# Patient Record
Sex: Female | Born: 1955 | Race: Black or African American | Hispanic: No | Marital: Married | Smoking: Never smoker
Health system: Southern US, Community
[De-identification: ages and names within clinical notes are randomized; demographics above are authoritative.]

## PROBLEM LIST (undated history)

## (undated) DIAGNOSIS — E785 Hyperlipidemia, unspecified: Secondary | ICD-10-CM

## (undated) DIAGNOSIS — I10 Essential (primary) hypertension: Secondary | ICD-10-CM

## (undated) DIAGNOSIS — M1990 Unspecified osteoarthritis, unspecified site: Secondary | ICD-10-CM

## (undated) HISTORY — PX: OTHER SURGICAL HISTORY: SHX169

## (undated) HISTORY — PX: CYST EXCISION: SHX5701

## (undated) HISTORY — PX: REPLACEMENT TOTAL HIP W/  RESURFACING IMPLANTS: SUR1222

---

## 2010-07-06 ENCOUNTER — Ambulatory Visit: Payer: Self-pay | Admitting: Diagnostic Radiology

## 2010-07-06 ENCOUNTER — Emergency Department (HOSPITAL_BASED_OUTPATIENT_CLINIC_OR_DEPARTMENT_OTHER): Admission: EM | Admit: 2010-07-06 | Discharge: 2010-07-06 | Payer: Self-pay | Admitting: Emergency Medicine

## 2010-07-06 IMAGING — CT CT HEAD W/O CM
1 series · 16 of 30 positions shown, 20 images · non-contrast
Comparison: None.

CLINICAL DATA: Dizziness.  Myalgias.

CT HEAD WITHOUT CONTRAST
TECHNIQUE: Contiguous axial images were obtained from the base of
the skull through the vertex without contrast.

[Series 2: head 4.8 h37s · axial · 0.45mm/px · z∈[-113,+27]mm · 16 of 32 slices shown, 20 images]
[im 2/32  brain]
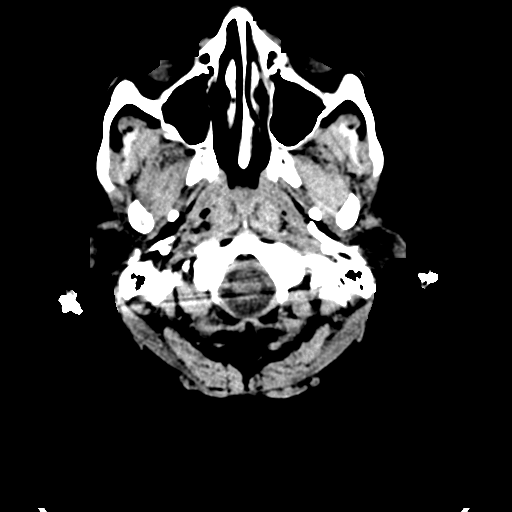
[im 2/32  bone]
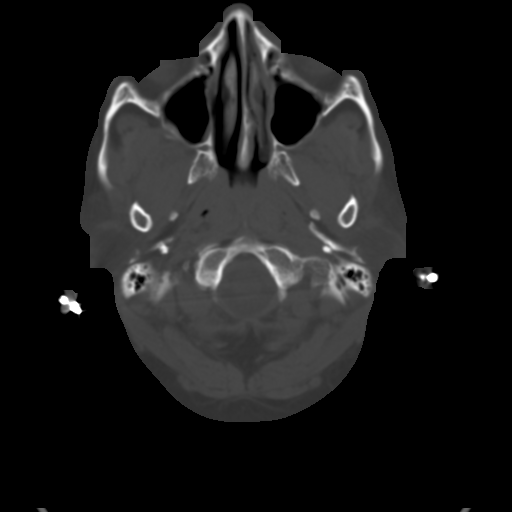
[im 4/32  brain]
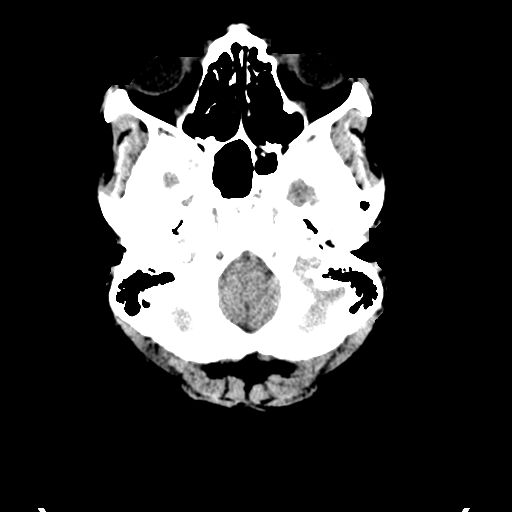
[im 6/32  brain]
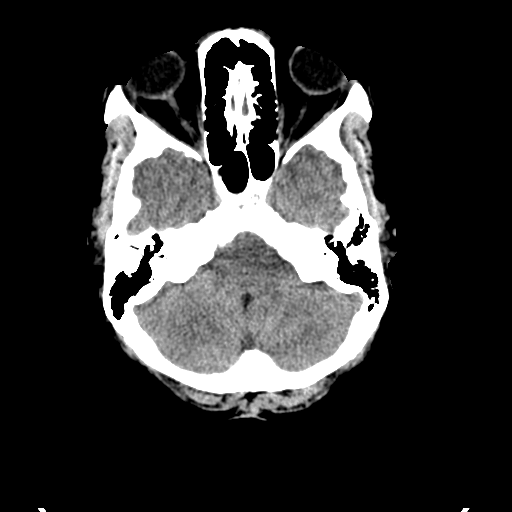
[im 8/32  brain]
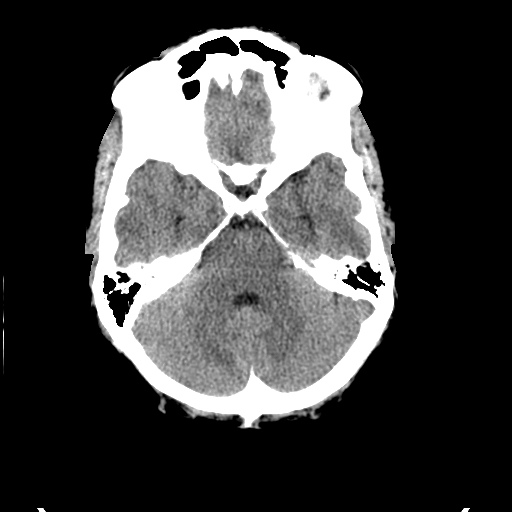
[im 9/32  brain]
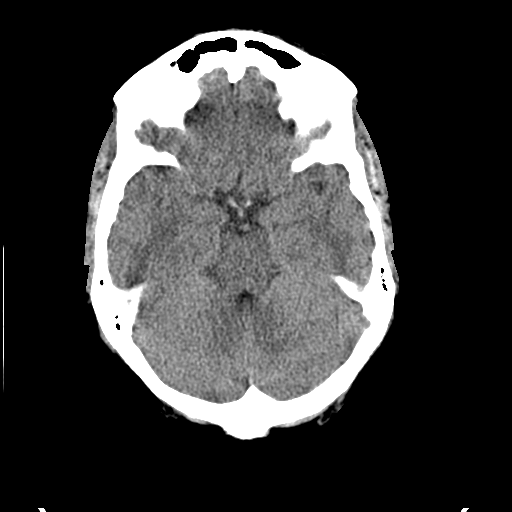
[im 9/32  bone]
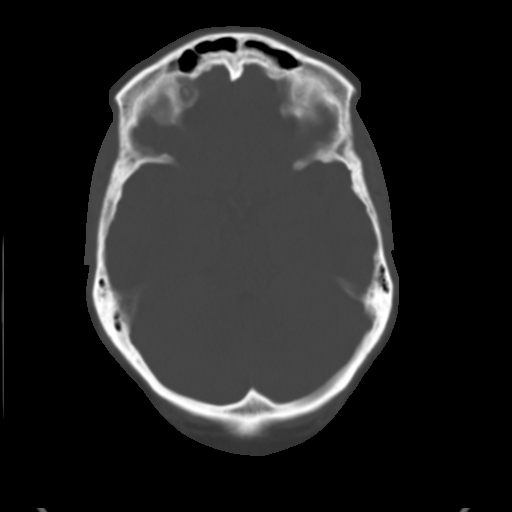
[im 11/32  brain]
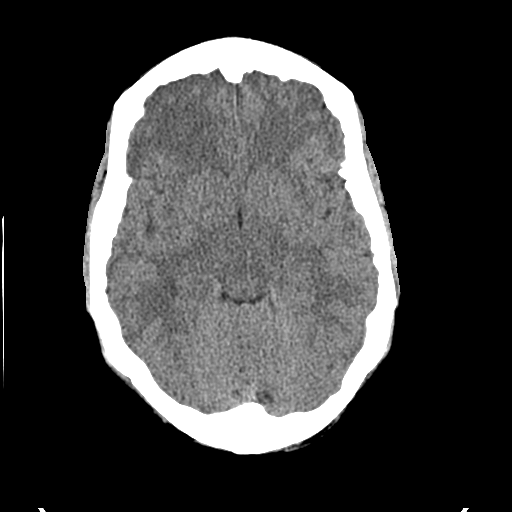
[im 13/32  brain]
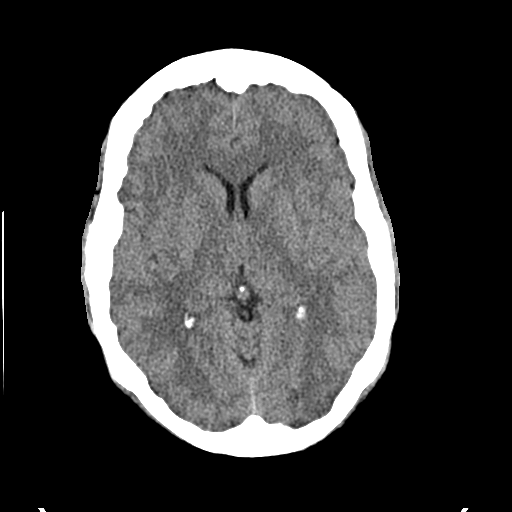
[im 15/32  brain]
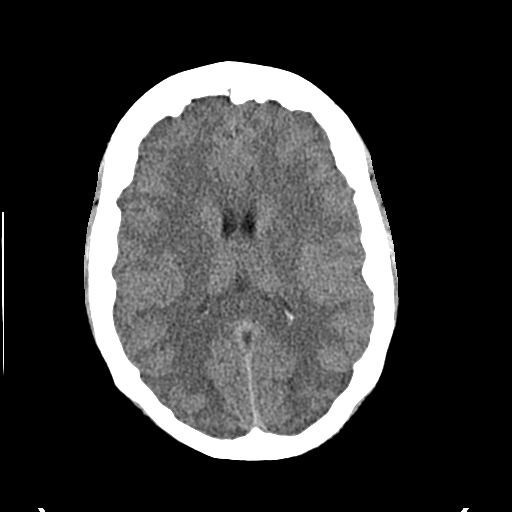
[im 17/32  brain]
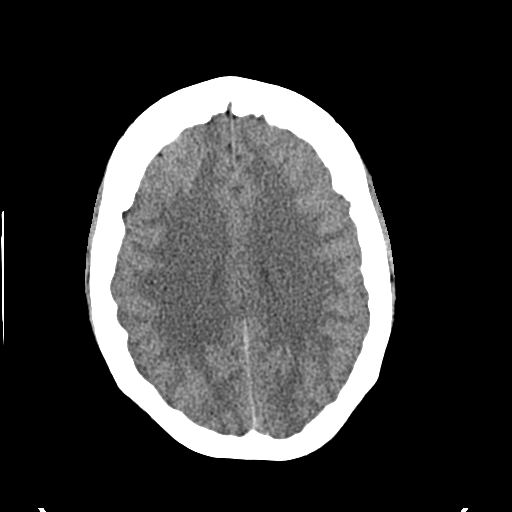
[im 17/32  bone]
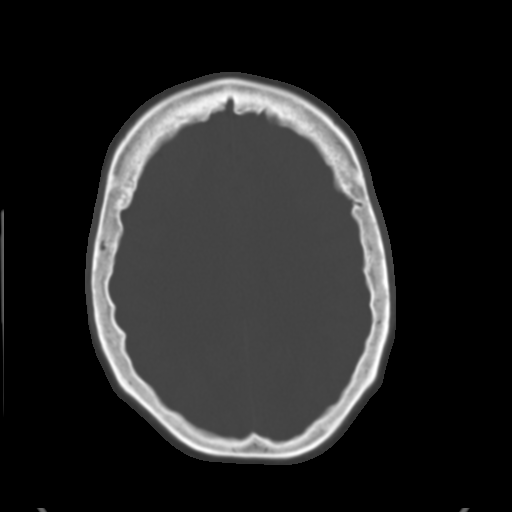
[im 19/32  brain]
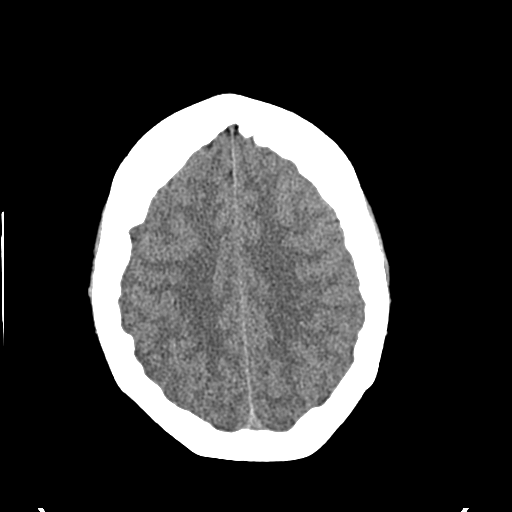
[im 21/32  brain]
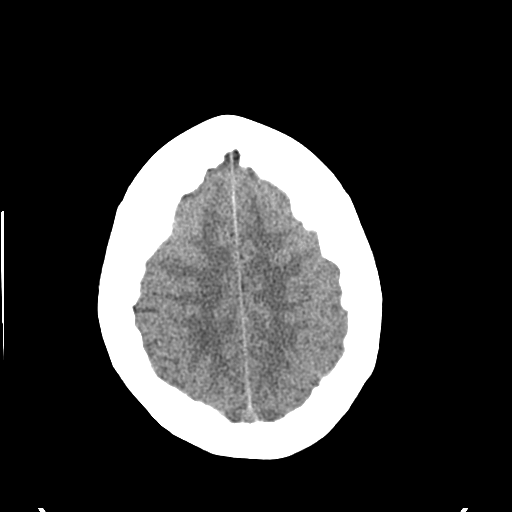
[im 23/32  brain]
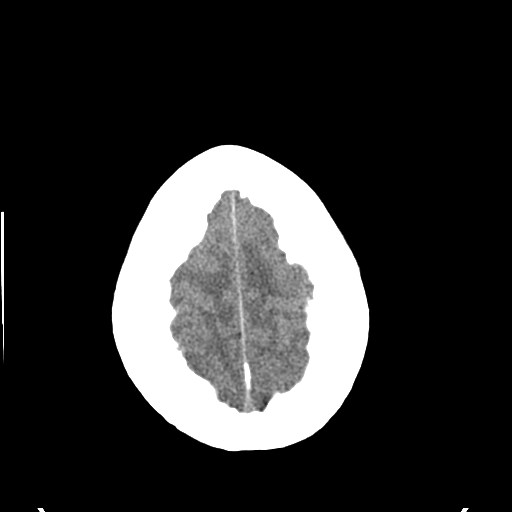
[im 24/32  brain]
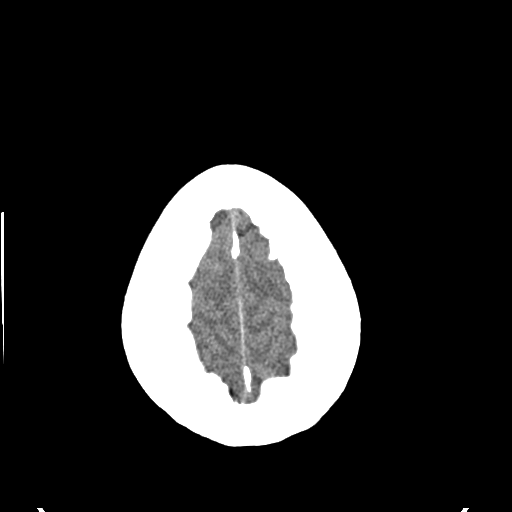
[im 24/32  bone]
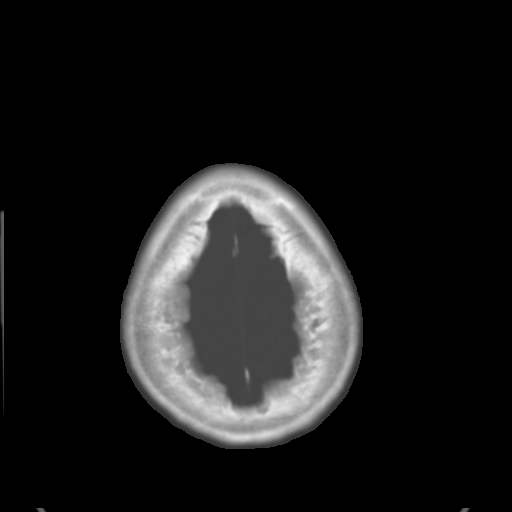
[im 26/32  brain]
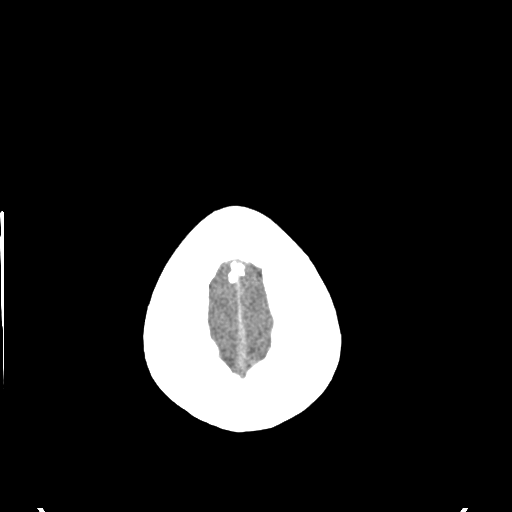
[im 28/32  brain]
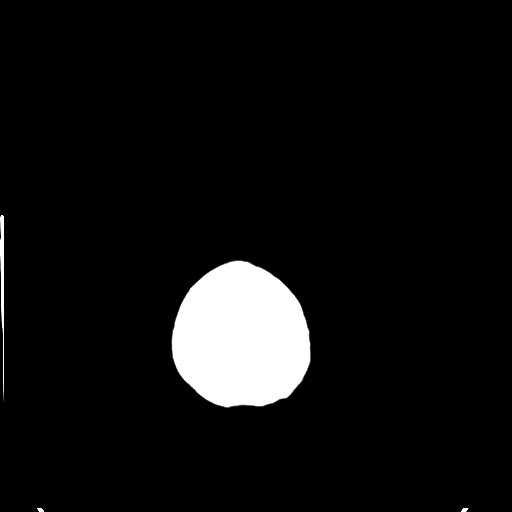
[im 30/32  brain]
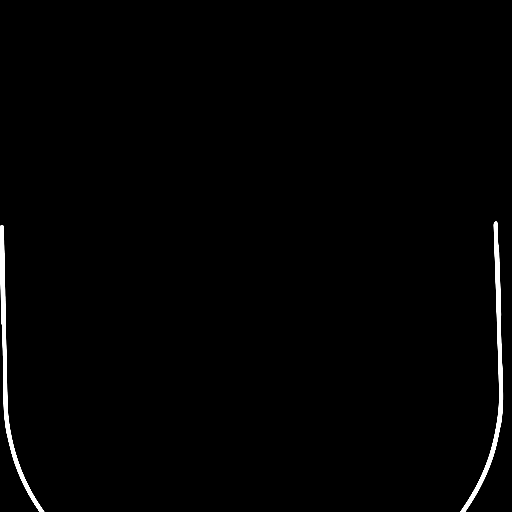

[16 of 30 positions shown; findings below may reference images not displayed]

FINDINGS: Normal appearing cerebral hemispheres and posterior fossa
structures.  Normal size and position of the ventricles.  No
intracranial hemorrhage, mass lesion or CT evidence of acute
infarction.  Mild right maxillary sinus mucosal thickening with a
maximum thickness of 3.0 mm.
IMPRESSION: Mild chronic right maxillary sinusitis.  Otherwise, normal
examination.

## 2010-11-05 LAB — DIFFERENTIAL
Eosinophils Relative: 2 % (ref 0–5)
Lymphocytes Relative: 47 % — ABNORMAL HIGH (ref 12–46)
Lymphs Abs: 4 10*3/uL (ref 0.7–4.0)
Monocytes Absolute: 0.9 10*3/uL (ref 0.1–1.0)
Monocytes Relative: 11 % (ref 3–12)
Neutro Abs: 3.3 10*3/uL (ref 1.7–7.7)

## 2010-11-05 LAB — BASIC METABOLIC PANEL
Chloride: 109 mEq/L (ref 96–112)
GFR calc non Af Amer: >60 mL/min (ref >60)
Potassium: 4.4 mEq/L (ref 3.5–5.1)
Sodium: 145 mEq/L (ref 135–145)

## 2010-11-05 LAB — URINALYSIS, ROUTINE W REFLEX MICROSCOPIC
Bilirubin Urine: NEGATIVE
Leukocytes, UA: NEGATIVE
Nitrite: NEGATIVE
Specific Gravity, Urine: 1.017 (ref 1.005–1.030)
Urobilinogen, UA: 0.2 mg/dL (ref 0.0–1.0)
pH: 6 (ref 5.0–8.0)

## 2010-11-05 LAB — CBC
HCT: 38.1 % (ref 36.0–46.0)
Hemoglobin: 12.8 g/dL (ref 12.0–15.0)
MCV: 90.2 fL (ref 78.0–100.0)
WBC: 8.4 10*3/uL (ref 4.0–10.5)

## 2010-11-05 LAB — POCT CARDIAC MARKERS
CKMB, poc: 1.4 ng/mL (ref 1.0–8.0)
Troponin i, poc: <0.05 ng/mL (ref 0.00–0.09)

## 2010-11-05 LAB — URINE MICROSCOPIC-ADD ON

## 2014-07-13 ENCOUNTER — Non-Acute Institutional Stay (SKILLED_NURSING_FACILITY): Payer: Medicare HMO | Admitting: Internal Medicine

## 2014-07-13 DIAGNOSIS — K5900 Constipation, unspecified: Secondary | ICD-10-CM

## 2014-07-13 DIAGNOSIS — I10 Essential (primary) hypertension: Secondary | ICD-10-CM

## 2014-07-13 DIAGNOSIS — M009 Pyogenic arthritis, unspecified: Secondary | ICD-10-CM

## 2014-07-13 DIAGNOSIS — M67331 Transient synovitis, right wrist: Secondary | ICD-10-CM

## 2014-07-13 DIAGNOSIS — M67339 Transient synovitis, unspecified wrist: Secondary | ICD-10-CM | POA: Insufficient documentation

## 2014-07-13 DIAGNOSIS — M1611 Unilateral primary osteoarthritis, right hip: Secondary | ICD-10-CM

## 2014-07-13 NOTE — Progress Notes (Signed)
Patient ID: Olivia Barnes, female   DOB: 02/08/1956, 58 y.o.   MRN: [id number]    [facility]  Chief Complaint  Patient presents with  . New Admit To SNF   No Known Allergies  HPI 58 y/o female pt is here for STR after hospital admission from 07/05/14-07/12/14 with right hip OA. She underwent right hip replacement on 07/05/14. It got infected and she had removal of infected hip hardware with placement of antibiotic pacer on 07/08/14. She also had synovitis of extensor tendons of right wrist and underwent tenosynovectomy of the fourth dorsal compartment on 07/08/14.  ID was consulted for septic arthritis and she had picc line placed. Her wrist culture grew staph aureus sensitive to cefazolin and she will need it for 6 weeks. She is seen in her room. She is in no distress and denies any complaints. She has PMH of HTN and obesity  Review of Systems  Constitutional: Negative for fever, chills, diaphoresis.  HENT: Negative for congestion.   Eyes: Negative for blurred vision, double vision and discharge.  Respiratory: Negative for cough, sputum production, shortness of breath and wheezing.   Cardiovascular: Negative for chest pain, palpitations, orthopnea and leg swelling.  Gastrointestinal: Negative for heartburn, nausea, vomiting, abdominal pain, diarrhea. Has constipation. mentions miralax helped her at home Genitourinary: Negative for dysuria, urgency, frequency and flank pain.  Musculoskeletal: Negative for back pain, falls Skin: Negative for itching and rash.  Neurological: Negative for dizziness, tingling, focal weakness and headaches.  Psychiatric/Behavioral: Negative for depression and memory loss. The patient is not nervous/anxious.    PMH- HTN    Medication List       This list is accurate as of: 07/13/14  6:59 PM.  Always use your most recent med list.               ceFAZolin 1 g injection  Commonly known as:  ANCEF  2 g by Other  route every 8 (eight) hours.     hydrochlorothiazide 12.5 MG capsule  Commonly known as:  MICROZIDE  Take 12.5 mg by mouth daily.     metoprolol succinate 50 MG 24 hr tablet  Commonly known as:  TOPROL-XL  Take 50 mg by mouth daily. Take with or immediately following a meal.     PERCOCET 5-325 MG per tablet  Generic drug:  oxyCODONE-acetaminophen  Take 1-2 tablets by mouth every 4 (four) hours as needed for severe pain.     warfarin 7.5 MG tablet  Commonly known as:  COUMADIN  Take 7.5 mg by mouth daily.       Physical exam BP 118/65 mmHg  Pulse 78  Temp(Src) 98.2 F (36.8 C)  Resp 18  SpO2 98%  General- adult morbid female in no acute distress Head- atraumatic, normocephalic Eyes- PERRLA, EOMI, no pallor, no icterus, no discharge Neck- no lymphadenopathy Mouth- normal mucus membrane Cardiovascular- normal s1,s2, no murmurs, normal distal pulses Respiratory- bilateral clear to auscultation, no wheeze, no rhonchi, no crackles Abdomen- bowel sounds present, soft, non tender Musculoskeletal- able to move all 4 extremities, right hip ROM limited, right leg trace edema Neurological- no focal deficit Skin- warm and dry, 4 sutures in right wrist, dressing clean, dressing on right hip clean and dry Psychiatry- alert and oriented to person, place and time, normal mood and affect   Labs 07/12/14 hb 8.6, hct 27.2, wbc 9.5, inr 1.66  Assessment/plan  Septic arthritis Continue cefazolin for total of 6 weeks. Has f/u with  ID. Monitor cbc with diff and temp curve  Right hip OA S/p right hip arthroplasty. Will have her work with physical therapy and occupational therapy team to help with gait training and muscle strengthening exercises.fall precautions. Skin care. Encourage to be out of bed. Has f/u with orthopedics Dr Linton Rump.She is NWB to RLE. On coumadin for dvt prophylaxis. Continue percocet prn for pain.  HTN Stable, continue hctz 12.5 mg daily and toprol xl 50 mg  daily  Right wrist synovitis S/p tenosynovectomy. Has suture in place, no signs of infection, continue prn pain med and to follow with ortho  Constipation Add miralax 17 g po qhs prn   Labs- cbc, cmp, esr, crp

## 2014-08-31 ENCOUNTER — Other Ambulatory Visit: Payer: Self-pay | Admitting: *Deleted

## 2014-08-31 MED ORDER — OXYCODONE-ACETAMINOPHEN 5-325 MG PO TABS: ORAL_TABLET | ORAL | Status: DC

## 2014-08-31 NOTE — Telephone Encounter (Signed)
[facility] 

## 2014-09-01 ENCOUNTER — Non-Acute Institutional Stay (SKILLED_NURSING_FACILITY): Payer: Medicare HMO | Admitting: Adult Health

## 2014-09-01 ENCOUNTER — Encounter: Payer: Self-pay | Admitting: Adult Health

## 2014-09-01 DIAGNOSIS — I10 Essential (primary) hypertension: Secondary | ICD-10-CM

## 2014-09-01 DIAGNOSIS — M009 Pyogenic arthritis, unspecified: Secondary | ICD-10-CM

## 2014-09-01 DIAGNOSIS — M1611 Unilateral primary osteoarthritis, right hip: Secondary | ICD-10-CM

## 2014-09-01 DIAGNOSIS — Z7901 Long term (current) use of anticoagulants: Secondary | ICD-10-CM

## 2014-09-01 DIAGNOSIS — M67331 Transient synovitis, right wrist: Secondary | ICD-10-CM

## 2014-09-01 DIAGNOSIS — K5900 Constipation, unspecified: Secondary | ICD-10-CM

## 2014-09-01 NOTE — Progress Notes (Signed)
Patient ID: Olivia Barnes, female   DOB: Apr 17, 1956, 59 y.o.   MRN: [id number]   09/01/2014  Facility:  Nursing Home Location:  [place] Room Number: 1204-P LEVEL OF CARE:  SNF (31)   Chief Complaint  Patient presents with  . Discharge Note    Septic arthritis, osteoarthritis S/P right total hip replacement, transient synovitis of the right wrist, hypertension, constipation and long-term use of anticoagulant    HISTORY OF PRESENT ILLNESS:  This is a 59 year old female who is for discharge home with home health PT and nursing. She has been admitted to [facility] on 03/10/14 from [facility].  She has PMH of hypertension. She has Osteoarthritis S/P right hip replacement. It was was complicated with infection and had removal of infected hip hardware with placement of antibiotic spacer on 07/08/14. Another complication is synovitis is of the extensor tendons of the right wrist S/P extensor tenosynovectomy of the 4th dorsal compartment, right wrist and arthrotomy of the right wrist joint drainage on 07/08/14. Patient was admitted to this facility for short-term rehabilitation after the patient's recent hospitalization.  Patient has completed SNF rehabilitation and therapy has cleared the patient for discharge.  PAST MEDICAL HISTORY:   Hypertension   CURRENT MEDICATIONS: Reviewed per MAR/see medication list  No Known Allergies   REVIEW OF SYSTEMS:  GENERAL: no change in appetite, no fatigue, no weight changes, no fever, chills or weakness RESPIRATORY: no cough, SOB, DOE, wheezing, hemoptysis CARDIAC: no chest pain, or palpitations GI: no abdominal pain, diarrhea, constipation, heart burn, nausea or vomiting  PHYSICAL EXAMINATION  GENERAL: no acute distress, obese SKIN:  Right hip surgical wound is dry, healed; right wrist surgical wound is healed EYES: conjunctivae normal, sclerae normal, normal eye lids NECK: supple, trachea midline,  no neck masses, no thyroid tenderness, no thyromegaly LYMPHATICS: no LAN in the neck, no supraclavicular LAN RESPIRATORY: breathing is even & unlabored, BS CTAB CARDIAC: RRR, no murmur,no extra heart sounds, trace edema RLE GI: abdomen soft, normal BS, no masses, no tenderness, no hepatomegaly, no splenomegaly EXTREMITIES: able to move all 4 extremities; has double lumen PICC on left upper arm PSYCHIATRIC: the patient is alert & oriented to person, affect & behavior appropriate  LABS/RADIOLOGY: 08/28/14  WBC 4.9 hemoglobin 11.2 hematocrit 37.1 MCV 93.2 ESR 13 sodium 140 potassium 4.0 glucose 98 BUN 14 creatinine 0.6 calcium 9.7 total protein 7.0 albumin 3.6 globulin 3.4 total bilirubin 0.4 ALP 78 AST 50 ALT 2 CRP negative 08/22/14  WBC 5.0 hemoglobin 10.9 hematocrit 36.3 MCV 93.1 ESR 73 sodium 140 potassium 4.1 glucose 102 BUN 14 creatinine 0.6 calcium 9.5 total protein 6.9 albumin 3.4 globulin 3.5 ALP 90 AST 15 ALT 4 CRP negative 07/12/14 hb 8.6, hct 27.2, wbc 9.5, inr 1.66   ASSESSMENT/PLAN:  Septic arthritis -  continue cefazolin until today, 09/01/14, then discontinue PICC Right hip Osteoarthritis S/P right hip arthroplasty - for home health PT and nursing; continue Percocet 5/325 mg 1-2 tabs by mouth every 4 hours when necessary; follow-up. [facility] infectious disease Right wrist synovitis -  S/P tenosynovectomy; continue Percocet when necessary Hypertension - well controlled; continue Toprol-XL 50 mg by mouth daily and Microzide 12.5 mg by mouth daily Long-term use of anticoagulant - INR 2.2 - 50; continue Coumadin 7 mg by mouth daily and INR checked on 09/05/14 Constipation - continue MiraLAX when necessary   I have filled out patient's discharge paperwork and written prescriptions.  Patient will receive  home health PT and Nursing.  Total discharge time: Greater than 30 minutes  Discharge time involved coordination of the discharge process with social worker, nursing staff and  therapy department. Medical justification for home health services verified.     [facility], NP [employer] [phone]

## 2014-10-20 ENCOUNTER — Non-Acute Institutional Stay (SKILLED_NURSING_FACILITY): Payer: No Typology Code available for payment source | Admitting: Internal Medicine

## 2014-10-20 DIAGNOSIS — I10 Essential (primary) hypertension: Secondary | ICD-10-CM | POA: Diagnosis not present

## 2014-10-20 DIAGNOSIS — K5900 Constipation, unspecified: Secondary | ICD-10-CM | POA: Diagnosis not present

## 2014-10-20 DIAGNOSIS — M1990 Unspecified osteoarthritis, unspecified site: Secondary | ICD-10-CM

## 2014-10-20 DIAGNOSIS — M1611 Unilateral primary osteoarthritis, right hip: Secondary | ICD-10-CM | POA: Insufficient documentation

## 2014-10-20 DIAGNOSIS — D62 Acute posthemorrhagic anemia: Secondary | ICD-10-CM

## 2014-10-20 NOTE — Progress Notes (Addendum)
Patient ID: Olivia Barnes, female   DOB: [birth year], 59 y.o.   MRN: [id number]     [facility]   PCP: No primary care provider on file.  Code Status: full code  No Known Allergies  Chief Complaint  Patient presents with  . 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 she underwent revision of right total hip replacement. She is seen in her room today. She denies any concerns. Her current pain regimen is helping her pain. Her last bowel movement was yesterday. No new concern from staff.  Review of Systems:  Constitutional: Negative for fever, chills, malaise/fatigue and diaphoresis.  HENT: Negative for headache, congestion, nasal discharge Eyes: Negative for eye pain, blurred vision, double vision and discharge.  Respiratory: Negative for cough, shortness of breath and wheezing.   Cardiovascular: Negative for chest pain, palpitations, leg swelling.  Gastrointestinal: Negative for heartburn, nausea, vomiting, abdominal pain Genitourinary: Negative for dysuria, urgency, frequency, hematuria, incontinence, nocturia and flank pain.  Musculoskeletal: Negative for back pain, falls Skin: Negative for itching, rash.  Neurological: Negative for dizziness, tingling, focal weakness Psychiatric/Behavioral: Negative for depression  PMH: HTN  Surgical history: arm surgery  Social History: denies smoking and alcohol intake  Family history: heart disease  Medications: Patient's Medications  New Prescriptions   No medications on file  Previous Medications   HYDROCHLOROTHIAZIDE (MICROZIDE) 12.5 MG CAPSULE    Take 12.5 mg by mouth daily.   METOPROLOL SUCCINATE (TOPROL-XL) 50 MG 24 HR TABLET    Take 50 mg by mouth daily. Take with or immediately following a meal.   OXYCODONE-ACETAMINOPHEN  (PERCOCET) 5-325 MG PER TABLET    Take one tablet by mouth every 4 hours as needed for moderate pain; Take two tablets by mouth every 4 hours as needed for severe pain. Do not exceed 4gm of Tylenol in 24 hours   POLYETHYLENE GLYCOL (MIRALAX / GLYCOLAX) PACKET    Take 17 g by mouth daily.   WARFARIN (COUMADIN) 7.5 MG TABLET    Take 15 mg by mouth daily.   Modified Medications   No medications on file  Discontinued Medications   CEFAZOLIN (ANCEF) 1 G INJECTION    2 g by Other route every 8 (eight) hours.     Physical Exam: Filed Vitals:   10/20/14 0943  BP: 118/64  Pulse: 80  Temp: 99.1 F (37.3 C)  Resp: 18  Weight: 210 lb (95.255 kg)  SpO2: 98%    General- adult female, obese, in no acute distress Head- normocephalic, atraumatic Throat- moist mucus membrane Eyes- PERRLA, EOMI, no pallor, no icterus, no discharge, normal conjunctiva, normal sclera Neck- no cervical lymphadenopathy Cardiovascular- normal s1,s2, no murmurs, palpable dorsalis pedis and radial pulses, right leg edema Respiratory- bilateral clear to auscultation, no wheeze, no rhonchi, no crackles, no use of accessory muscles Abdomen- bowel sounds present, soft, non tender Musculoskeletal- able to move all 4 extremities, right hip ROM limited Neurological- no focal deficit Skin- warm and dry, right hip surgical incision healing well, staples in place and dressing clean and dry Nails- hypertrophy, ingrown Psychiatry- alert and oriented to person, place and time, normal mood and affect  Labs 10/19/14 wbc 7.2, hb 7.7, hct 24.6, plt 134, na 140, k 4.2, bun 15, cr 0.67, ca 8.2  Assessment/Plan  Right hip arthritis S/p right hip  revision arthroplasty. Currently NWB in RLE. Will have patient work with PT/OT as tolerated to regain strength and restore function.  Fall precautions are in place. Continue percocet 5-325 1 tab q4h prn pain. Continue coumadin for dvt prophylaxis, inr today 1.6, continue coumadin 15 mg daily with  recheck of inr 10/23/14  HTN bp reading stable, continue hctz 12.5 mg daily and toprol xl 50 mg daily, monitor bp  Constipation On miralax daily, add senna s 2 tab daily and prn dulcolax suppository  Anemia likely post op from blood loss anemia, monitor h&h   Goals of care: short term rehabilitation   Labs/tests ordered: cbc, cmp 10/23/14  Family/ staff Communication: reviewed care plan with patient and nursing supervisor    Oneal GroutMAHIMA Naol Ontiveros, MD  [facility] [phone] (Monday-Friday 8 am - 5 pm) [phone] (afterhours)

## 2014-10-27 ENCOUNTER — Encounter: Payer: Self-pay | Admitting: Adult Health

## 2014-10-27 ENCOUNTER — Non-Acute Institutional Stay (SKILLED_NURSING_FACILITY): Payer: No Typology Code available for payment source | Admitting: Adult Health

## 2014-10-27 DIAGNOSIS — Z7901 Long term (current) use of anticoagulants: Secondary | ICD-10-CM

## 2014-10-27 DIAGNOSIS — Z9660 Presence of unspecified orthopedic joint implant: Secondary | ICD-10-CM

## 2014-10-27 DIAGNOSIS — Z96649 Presence of unspecified artificial hip joint: Secondary | ICD-10-CM

## 2014-10-27 NOTE — Progress Notes (Signed)
Patient ID: Olivia Barnes, female   DOB: 01/30/1956, 59 y.o.   MRN: [id number] Subjective:     Indication: S/P right hip revision arthroplasty Bleeding signs/symptoms: None Thromboembolic signs/symptoms: None  Missed Coumadin doses: This week - 4 due to supratherapeutic INR Medication changes: no Dietary changes: no Bacterial/viral infection: no Other concerns: no     Review of Systems A comprehensive review of systems was negative.   Objective:    INR Today: 2.3 Current dose:   on hold X 4 days  Assessment:    Therapeutic INR for goal of 2-3   Plan:    1. New dose: Coumadin 10 mg PO Q D   2. Next INR:  10/31/14

## 2014-11-13 ENCOUNTER — Non-Acute Institutional Stay (SKILLED_NURSING_FACILITY): Payer: No Typology Code available for payment source | Admitting: Adult Health

## 2014-11-13 DIAGNOSIS — D62 Acute posthemorrhagic anemia: Secondary | ICD-10-CM | POA: Diagnosis not present

## 2014-11-13 DIAGNOSIS — K5900 Constipation, unspecified: Secondary | ICD-10-CM

## 2014-11-13 DIAGNOSIS — M009 Pyogenic arthritis, unspecified: Secondary | ICD-10-CM | POA: Diagnosis not present

## 2014-11-13 DIAGNOSIS — I10 Essential (primary) hypertension: Secondary | ICD-10-CM | POA: Diagnosis not present

## 2014-11-21 ENCOUNTER — Encounter: Payer: Self-pay | Admitting: Adult Health

## 2014-11-21 NOTE — Progress Notes (Signed)
Patient ID: Olivia Barnes, female   DOB: 07/17/1956, 59 y.o.   MRN: [id number]   11/13/14  Facility:  Nursing Home Location:  [facility] Room Number: 507-P LEVEL OF CARE:  SNF (31)   Chief Complaint  Patient presents with  . Discharge Note    Infected failed right hip replacement S/P right total hip revision arthroplasty, hypertension, anemia and constipation    HISTORY OF PRESENT ILLNESS:  This is a 59 year old female who is for discharge home and will have outpatient rehabilitation. She has PMH of hypertension. [facility]. 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 She had undergone revision of right total hip replacement.  Patient was admitted to this facility for short-term rehabilitation after the patient's recent hospitalization.  Patient has completed SNF rehabilitation and therapy has cleared the patient for discharge.   PAST MEDICAL HISTORY:  Hypertension  CURRENT MEDICATIONS: Reviewed per MAR/see medication list  No Known Allergies   REVIEW OF SYSTEMS:  GENERAL: no change in appetite, no fatigue, no weight changes, no fever, chills or weakness RESPIRATORY: no cough, SOB, DOE, wheezing, hemoptysis CARDIAC: no chest pain, edema or palpitations GI: no abdominal pain, diarrhea, constipation, heart burn, nausea or vomiting  PHYSICAL EXAMINATION  GENERAL: no acute distress, obese EYES: conjunctivae normal, sclerae normal, normal eye lids NECK: supple, trachea midline, no neck masses, no thyroid tenderness, no thyromegaly LYMPHATICS: no LAN in the neck, no supraclavicular LAN RESPIRATORY: breathing is even & unlabored, BS CTAB CARDIAC: RRR, no murmur,no extra heart sounds, no edema GI: abdomen soft, normal BS, no masses, no tenderness, no hepatomegaly, no splenomegaly EXTREMITIES:  Able to move 4 extremities; + right knee  immobilizer PSYCHIATRIC: the is alert & oriented to person, affect & behavior appropriate  LABS/RADIOLOGY: 10/23/14  WBC 5.4 hemoglobin 8.3 hematocrit 27.1 MCV 88.9 sodium 140 potassium 4.3 glucose 89 BUN 15 creatinine 0.66 alkaline phosphatase 85 SGOT 16 SGPT 9 total protein 6.3 albumin 3.3 calcium 8.8 10/19/14 wbc 7.2, hb 7.7, hct 24.6, plt 134, na 140, k 4.2, bun 15, cr 0.67, ca 8.2  ASSESSMENT/PLAN:  Infected failed right hip replacement S/P revision of right total hip replacement - for outpatient rehabilitation; continue Percocet 5/325 mg 1 tab by mouth every 4 hours when necessary for pain; and coumadin was recently discontinued Hypertension - well controlled; continue microzide 12.5 mg 1 capsule by mouth daily and Toprol-XL 50 mg by mouth daily Anemia - hemoglobin 8.3; stable Constipation - continue MiraLAX 17 g last 4-6 ounces liquid by mouth daily and senna S2 tabs by mouth daily    I have filled out patient's discharge paperwork and written prescriptions.  Patient will have outpatient rehabilitation.  Total discharge time: Less than 30 minutes  Discharge time involved coordination of the discharge process with Child psychotherapistsocial worker, nursing staff and therapy department.    [facility],Trisa Cranor, NP [facility] [phone]

## 2015-02-28 ENCOUNTER — Other Ambulatory Visit: Payer: Self-pay | Admitting: Adult Health

## 2015-03-07 ENCOUNTER — Other Ambulatory Visit: Payer: Self-pay | Admitting: Adult Health

## 2015-03-08 ENCOUNTER — Other Ambulatory Visit: Payer: Self-pay | Admitting: Adult Health

## 2016-03-19 NOTE — Progress Notes (Signed)
[name] [address] [place], [place] [address] Phone: [phone] Subjective:    I'm seeing this patient by the request  of:    CC: hip pain  EFE:[id number]  Olivia Barnes is a 60 y.o. female coming in with complaint of right-sided hip pain. 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 This is an 2016 over a year ago. Patient unfortunately continues to have right hip pain. Patient states that it seems comfortable lower back and radiate down the leg. Symptoms goes to the knee. Patient states it seems will be worse with increasing activity. Patient states he can be very painful at night and has not been sleeping well for a long time. Mild weakness.      No past medical history on file. No past surgical history on file. Social History   Social History  . Marital status: Married    Spouse name: N/A  . Number of children: N/A  . Years of education: N/A   Social History Main Topics  . Smoking status: Unknown If Ever Smoked  . Smokeless tobacco: None  . Alcohol use None  . Drug use: Unknown  . Sexual activity: Not Asked   Other Topics Concern  . None   Social History Narrative  . None   No Known Allergies No family history on file. no family hx.    Past medical history, social, surgical and family history all reviewed in electronic medical record.  No pertanent information unless stated regarding to the chief complaint.   Review of Systems: No headache, visual changes, nausea, vomiting, diarrhea, constipation, dizziness, abdominal pain, skin rash, fevers, chills, night sweats, weight loss, swollen lymph nodes, chest pain, shortness of breath, mood changes.   Objective  Blood pressure 128/82, pulse 61, height 5\' 3"  (1.6 m), weight 238 lb (108 kg), SpO2 99 %.  General: No apparent distress  alert and oriented x3 mood and affect normal, dressed appropriately.  HEENT: Pupils equal, extraocular movements intact  Respiratory: Patient's speak in full sentences and does not appear short of breath  Cardiovascular: No lower extremity edema, non tender, no erythema  Skin: Warm dry intact with no signs of infection or rash on extremities or on axial skeleton.  Abdomen: Soft nontender  Neuro: Cranial nerves II through XII are intact, neurovascularly intact in all extremities with 2+ DTRs and 2+ pulses.  Lymph: No lymphadenopathy of posterior or anterior cervical chain or axillae bilaterally.  Gait normal with good balance and coordination.  MSK:  Non tender with full range of motion and good stability and symmetric strength and tone of shoulders, elbows, wrist,  knee and ankles bilaterally.  Back Exam:  Inspection: Unremarkable  Motion: Flexion 30 deg, Extension 25 deg, Side Bending to 25 deg bilaterally,  Rotation to 25 deg bilaterally  SLR laying: positive right  XSLR laying: Negative  Palpable tenderness: Severe TTP in the paraspinal msk on right FABER: negative. Sensory change: Gross sensation intact to all lumbar and sacral dermatomes.  Reflexes: 2+ at both patellar tendons, 2+ at achilles tendons, Babinski's downgoing.  Strength at foot  Plantar-flexion: 5/5 Dorsi-flexion: 5/5 Eversion: 5/5 Inversion: 5/5  Leg strength  Quad: 5/5 Hamstring: 5/5 Hip flexor: 5/5 Hip abductors: 5/5  Gait unremarkable.  TGP:QDIYM ROM IR: 15 Deg, ER: 35 Deg, Flexion: 100 Deg, Extension: 60 Deg,  Abduction: 25 Deg, Adduction: 25 Deg Strength 4/5 in all panes Pelvic alignment unremarkable to inspection and palpation. Greater trochanter TTP moderate No tenderness over piriformis and greater trochanter. Decrease ROM  Pain over the right SI joint Contralateral hip unremarkable.   Procedure note 97110; 15 minutes spent for Therapeutic exercises as stated in above notes.  This included exercises  focusing on stretching, strengthening, with significant focus on eccentric aspects.  Low back exercises that included:  Pelvic tilt/bracing instruction to focus on control of the pelvic girdle and lower abdominal muscles  Glute strengthening exercises, focusing on proper firing of the glutes without engaging the low back muscles Proper stretching techniques for maximum relief for the hamstrings, hip flexors, low back and some rotation where tolerated  Proper technique shown and discussed handout in great detail with ATC.  All questions were discussed and answered.      Impression and Recommendations:     This case required medical decision making of moderate complexity.      Note: This dictation was prepared with Dragon dictation along with smaller phrase technology. Any transcriptional errors that result from this process are unintentional.

## 2016-03-20 ENCOUNTER — Ambulatory Visit (INDEPENDENT_AMBULATORY_CARE_PROVIDER_SITE_OTHER)
Admission: RE | Admit: 2016-03-20 | Discharge: 2016-03-20 | Disposition: A | Discharge status: Home / Self Care | Payer: Managed Care, Other (non HMO) | Source: Ambulatory Visit | Attending: Family Medicine | Admitting: Family Medicine

## 2016-03-20 ENCOUNTER — Ambulatory Visit (INDEPENDENT_AMBULATORY_CARE_PROVIDER_SITE_OTHER): Payer: Managed Care, Other (non HMO) | Admitting: Family Medicine

## 2016-03-20 ENCOUNTER — Encounter: Payer: Self-pay | Admitting: Family Medicine

## 2016-03-20 VITALS — BP 128/82 | HR 61 | Ht 63.0 in | Wt 238.0 lb

## 2016-03-20 DIAGNOSIS — M5416 Radiculopathy, lumbar region: Secondary | ICD-10-CM | POA: Diagnosis not present

## 2016-03-20 IMAGING — DX DG LUMBAR SPINE COMPLETE 4+V
5 series · 5 of 5 positions shown · non-contrast
Comparison: [DATE].

CLINICAL DATA: Low back pain. No known injury. Initial evaluation .

EXAM:
LUMBAR SPINE - COMPLETE 4+ VIEW

[l-spine ap]
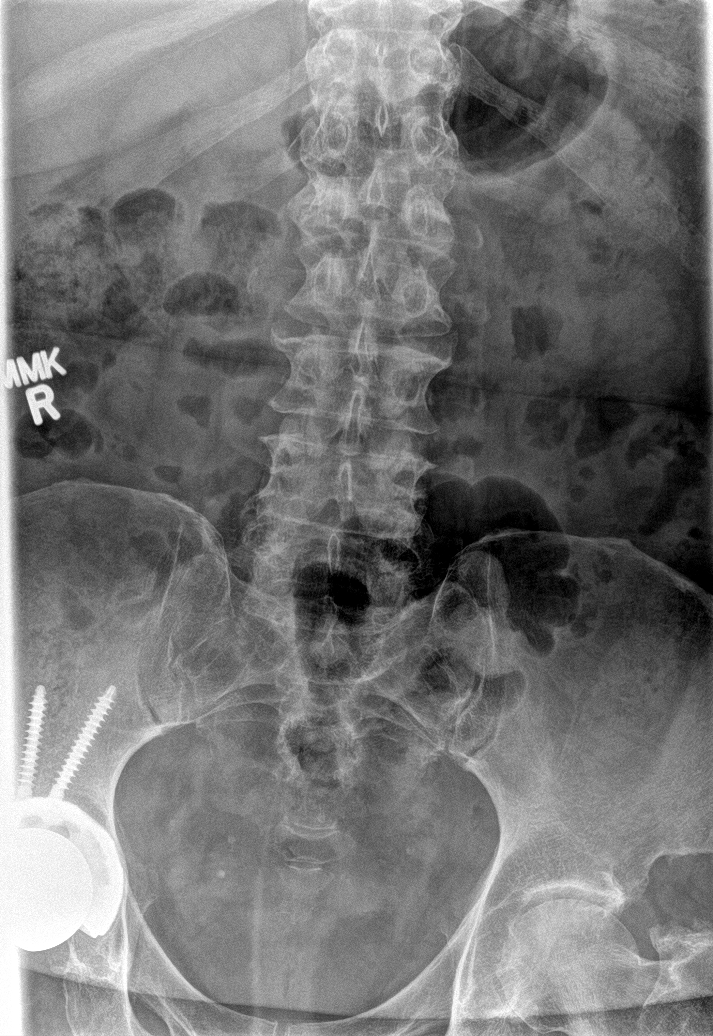

[l-spine obl (1 of 2)]
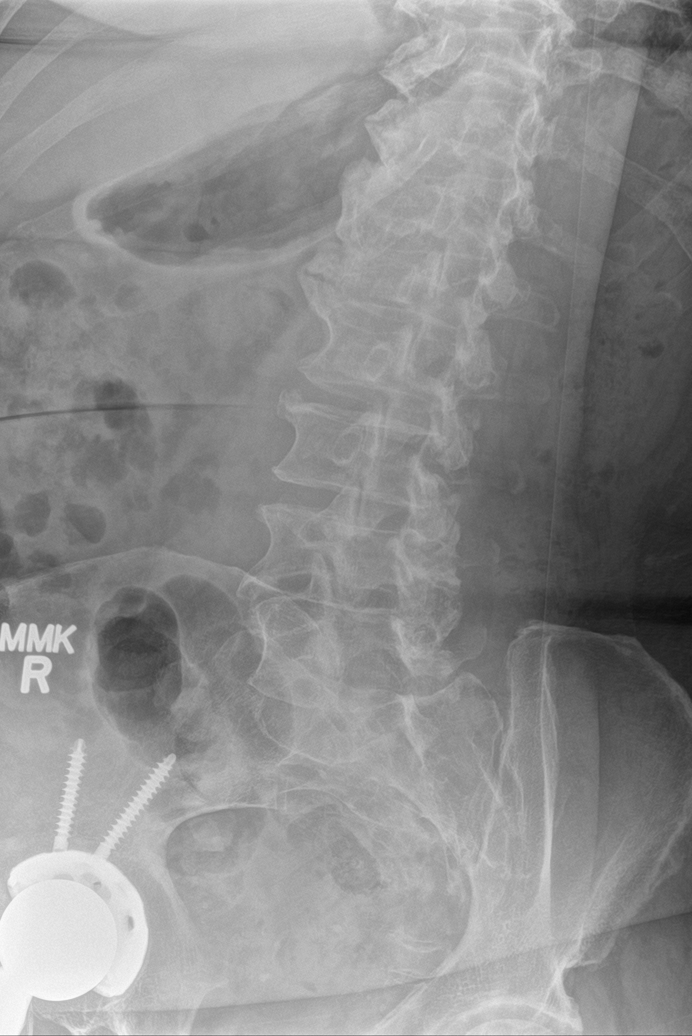

[l-spine obl (2 of 2)]
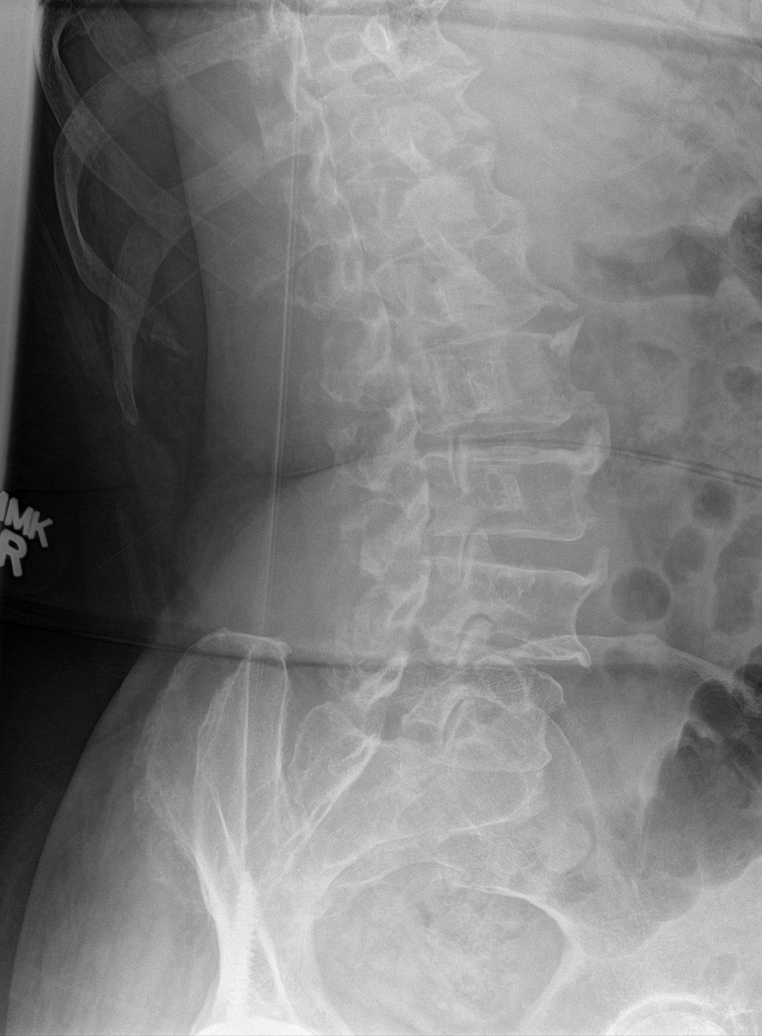

[l-spine lat]
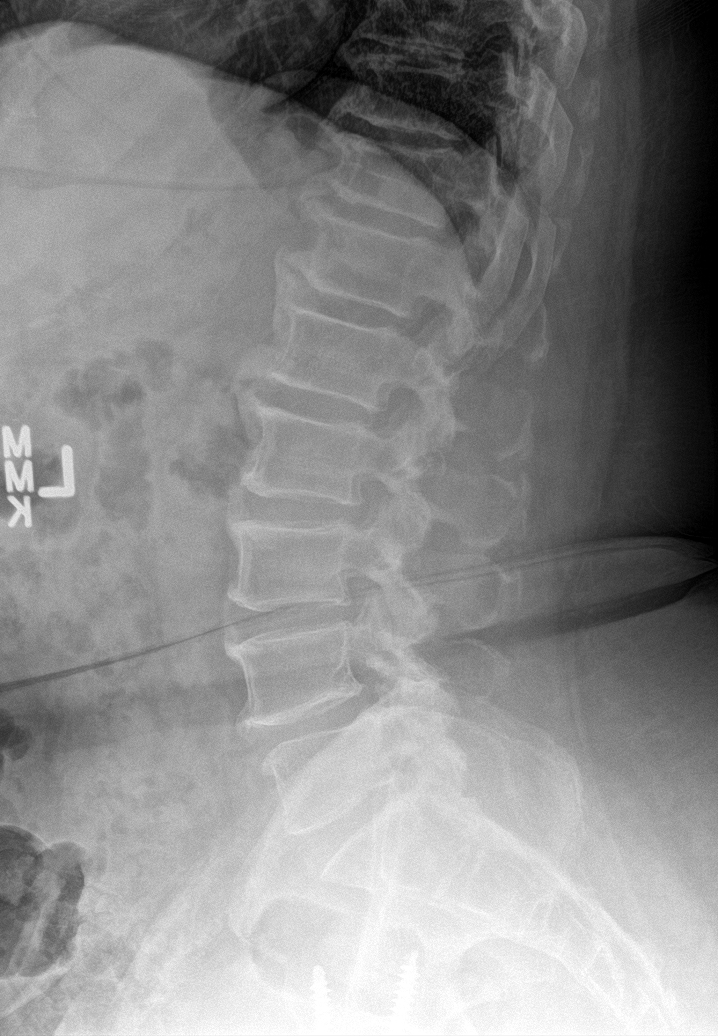

[l-spine spot]
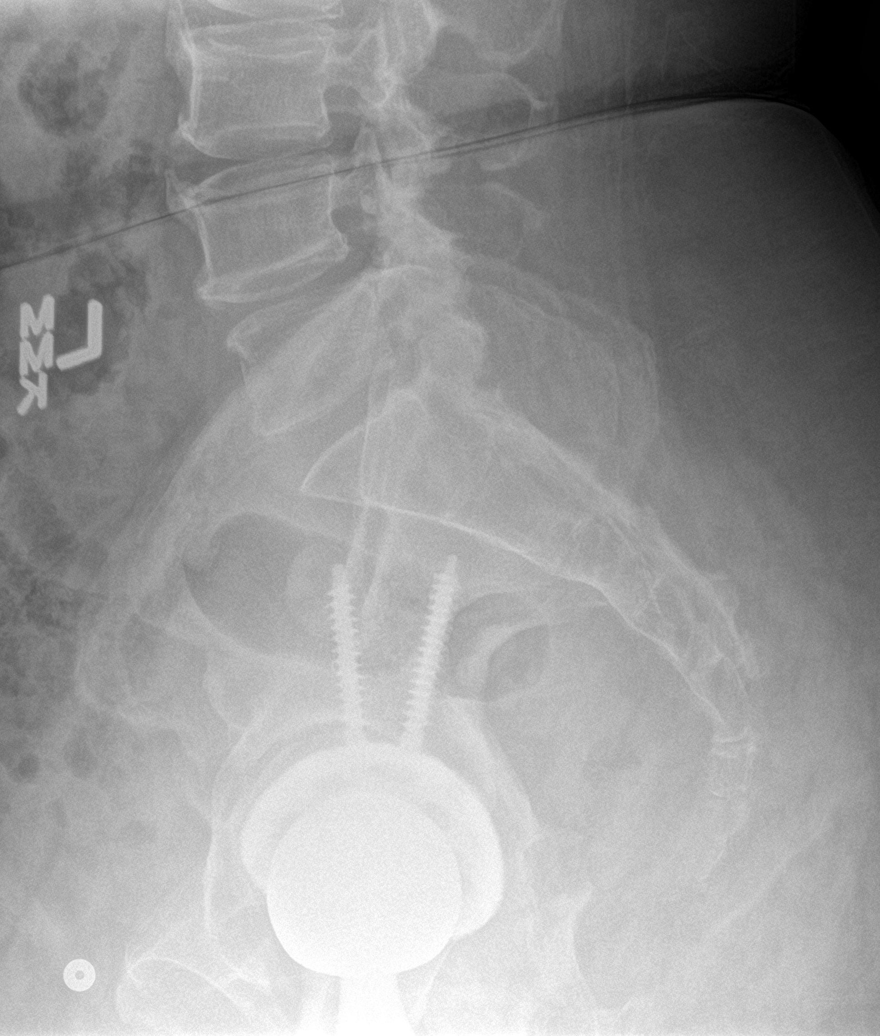

[5 of 5 positions shown; findings below may reference images not displayed]

FINDINGS: Multilevel degenerative change. No acute abnormality identified.
Normal alignment mineralization. Pelvic calcifications consistent
phleboliths. Total right hip replacement .
IMPRESSION: Multilevel diffuse degenerative change. Scoliosis concave right. No
acute abnormality.

## 2016-03-20 MED ORDER — GABAPENTIN 100 MG PO CAPS: 200.0000 mg | ORAL_CAPSULE | Freq: Every day | ORAL | 3 refills | Status: DC

## 2016-03-20 MED ORDER — DICLOFENAC SODIUM 2 % TD SOLN: TRANSDERMAL | 3 refills | Status: DC

## 2016-03-20 NOTE — Assessment & Plan Note (Signed)
Likely L3-4 on right Leg length as well.  Discussed home exercises, topical anti-inflammatories, gabapentin and we are getting x-rays. Patient will come back in 4 weeks.

## 2016-03-20 NOTE — Patient Instructions (Signed)
Good to see you  I think you have had enough surgery Ice 20 minutes 2 times daily. Usually after activity and before bed. Exercises 3 times a week.  Xray downstairs today  Gabapentin 200mg  at night pennsaid pinkie amount topically 2 times daily as needed.  Vitamin D 2000 iU daily  Turmeric 500mg  twice daily  Tart cherry extract andy dose at night Put an extra insole in your right shoe only to help wihth  The leg length See me again in 4 weeks

## 2016-04-19 NOTE — Progress Notes (Signed)
[name] [address] [place], [place] [address] Phone: [phone] Subjective:    CC: hip pain f/u   BJY:NWGNFAOZHY  Olivia Barnes is a 60 y.o. female coming in with complaint of right-sided hip pain. 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 2016 Was concern for worsening pain more lateral hip and back.   Xray taken at last exam showed DDD at multiple levels with scoliosis.   Patient was to start HEP, ice protocol, avoid certain activity.  Patient states She is approximately 75% better. Patient states that she is making some progress. Patient states that sometimes she has intermittent pain in the left groin area. Still having some mild pain on the lateral aspects of the hip. Overall though does seem like she is doing better. No side effects to the medications. Gabapentin has helped her sleep some.    No past medical history on file. No past surgical history on file. Social History   Social History  . Marital status: Married    Spouse name: N/A  . Number of children: N/A  . Years of education: N/A   Social History Main Topics  . Smoking status: Unknown If Ever Smoked  . Smokeless tobacco: None  . Alcohol use None  . Drug use: Unknown  . Sexual activity: Not Asked   Other Topics Concern  . None   Social History Narrative  . None   No Known Allergies No family history on file. no family hx.    Past medical history, social, surgical and family history all reviewed in electronic medical record.  No pertanent information unless stated regarding to the chief complaint.   Review of Systems: No headache, visual changes, nausea, vomiting, diarrhea, constipation, dizziness, abdominal pain, skin rash, fevers, chills, night sweats, weight loss, swollen lymph nodes, chest pain, shortness of  breath, mood changes.   Objective  Blood pressure 130/80, pulse 68, weight 239 lb (108.4 kg), SpO2 98 %.  General: No apparent distress alert and oriented x3 mood and affect normal, dressed appropriately.  HEENT: Pupils equal, extraocular movements intact  Respiratory: Patient's speak in full sentences and does not appear short of breath  Cardiovascular: No lower extremity edema, non tender, no erythema  Skin: Warm dry intact with no signs of infection or rash on extremities or on axial skeleton.  Abdomen: Soft nontender  Neuro: Cranial nerves II through XII are intact, neurovascularly intact in all extremities with 2+ DTRs and 2+ pulses.  Lymph: No lymphadenopathy of posterior or anterior cervical chain or axillae bilaterally.  Gait mild antalgic gait MSK:  Non tender with full range of motion and good stability and symmetric strength and tone of shoulders, elbows, wrist,  knee and ankles bilaterally.  Back Exam:  Inspection: Unremarkable  Motion: Flexion 30 deg, Extension 25 deg, Side Bending to 25 deg bilaterally,  Rotation to 25 deg bilaterally  SLR laying: Significant tightness of the hamstrings bilaterally with worsening pain on the right side XSLR laying: Negative  Palpable tenderness: Moderate tenderness of the per spinal musculature on the right side of the lumbar spine. FABER: negative. Sensory change: Gross sensation intact to all lumbar and sacral dermatomes.  Reflexes: 2+ at both patellar tendons, 2+ at achilles tendons, Babinski's downgoing.  Strength at foot  Plantar-flexion: 5/5 Dorsi-flexion: 5/5 Eversion: 5/5 Inversion: 5/5  Leg  strength  Quad: 5/5 Hamstring: 5/5 Hip flexor: 5/5 Hip abductors: 5/5  Gait unremarkable.  NWG:NFAOZHip:right ROM IR: 15 Deg, ER: 35 Deg, Flexion: 100 Deg, Extension: 60 Deg, Abduction: 25 Deg, Adduction: 25 Deg Strength 4/5 in all panes Pelvic alignment unremarkable to inspection and palpation. Greater trochanter still minorly tender to  palpation Mild discomfort over the right piriformis Decrease ROM  Pain over the right SI joint Contralateral hip shows decreased range of motion with internal rotation of 15..        Impression and Recommendations:     This case required medical decision making of moderate complexity.      Note: This dictation was prepared with Dragon dictation along with smaller phrase technology. Any transcriptional errors that result from this process are unintentional.

## 2016-04-21 ENCOUNTER — Ambulatory Visit (INDEPENDENT_AMBULATORY_CARE_PROVIDER_SITE_OTHER): Payer: Managed Care, Other (non HMO) | Admitting: Family Medicine

## 2016-04-21 ENCOUNTER — Encounter: Payer: Self-pay | Admitting: Family Medicine

## 2016-04-21 DIAGNOSIS — M1611 Unilateral primary osteoarthritis, right hip: Secondary | ICD-10-CM | POA: Diagnosis not present

## 2016-04-21 DIAGNOSIS — M5416 Radiculopathy, lumbar region: Secondary | ICD-10-CM | POA: Diagnosis not present

## 2016-04-21 MED ORDER — GABAPENTIN 300 MG PO CAPS: 300.0000 mg | ORAL_CAPSULE | Freq: Every day | ORAL | 1 refills | Status: DC

## 2016-04-21 NOTE — Assessment & Plan Note (Signed)
Status post replacement.

## 2016-04-21 NOTE — Patient Instructions (Signed)
You are doing great  Increase gabapentin to 300mg  at night, new prescription sent in.  Ice when you need it.  Tylenol 325 mg 3 times a day schedule can keep the pain away and less severe.  Pennsaid when you need it.  Try to lift overhand and with thumbs up or tape the wrist and see if that helps See me again in 6-8 weeks and if not better we will consider injection of the hip or MRI of you back.

## 2016-04-21 NOTE — Assessment & Plan Note (Signed)
Patient discontinued have some mild radicular symptoms. Patient will increase her gabapentin. Non-slowing her down and no significant weakness. Patient knows if any weakness or numbness occurs or any bowel or bladder incontinence to seek medical attention immediately. We discussed continuing the same regimen at this time. Has topical anti-inflammatories as needed. We discussed icing regimen. Patient and will come back and see me again in 6-8 weeks for further evaluation and treatment. If worsening symptoms she could be a candidate for an MRI.  Spent  25 minutes with patient face-to-face and had greater than 50% of counseling including as described above in assessment and plan.

## 2016-10-23 NOTE — Progress Notes (Deleted)
[name] [address] [place], [place] [address] Phone: [phone] Subjective:    CC: hip pain f/u   BJY:NWGNFAOZHY  Olivia Barnes is a 61 y.o. female coming in with complaint of right-sided hip pain. 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 2016 Was concern for worsening pain more lateral hip and back.   Xray taken at last exam showed DDD at multiple levels with scoliosis.   Patient was to start HEP, ice protocol, avoid certain activity.  Patient states She is approximately 75% better. Patient states that she is making some progress. Patient states that sometimes she has intermittent pain in the left groin area. Still having some mild pain on the lateral aspects of the hip. Overall though does seem like she is doing better. No side effects to the medications. Gabapentin has helped her sleep some.    No past medical history on file. No past surgical history on file. Social History   Social History  . Marital status: Married    Spouse name: N/A  . Number of children: N/A  . Years of education: N/A   Social History Main Topics  . Smoking status: Unknown If Ever Smoked  . Smokeless tobacco: Not on file  . Alcohol use Not on file  . Drug use: Unknown  . Sexual activity: Not on file   Other Topics Concern  . Not on file   Social History Narrative  . No narrative on file   No Known Allergies No family history on file. no family hx.    Past medical history, social, surgical and family history all reviewed in electronic medical record.  No pertanent information unless stated regarding to the chief complaint.   Review of Systems: No headache, visual changes, nausea, vomiting, diarrhea, constipation, dizziness, abdominal pain, skin rash, fevers, chills, night sweats, weight loss, swollen  lymph nodes, chest pain, shortness of breath, mood changes.   Objective  There were no vitals taken for this visit.  General: No apparent distress alert and oriented x3 mood and affect normal, dressed appropriately.  HEENT: Pupils equal, extraocular movements intact  Respiratory: Patient's speak in full sentences and does not appear short of breath  Cardiovascular: No lower extremity edema, non tender, no erythema  Skin: Warm dry intact with no signs of infection or rash on extremities or on axial skeleton.  Abdomen: Soft nontender  Neuro: Cranial nerves II through XII are intact, neurovascularly intact in all extremities with 2+ DTRs and 2+ pulses.  Lymph: No lymphadenopathy of posterior or anterior cervical chain or axillae bilaterally.  Gait mild antalgic gait MSK:  Non tender with full range of motion and good stability and symmetric strength and tone of shoulders, elbows, wrist,  knee and ankles bilaterally.  Back Exam:  Inspection: Unremarkable  Motion: Flexion 30 deg, Extension 25 deg, Side Bending to 25 deg bilaterally,  Rotation to 25 deg bilaterally  SLR laying: Significant tightness of the hamstrings bilaterally with worsening pain on the right side XSLR laying: Negative  Palpable tenderness: Moderate tenderness of the per spinal musculature on the right side of the lumbar spine. FABER: negative. Sensory change: Gross sensation intact to all lumbar and sacral dermatomes.  Reflexes: 2+ at both patellar tendons, 2+ at achilles tendons, Babinski's downgoing.  Strength at foot  Plantar-flexion: 5/5 Dorsi-flexion: 5/5 Eversion:  5/5 Inversion: 5/5  Leg strength  Quad: 5/5 Hamstring: 5/5 Hip flexor: 5/5 Hip abductors: 5/5  Gait unremarkable.  FAO:ZHYQMHip:right ROM IR: 15 Deg, ER: 35 Deg, Flexion: 100 Deg, Extension: 60 Deg, Abduction: 25 Deg, Adduction: 25 Deg Strength 4/5 in all panes Pelvic alignment unremarkable to inspection and palpation. Greater trochanter still minorly tender  to palpation Mild discomfort over the right piriformis Decrease ROM  Pain over the right SI joint Contralateral hip shows decreased range of motion with internal rotation of 15..        Impression and Recommendations:     This case required medical decision making of moderate complexity.      Note: This dictation was prepared with Dragon dictation along with smaller phrase technology. Any transcriptional errors that result from this process are unintentional.

## 2016-10-24 ENCOUNTER — Ambulatory Visit: Payer: Managed Care, Other (non HMO) | Admitting: Family Medicine

## 2017-02-26 ENCOUNTER — Other Ambulatory Visit: Payer: Self-pay | Admitting: Family Medicine

## 2017-02-27 NOTE — Telephone Encounter (Signed)
Refill done.  

## 2018-06-22 ENCOUNTER — Ambulatory Visit (HOSPITAL_COMMUNITY)
Admission: EM | Admit: 2018-06-22 | Discharge: 2018-06-22 | Disposition: A | Discharge status: Home / Self Care | Payer: Worker's Compensation | Attending: Family Medicine | Admitting: Family Medicine

## 2018-06-22 ENCOUNTER — Ambulatory Visit (INDEPENDENT_AMBULATORY_CARE_PROVIDER_SITE_OTHER): Payer: Worker's Compensation

## 2018-06-22 ENCOUNTER — Encounter (HOSPITAL_COMMUNITY): Payer: Self-pay | Admitting: Emergency Medicine

## 2018-06-22 DIAGNOSIS — S7002XA Contusion of left hip, initial encounter: Secondary | ICD-10-CM | POA: Diagnosis not present

## 2018-06-22 DIAGNOSIS — M25552 Pain in left hip: Secondary | ICD-10-CM | POA: Diagnosis not present

## 2018-06-22 DIAGNOSIS — W19XXXA Unspecified fall, initial encounter: Secondary | ICD-10-CM | POA: Diagnosis not present

## 2018-06-22 HISTORY — DX: Essential (primary) hypertension: I10

## 2018-06-22 HISTORY — DX: Hyperlipidemia, unspecified: E78.5

## 2018-06-22 IMAGING — DX DG HIP (WITH OR WITHOUT PELVIS) 2-3V*L*
3 series · 3 of 3 positions shown · non-contrast
Comparison: None.

CLINICAL DATA: Status post fall today landing on left hip with left
hip pain.

EXAM:
DG HIP (WITH OR WITHOUT PELVIS) 2-3V LEFT

[hip ap]
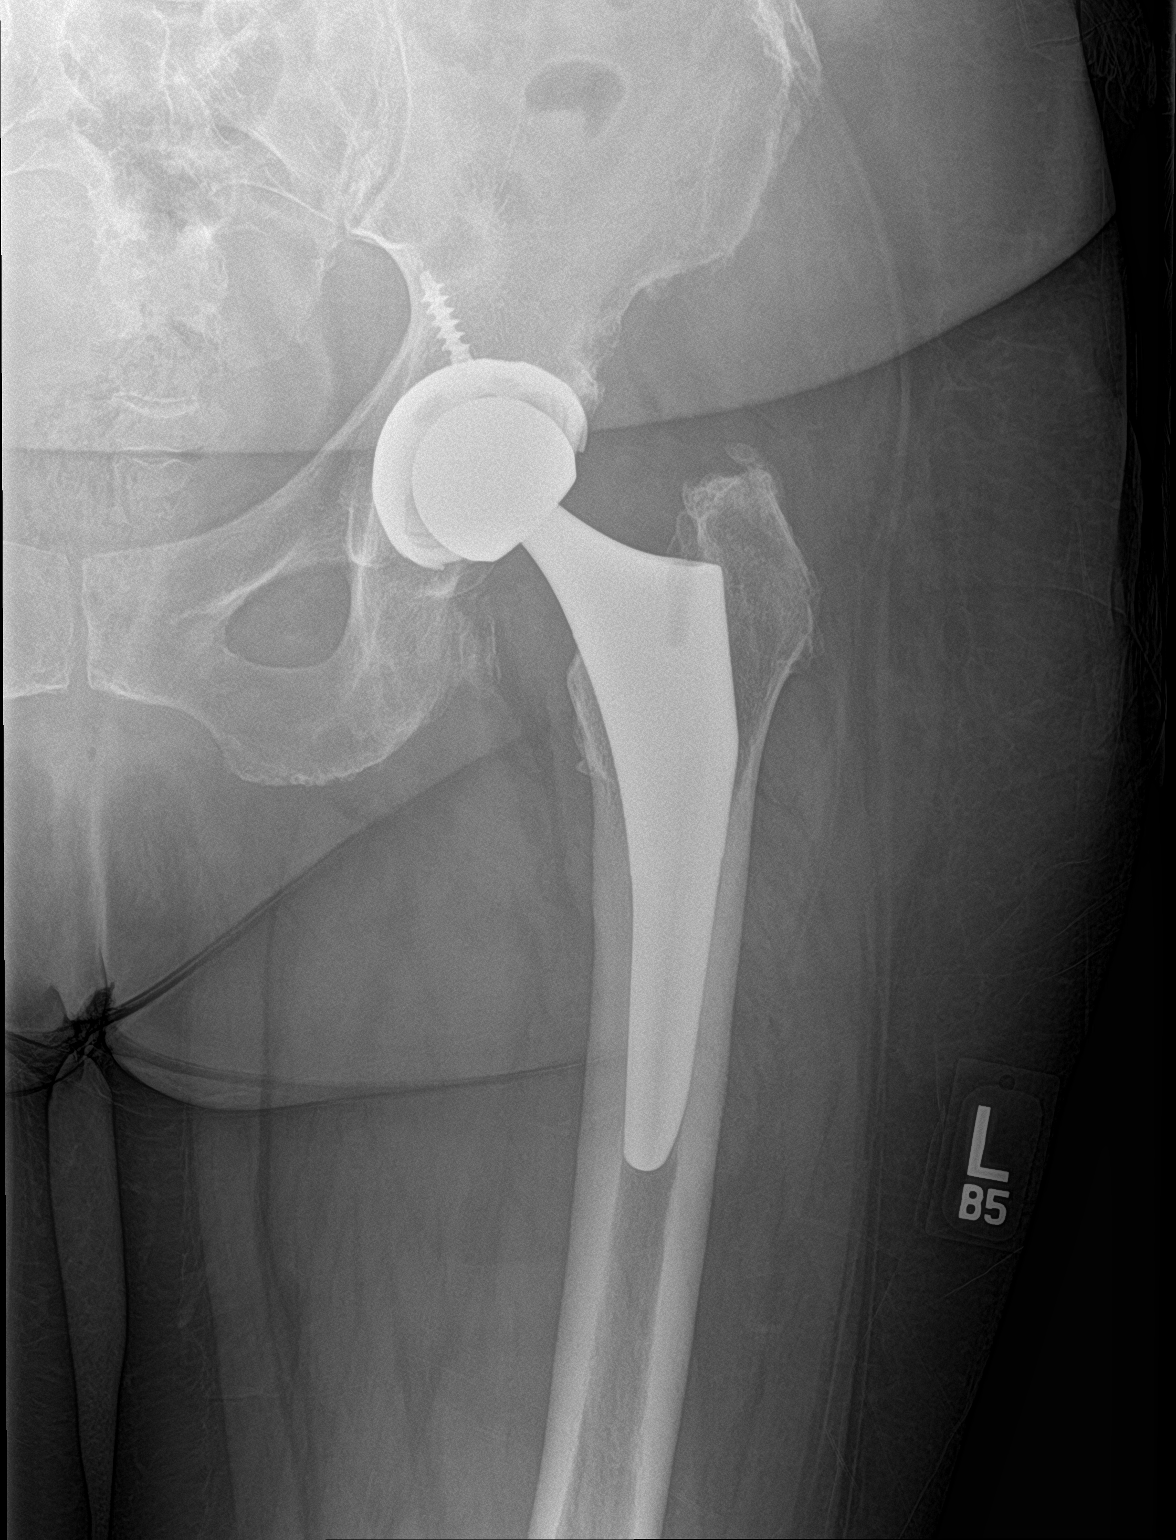

[hip lat]
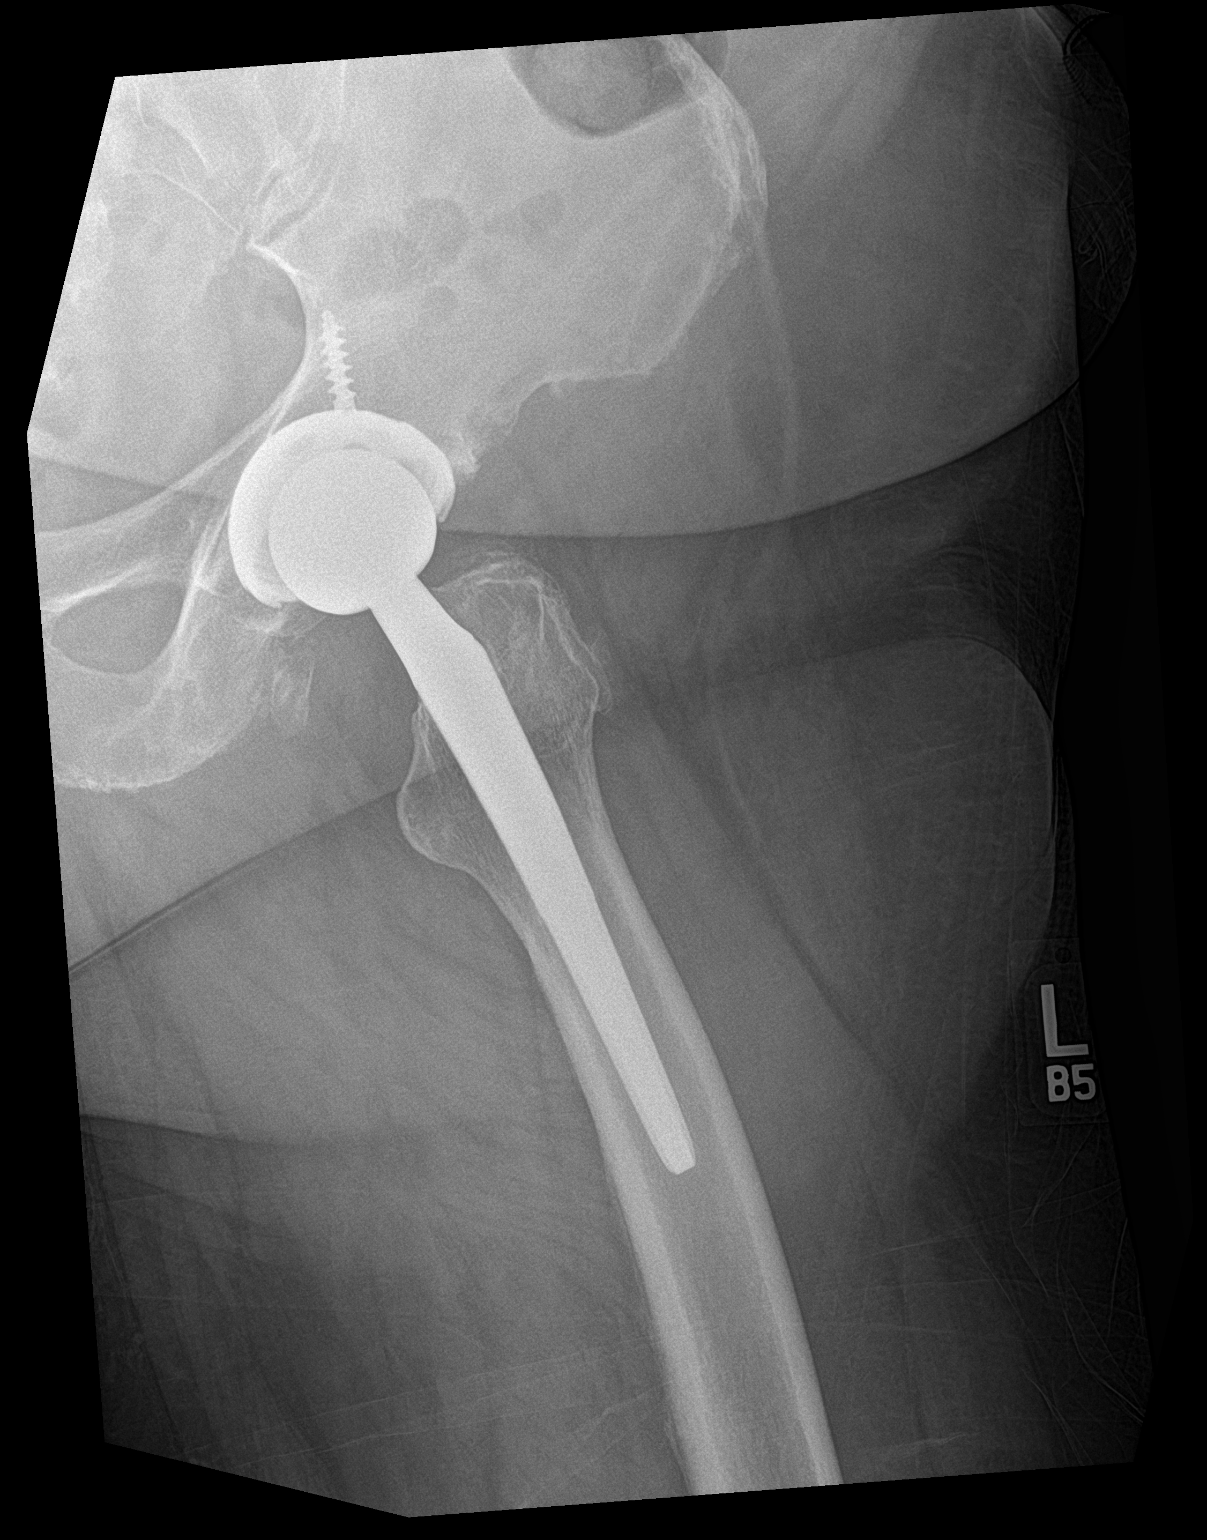

[pelvis ap]
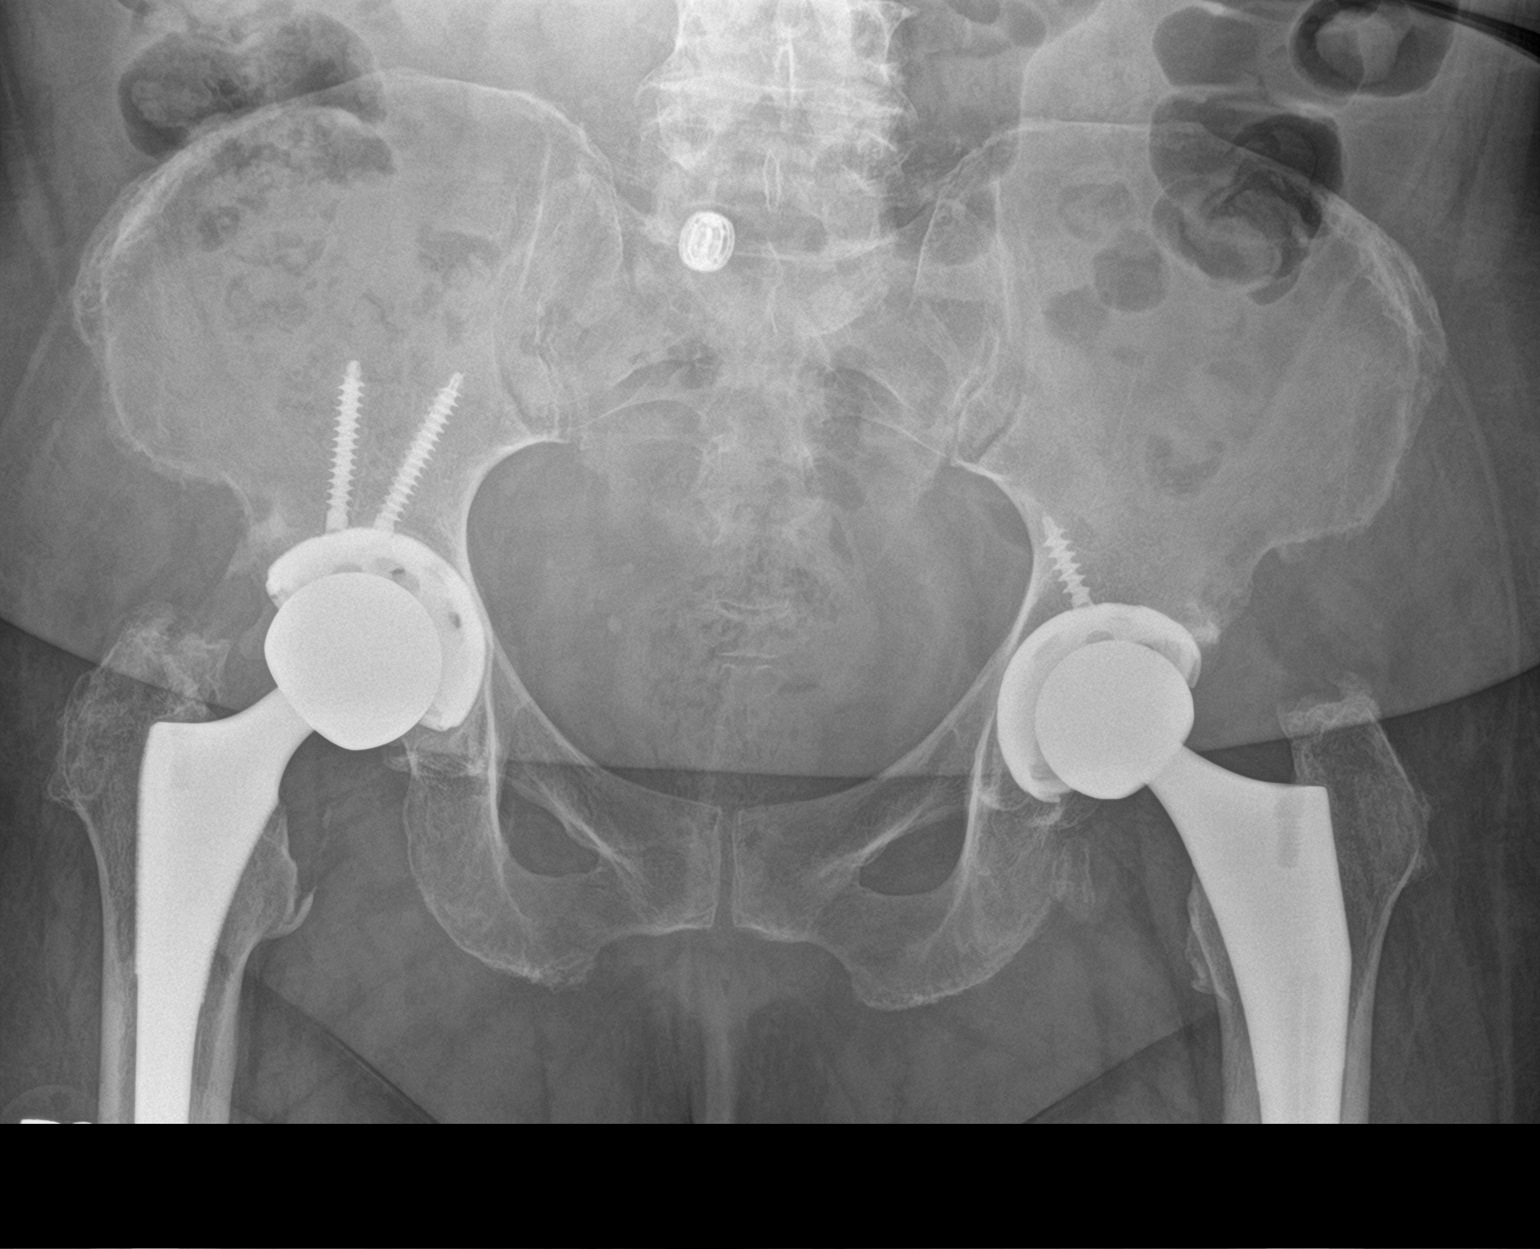

[3 of 3 positions shown; findings below may reference images not displayed]

FINDINGS: There is no evidence of hip fracture or dislocation. Bilateral hip
replacement are noted without malalignment.
IMPRESSION: No acute fracture or dislocation.

## 2018-06-22 MED ORDER — TRAMADOL HCL 50 MG PO TABS: 50.0000 mg | ORAL_TABLET | Freq: Four times a day (QID) | ORAL | 0 refills | Status: DC | PRN

## 2018-06-22 NOTE — ED Triage Notes (Signed)
PT tripped and fell backwards at work today. PT reports left hip pain and back pain. PT is ambulatory.

## 2018-06-22 NOTE — Discharge Instructions (Signed)
May take Advil or Aleve for moderate pain Take tramadol as needed for severe pain Limit walking.  Stay off your feet for a couple days I will give you a note for work until Monday, after this you will need to see your company's workers comp doctor

## 2018-06-22 NOTE — ED Provider Notes (Signed)
[facility]    CSN: [id number] Arrival date & time: 06/22/18  1631     History   Chief Complaint Chief Complaint  Patient presents with  . Hip Pain  . Fall    HPI Olivia Barnes is a 62 y.o. female.   HPI  Patient is here for hip pain.  She has had both of her hips replaced.  Left hip was just replaced in the spring of this year February or March.  She did well during her recovery.  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  She fell over onto her left side landing on her left hip.  It was immediately painful.  She has pain with weightbearing.  Also some pain in her low back.  She tried to finish her shift on modified activity and then came here after work.  She has reported to work that she was injured on the job.  She would like to have her hip checked for possible injury/fracture.  No numbness or weakness in the leg.  She has not noticed any bruising or swelling.   Past Medical History:  Diagnosis Date  . Hyperlipemia   . Hypertension     Patient Active Problem List   Diagnosis Date Noted  . Lumbar radiculopathy 03/20/2016  . Arthritis of right hip 10/20/2014  . Septic arthritis (HCC) 07/13/2014  . Transient synovitis of wrist 07/13/2014  . CN (constipation) 07/13/2014  . Essential hypertension 07/13/2014  . Primary osteoarthritis of right hip 07/13/2014    Past Surgical History:  Procedure Laterality Date  . REPLACEMENT TOTAL HIP W/  RESURFACING IMPLANTS Bilateral     OB History   None      Home Medications    Prior to Admission medications   Medication Sig Start Date End Date Taking? Authorizing Provider  UNKNOWN TO PATIENT    Yes [provider]  hydrochlorothiazide (MICROZIDE) 12.5 MG capsule Take 12.5 mg by mouth daily.    [provider]  metoprolol succinate (TOPROL-XL) 50 MG 24 hr tablet Take 50 mg by mouth  daily. Take with or immediately following a meal.    [provider]  PENNSAID 2 % SOLN Apply 1 pump twice daily. 02/27/17   Judi Saa, DO  polyethylene glycol (MIRALAX / GLYCOLAX) packet Take 17 g by mouth daily.    [provider]  traMADol (ULTRAM) 50 MG tablet Take 1 tablet (50 mg total) by mouth every 6 (six) hours as needed. 06/22/18   Eustace Moore, MD    Family History No family history on file. There is family history of hypertension, heart disease, diabetes.  No cancers. Social History Social History   Tobacco Use  . Smoking status: Unknown If Ever Smoked  Substance Use Topics  . Alcohol use: Not on file  . Drug use: Not on file     Allergies   Gabapentin and Penicillins   Review of Systems Review of Systems  Constitutional: Negative for chills and fever.  HENT: Negative for ear pain and sore throat.   Eyes: Negative for pain and visual disturbance.  Respiratory: Negative for cough and shortness of breath.   Cardiovascular: Negative for chest pain and palpitations.  Gastrointestinal: Negative for abdominal pain and vomiting.  Genitourinary: Negative for dysuria and hematuria.  Musculoskeletal: Positive for arthralgias, back pain and gait problem.  Skin:  Negative for color change and rash.  Neurological: Negative for seizures and syncope.  All other systems reviewed and are negative.    Physical Exam Triage Vital Signs ED Triage Vitals  Enc Vitals Group     BP 06/22/18 1724 (!) 126/52     Pulse Rate 06/22/18 1724 69     Resp 06/22/18 1724 16     Temp 06/22/18 1724 97.7 F (36.5 C)     Temp Source 06/22/18 1724 Oral     SpO2 06/22/18 1724 100 %     Weight --      Height --      Head Circumference --      Peak Flow --      Pain Score 06/22/18 1723 5     Pain Loc --      Pain Edu? --      Excl. in GC? --    No data found.  Updated Vital Signs BP (!) 126/52   Pulse 69   Temp 97.7 F (36.5 C) (Oral)   Resp 16   SpO2  100%       Physical Exam  Constitutional: She appears well-developed and well-nourished. She appears distressed.  Moderately uncomfortable  HENT:  Head: Normocephalic and atraumatic.  Mouth/Throat: Oropharynx is clear and moist.  Eyes: Pupils are equal, round, and reactive to light. Conjunctivae are normal.  Neck: Normal range of motion.  Cardiovascular: Normal rate.  Pulmonary/Chest: Effort normal. No respiratory distress.  Abdominal: Soft. She exhibits no distension.  Musculoskeletal: Normal range of motion. She exhibits no edema.  Lumbar spine is straight and nontender.  No spasm or tenderness of the lumbar muscles.  Slow but full lumbar motion.  Left hip is tenderness over the lateral greater trochanter.  Mild tenderness over the left SI joint.  Hip has limited range of motion in all directions secondary to pain.  No bruising is seen.  Knee and ankle exam is normal bilaterally.  Right hip is a well-healed scar, full expected motion  Neurological: She is alert.  Skin: Skin is warm and dry.  Psychiatric: She has a normal mood and affect. Her behavior is normal.     UC Treatments / Results  Labs (all labs ordered are listed, but only abnormal results are displayed) Labs Reviewed - No data to display  EKG None  Radiology Dg Hip Unilat W Or Wo Pelvis 2-3 Views Left  Result Date: 06/22/2018 CLINICAL DATA:  Status post fall today landing on left hip with left hip pain. EXAM: DG HIP (WITH OR WITHOUT PELVIS) 2-3V LEFT COMPARISON:  None. FINDINGS: There is no evidence of hip fracture or dislocation. Bilateral hip replacement are noted without malalignment. IMPRESSION: No acute fracture or dislocation. Electronically Signed   By: Sherian Rein M.D.   On: 06/22/2018 18:40    Procedures Procedures (including critical care time)  Medications Ordered in UC Medications - No data to display  Initial Impression / Assessment and Plan / UC Course  I have reviewed the triage vital signs  and the nursing notes.  Pertinent labs & imaging results that were available during my care of the patient were reviewed by me and considered in my medical decision making (see chart for details).    Do not see any fracture, dislocation, damage to the replaced hip.  This is reassuring.  I did let her know that she has bruising and strain to the area that will cause her pain for at least a few days.  I recommended that she have light duty work for the rest of the week.  She tells me that there is no light duty work available.  I did put on her medical note for work that she could do light duty if it is available.  Activity as tolerated.  Pain medicine as needed.  She was given tramadol for pain. Final Clinical Impressions(s) / UC Diagnoses   Final diagnoses:  Pain of left hip joint  Fall, initial encounter  Contusion of left hip, initial encounter     Discharge Instructions     May take Advil or Aleve for moderate pain Take tramadol as needed for severe pain Limit walking.  Stay off your feet for a couple days I will give you a note for work until Monday, after this you will need to see your company's workers comp doctor   ED Prescriptions    Medication Sig Dispense Auth. Provider   traMADol (ULTRAM) 50 MG tablet Take 1 tablet (50 mg total) by mouth every 6 (six) hours as needed. 15 tablet Eustace Moore, MD     Controlled Substance Prescriptions [place] Controlled Substance Registry consulted? Not Applicable   Eustace Moore, MD 06/22/18 2136

## 2019-01-23 NOTE — Progress Notes (Signed)
Tawana ScaleZach Zander Ingham D.O. [facility] [address] [place], [place] [address] Phone: [phone]) [phone] Subjective:   [name], Valerie Wolf, am serving as a scribe for Dr. Antoine PrimasZachary Letetia Romanello.  I'm seeing this patient by the request  of:  Forrest Moronuehle, Stephen, MD   CC: Hip pain  OZH:[id number]:Subjective  Olivia Barnes Olivia Barnes is a 63 y.o. female coming in with complaint of hip pain. Last seen in 2017 for lumbar radiculopathy. States that her pain is similar to when she was last seen. Pain with deep knee bending, lying on her side and if she is on her feet for too long. Pain increased within past week. Is intermittent though. Has been using Tylenol for pain. Denies any radiating symptoms.      Patient's past medical history significant for bilateral hip replacement.  Last x-rays were probably 2019.  Independently visualized by me.  Past Medical History:  Diagnosis Date  . Hyperlipemia   . Hypertension    Past Surgical History:  Procedure Laterality Date  . 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 Marital status: Married    Spouse name: Not on file  . Number of children: Not on file  . Years of education: Not on file  . Highest education level: Not on file  Occupational History  . Not on file  Social Needs  . Financial resource strain: Not on file  . Food insecurity:    Worry: Not on file    Inability: Not on file  . Transportation needs:    Medical: Not on file    Non-medical: Not on file  Tobacco Use  . Smoking status: Unknown If Ever Smoked  Substance and Sexual Activity  . Alcohol use: Not on file  . Drug use: Not on file  . Sexual activity: Not on file  Lifestyle  . Physical activity:    Days per week: Not on file    Minutes per session: Not on file  . Stress: Not on file  Relationships  . Social connections:    Talks on phone: Not on file    Gets together: Not on file    Attends religious service: Not on file    Active member of  club or organization: Not on file    Attends meetings of clubs or organizations: Not on file    Relationship status: Not on file  Other Topics Concern  . Not on file  Social History Narrative  . Not on file   Allergies  Allergen Reactions  . Gabapentin Nausea And Vomiting  . Penicillins Rash   No family history on file.   Current Outpatient Medications (Cardiovascular):  .  hydrochlorothiazide (MICROZIDE) 12.5 MG capsule, Take 12.5 mg by mouth daily.     Current Outpatient Medications (Other):  [name].  UNKNOWN TO PATIENT,  .  Diclofenac Sodium 2 % SOLN, Place 2 g onto the skin 2 (two) times daily. .  nortriptyline (PAMELOR) 10 MG capsule, Take 1 capsule (10 mg total) by mouth at bedtime.    Past medical history, social, surgical and family history all reviewed in electronic medical record.  No pertanent information unless stated regarding to the chief complaint.   Review of Systems:  No headache, visual changes, nausea, vomiting, diarrhea, constipation, dizziness, abdominal pain, skin rash, fevers, chills, night sweats, weight loss, swollen lymph nodes, body aches, joint swelling,  chest pain, shortness of breath, mood changes.  Positive muscle aches  Objective  Blood pressure 128/88, pulse 74, height 5\' 3"  (1.6 m), weight 232 lb (105.2 kg), SpO2 97 %.    General: No apparent distress alert and oriented x3 mood and affect normal, dressed appropriately.  HEENT: Pupils equal, extraocular movements intact  Respiratory: Patient's speak in full sentences and does not appear short of breath  Cardiovascular: No lower extremity edema, non tender, no erythema  Skin: Warm dry intact with no signs of infection or rash on extremities or on axial skeleton.  Abdomen: Soft nontender  Neuro: Cranial nerves II through XII are intact, neurovascularly intact in all extremities with 2+ DTRs and 2+ pulses.  Lymph: No lymphadenopathy of posterior or anterior cervical chain or axillae bilaterally.   Gait antalgic MSK:  Non tender with full range of motion and good stability and symmetric strength and tone of shoulders, elbows, wrist,  knee and ankles bilaterally.     Bilateral hip exam shows the patient does have some limited internal range of motion and nontender secondary to replacement.  Patient's previous incision areas does not have any signs of any infectious etiology.  Tender to palpation over the lateral aspect of the hips bilaterally.  Negative straight leg test.  [id number]; 15 additional minutes spent for Therapeutic exercises as stated in above notes.  This included exercises focusing on stretching, strengthening, with significant focus on eccentric aspects.   Long term goals include an improvement in range of motion, strength, endurance as well as avoiding reinjury. Patient's frequency would include in 1-2 times a day, 3-5 times a week for a duration of 6-12 weeks. Hip strengthening exercises which included:  Pelvic tilt/bracing to help with proper recruitment of the lower abs and pelvic floor muscles  Glute strengthening to properly contract glutes without over-engaging low back and hamstrings - prone hip extension and glute bridge exercises Proper stretching techniques to increase effectiveness for the hip flexors, groin, quads, piriformic and low back when appropriate    Proper technique shown and discussed handout in great detail with ATC.  All questions were discussed and answered.     Impression and Recommendations:     This case required medical decision making of moderate complexity. The above documentation has been reviewed and is accurate and complete Judi Saa, DO       Note: This dictation was prepared with Dragon dictation along with smaller phrase technology. Any transcriptional errors that result from this process are unintentional.

## 2019-01-24 ENCOUNTER — Encounter: Payer: Self-pay | Admitting: Family Medicine

## 2019-01-24 ENCOUNTER — Other Ambulatory Visit: Payer: Self-pay

## 2019-01-24 ENCOUNTER — Ambulatory Visit (INDEPENDENT_AMBULATORY_CARE_PROVIDER_SITE_OTHER): Payer: 59 | Admitting: Family Medicine

## 2019-01-24 VITALS — BP 128/88 | HR 74 | Ht 63.0 in | Wt 232.0 lb

## 2019-01-24 DIAGNOSIS — Z96643 Presence of artificial hip joint, bilateral: Secondary | ICD-10-CM

## 2019-01-24 DIAGNOSIS — M25559 Pain in unspecified hip: Secondary | ICD-10-CM | POA: Diagnosis not present

## 2019-01-24 DIAGNOSIS — G8928 Other chronic postprocedural pain: Secondary | ICD-10-CM | POA: Diagnosis not present

## 2019-01-24 DIAGNOSIS — M25552 Pain in left hip: Secondary | ICD-10-CM | POA: Insufficient documentation

## 2019-01-24 MED ORDER — NORTRIPTYLINE HCL 10 MG PO CAPS
10.0000 mg | ORAL_CAPSULE | Freq: Every day | ORAL | 1 refills | Status: DC
Start: 2019-01-24 — End: 2019-02-15

## 2019-01-24 MED ORDER — DICLOFENAC SODIUM 2 % TD SOLN: 2.0000 g | Freq: Two times a day (BID) | TRANSDERMAL | 3 refills | Status: DC

## 2019-01-24 NOTE — Patient Instructions (Signed)
[employer] orthotics "total support" online would be great  Good to see you Pennsaid 2x daily, finger tip sized amount See you in 3-6 weeks

## 2019-01-24 NOTE — Assessment & Plan Note (Signed)
Bilateral hip pain.  Patient has had total replacement bilaterally.  We discussed with patient in great length.  I do believe the patient could have more of a tendinitis and a bursitis.  We discussed icing regimen and home exercises.  Home exercises given today.  Nortriptyline given today for nighttime relief avoid gabapentin secondary to nausea and vomiting.  Started topical Pennsaid.  Discussed hip abductor strengthening as well as with patient having pes planus encouraged over-the-counter orthotics.  Follow-up again in 4 weeks

## 2019-02-12 NOTE — Progress Notes (Signed)
Olivia Barnes Gravely D.O. [facility] [address] [place], [place] [address] Phone: [phone] Subjective:    I'm seeing this patient by the request  of:    CC: Lateral hip pain after hip replacement follow-up  BJY:[id number]:Subjective   01/24/2019 Bilateral hip pain.  Patient has had total replacement bilaterally.  We discussed with patient in great length.  I do believe the patient could have more of a tendinitis and a bursitis.  We discussed icing regimen and home exercises.  Home exercises given today.  Nortriptyline given today for nighttime relief avoid gabapentin secondary to nausea and vomiting.  Started topical Pennsaid.  Discussed hip abductor strengthening as well as with patient having pes planus encouraged over-the-counter orthotics.  Follow-up again in 4 weeks  02/14/2019 Olivia Barnes is a 63 y.o. female coming in with complaint of bilateral hip pain. States she is feeling pretty good today.  Patient has had no replacements and overall is doing relatively well.  Still having some mild discomfort from time to time.  1 time a sharp pain in the groin area but since then doing very well.  Has been wearing the over-the-counter orthotics and taking the natural supplementations.  Patient denies any weakness of the lower extremity.  Denies any significant back pain.  Is trying to lose weight.      Past Medical History:  Diagnosis Date  . Hyperlipemia   . Hypertension    Past Surgical History:  Procedure Laterality Date  . 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 Marital status: Married    Spouse name: Not on file  . Number of children: Not on file  . Years of education: Not on file  . Highest education level: Not on file  Occupational History  . Not on file  Social Needs  . Financial resource strain: Not on file  . Food insecurity    Worry: Not on file    Inability: Not on file  . Transportation needs   Medical: Not on file    Non-medical: Not on file  Tobacco Use  . Smoking status: Unknown If Ever Smoked  . Smokeless tobacco: Never Used  Substance and Sexual Activity  . Alcohol use: Not on file  . Drug use: Not on file  . Sexual activity: Not on file  Lifestyle  . Physical activity    Days per week: Not on file    Minutes per session: Not on file  . Stress: Not on file  Relationships  . Social Musicianconnections    Talks on phone: Not on file    Gets together: Not on file    Attends religious service: Not on file    Active member of club or organization: Not on file    Attends meetings of clubs or organizations: Not on file    Relationship status: Not on file  Other Topics Concern  . Not on file  Social History Narrative  . Not on file   Allergies  Allergen Reactions  . Gabapentin Nausea And Vomiting  . Penicillins Rash   No family history on file.   Current Outpatient Medications (Cardiovascular):  .  hydrochlorothiazide (MICROZIDE) 12.5 MG capsule, Take 12.5 mg by mouth daily.     Current Outpatient Medications (Other):  [name].  Diclofenac Sodium 2 % SOLN, Place 2 g onto the skin 2 (two) times daily. .  nortriptyline (PAMELOR) 10 MG capsule, Take 1 capsule (10 mg  total) by mouth at bedtime. [name]  UNKNOWN TO PATIENT,  .  Vitamin D, Ergocalciferol, (DRISDOL) 1.25 MG (50000 UT) CAPS capsule, Take 1 capsule (50,000 Units total) by mouth every 7 (seven) days.    Past medical history, social, surgical and family history all reviewed in electronic medical record.  No pertanent information unless stated regarding to the chief complaint.   Review of Systems:  No headache, visual changes, nausea, vomiting, diarrhea, constipation, dizziness, abdominal pain, skin rash, fevers, chills, night sweats, weight loss, swollen lymph nodes,  chest pain, shortness of breath, mood changes.  Positive muscle aches, body aches  Objective  Blood pressure 118/70, pulse 79, height 5\' 3"  (1.6 m),  weight 233 lb (105.7 kg), SpO2 97 %.    General: No apparent distress alert and oriented x3 mood and affect normal, dressed appropriately.  HEENT: Pupils equal, extraocular movements intact  Respiratory: Patient's speak in full sentences and does not appear short of breath  Cardiovascular: No lower extremity edema, non tender, no erythema  Skin: Warm dry intact with no signs of infection or rash on extremities or on axial skeleton.  Abdomen: Soft nontender  Neuro: Cranial nerves II through XII are intact, neurovascularly intact in all extremities with 2+ DTRs and 2+ pulses.  Lymph: No lymphadenopathy of posterior or anterior cervical chain or axillae bilaterally.  Gait antalgic but improved. MSK:  Non tender with full range of motion and good stability and symmetric strength and tone of shoulders, elbows, wrist, knee and ankles bilaterally.  Hip: Bilateral ROM IR: 45 Deg, ER: 25 Deg, Flexion: 100 Deg, Extension: 80 Deg, Abduction: 45 Deg, Adduction: 15 Deg Strength IR: 5/5, ER: 5/5, Flexion: 5/5, Extension: 5/5, Abduction: 5/5, Adduction: 5/5 Pelvic alignment unremarkable to inspection and palpation. Standing hip rotation and gait without trendelenburg sign / unsteadiness. Mild pain over the greater trochanteric area.  Minimal pain with internal range of motion. No SI joint tenderness and normal minimal SI movement.    Impression and Recommendations:    . The above documentation has been reviewed and is accurate and complete Lyndal Pulley, DO       Note: This dictation was prepared with Dragon dictation along with smaller phrase technology. Any transcriptional errors that result from this process are unintentional.

## 2019-02-14 ENCOUNTER — Ambulatory Visit: Payer: 59 | Admitting: Family Medicine

## 2019-02-14 ENCOUNTER — Encounter: Payer: Self-pay | Admitting: Family Medicine

## 2019-02-14 ENCOUNTER — Other Ambulatory Visit: Payer: Self-pay

## 2019-02-14 DIAGNOSIS — M25559 Pain in unspecified hip: Secondary | ICD-10-CM

## 2019-02-14 DIAGNOSIS — M25551 Pain in right hip: Secondary | ICD-10-CM

## 2019-02-14 DIAGNOSIS — G8928 Other chronic postprocedural pain: Secondary | ICD-10-CM

## 2019-02-14 DIAGNOSIS — Z96643 Presence of artificial hip joint, bilateral: Secondary | ICD-10-CM | POA: Diagnosis not present

## 2019-02-14 MED ORDER — VITAMIN D (ERGOCALCIFEROL) 1.25 MG (50000 UNIT) PO CAPS: 50000.0000 [IU] | ORAL_CAPSULE | ORAL | 0 refills | Status: DC

## 2019-02-14 NOTE — Assessment & Plan Note (Addendum)
Patient is doing better at this time.  We discussed continuing the same regimen.:  Once weekly vitamin D that I think will be more beneficial for her.  Will help with muscle strength and endurance.  Patient continues to increase activity.  Follow-up again in 6-8

## 2019-02-14 NOTE — Patient Instructions (Addendum)
Good to see you.  Once weekly vitamin D for 12 weeks.  See me again in 6-8 weeks if still in pain.

## 2019-02-15 ENCOUNTER — Other Ambulatory Visit: Payer: Self-pay

## 2019-02-15 ENCOUNTER — Other Ambulatory Visit: Payer: Self-pay | Admitting: Family Medicine

## 2019-02-15 MED ORDER — NORTRIPTYLINE HCL 10 MG PO CAPS: 10.0000 mg | ORAL_CAPSULE | Freq: Every day | ORAL | 0 refills | Status: DC

## 2019-02-25 ENCOUNTER — Telehealth: Payer: Self-pay | Admitting: Family Medicine

## 2019-02-25 NOTE — Telephone Encounter (Signed)
Patient is calling to see if she can get an appt with Dr. Tamala Julian for Monday. Her Left Knee is paining her. Please advise [phone]

## 2019-03-01 ENCOUNTER — Ambulatory Visit: Payer: Self-pay

## 2019-03-01 ENCOUNTER — Ambulatory Visit: Payer: 59 | Admitting: Family Medicine

## 2019-03-01 ENCOUNTER — Encounter: Payer: Self-pay | Admitting: Family Medicine

## 2019-03-01 ENCOUNTER — Other Ambulatory Visit: Payer: Self-pay

## 2019-03-01 VITALS — BP 136/88 | HR 75 | Ht 63.0 in

## 2019-03-01 DIAGNOSIS — G8929 Other chronic pain: Secondary | ICD-10-CM

## 2019-03-01 DIAGNOSIS — S76312A Strain of muscle, fascia and tendon of the posterior muscle group at thigh level, left thigh, initial encounter: Secondary | ICD-10-CM | POA: Diagnosis not present

## 2019-03-01 DIAGNOSIS — M25562 Pain in left knee: Secondary | ICD-10-CM

## 2019-03-01 MED ORDER — TIZANIDINE HCL 4 MG PO TABS: 4.0000 mg | ORAL_TABLET | Freq: Every day | ORAL | 0 refills | Status: DC

## 2019-03-01 NOTE — Telephone Encounter (Signed)
Pt scheduled for today.  

## 2019-03-01 NOTE — Assessment & Plan Note (Signed)
,   And hold on the exercises/CESI.  Patient does have a hamstring tear of the tendon noted off of the bone with significant hypoechoic changes on the ultrasound.  At this point I would like patient not to start any exercises.  Thigh compression sleeve, patient is able to bear weight, topical anti-inflammatories, muscle around her at night.  Follow-up again in 1 to 3 weeks

## 2019-03-01 NOTE — Patient Instructions (Signed)
Thigh compression sleeve Pennsaid twice daily See me in 1-2 weeks

## 2019-03-01 NOTE — Progress Notes (Signed)
[facility] [place], [place] [address] Phone: [phone] Subjective:    I'm seeing this patient by the request  of:    CC: Left leg pain  CZY:[id number]   02/14/2019: Patient is doing better at this time.  We discussed continuing the same regimen.:  Once weekly vitamin D that I think will be more beneficial for her.  Will help with muscle strength and endurance.  Patient continues to increase activity.  Follow-up again in 6-8  Update 03/01/2019: Olivia Barnes is a 63 y.o. female coming in with complaint of left knee pain, medial aspect. Stepped up on her porch and felt a pop one week ago. Pain with knee flexion. Has not had any instability. Using ice and Aleve. Denies any radiating symptoms.  Patient states it only seems to hurt mostly when she moves her knee.  States it seems to hurt more with extension or flexion.  When she goes up and down the stairs are severely tender as well.  Rates the severity of pain is 7 out of 10     Past Medical History:  Diagnosis Date  . Hyperlipemia   . Hypertension    Past Surgical History:  Procedure Laterality Date  . 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 Marital status: Married    Spouse name: Not on file  . Number of children: Not on file  . Years of education: Not on file  . Highest education level: Not on file  Occupational History  . Not on file  Social Needs  . Financial resource strain: Not on file  . Food insecurity    Worry: Not on file    Inability: Not on file  . Transportation needs    Medical: Not on file    Non-medical: Not on file  Tobacco Use  . Smoking status: Unknown If Ever Smoked  . Smokeless tobacco: Never Used  Substance and Sexual Activity  . Alcohol use: Not on file  . Drug use: Not on file  . Sexual activity: Not on file  Lifestyle  . Physical activity    Days per week: Not on file    Minutes per  session: Not on file  . Stress: Not on file  Relationships  . Social Herbalist on phone: Not on file    Gets together: Not on file    Attends religious service: Not on file    Active member of club or organization: Not on file    Attends meetings of clubs or organizations: Not on file    Relationship status: Not on file  Other Topics Concern  . Not on file  Social History Narrative  . Not on file   Allergies  Allergen Reactions  . Gabapentin Nausea And Vomiting  . Penicillins Rash   No family history on file.   Current Outpatient Medications (Cardiovascular):  .  hydrochlorothiazide (MICROZIDE) 12.5 MG capsule, Take 12.5 mg by mouth daily.     Current Outpatient Medications (Other):  [name]  Diclofenac Sodium 2 % SOLN, Place 2 g onto the skin 2 (two) times daily. .  nortriptyline (PAMELOR) 10 MG capsule, Take 1 capsule (10 mg total) by mouth at bedtime. [name]  UNKNOWN TO PATIENT,  .  Vitamin D, Ergocalciferol, (DRISDOL) 1.25 MG (50000 UT) CAPS capsule, Take 1 capsule (50,000 Units total) by mouth every 7 (seven) days. [name]  tiZANidine (  ZANAFLEX) 4 MG tablet, Take 1 tablet (4 mg total) by mouth at bedtime.    Past medical history, social, surgical and family history all reviewed in electronic medical record.  No pertanent information unless stated regarding to the chief complaint.   Review of Systems:  No headache, visual changes, nausea, vomiting, diarrhea, constipation, dizziness, abdominal pain, skin rash, fevers, chills, night sweats, weight loss, swollen lymph nodes, body aches, joint swelling, muscle aches, chest pain, shortness of breath, mood changes.   Objective  Blood pressure 136/88, pulse 75, height 5\' 3"  (1.6 m), SpO2 98 %. Systems examined below as of    General: No apparent distress alert and oriented x3 mood and affect normal, dressed appropriately.  HEENT: Pupils equal, extraocular movements intact  Respiratory: Patient's speak in full sentences and  does not appear short of breath  Cardiovascular: No lower extremity edema, non tender, no erythema  Skin: Warm dry intact with no signs of infection or rash on extremities or on axial skeleton.  Abdomen: Soft nontender  Neuro: Cranial nerves II through XII are intact, neurovascularly intact in all extremities with 2+ DTRs and 2+ pulses.  Lymph: No lymphadenopathy of posterior or anterior cervical chain or axillae bilaterally.  Gait severely antalgic MSK:  tender with mild limited range of motion and good stability and symmetric strength and tone of shoulders, elbows, wrist,  and ankles bilaterally.  Patient does have bilateral hip replacements, Patient's left knee does have some mild swelling on the medial aspect.  Severe tenderness with certain range of motion.  No significant instability.  Pain over the medial joint line.  Severe pain with resisted flexion of the knee more over the hamstring.  Limited musculoskeletal ultrasound was performed and interpreted by Judi SaaZachary M Smith  Limited ultrasound of patient's knee shows the patient does have some mild to moderate narrowing of the patellofemoral and medial joint space.  Patient has significant swelling though with superficial to the knee joint itself and appears to have more of a hamstring tendon tear.  Possible full-thickness.  Large fluid accumulation in the area. Patient: Hamstring tendon tear   Impression and Recommendations:     This case required medical decision making of moderate complexity. The above documentation has been reviewed and is accurate and complete Judi SaaZachary M Smith, DO       Note: This dictation was prepared with Dragon dictation along with smaller phrase technology. Any transcriptional errors that result from this process are unintentional.

## 2019-03-10 NOTE — Progress Notes (Signed)
[facility] [place], [place] [address] Phone: [phone] Subjective:   [name], am serving as a scribe for Dr. Hulan Saas.  I'm seeing this patient by the request  of:    CC: Left leg follow-up  CWC:[id number]   03/01/2019: And hold on the exercises/CESI.  Patient does have a hamstring tear of the tendon noted off of the bone with significant hypoechoic changes on the ultrasound.  At this point I would like patient not to start any exercises.  Thigh compression sleeve, patient is able to bear weight, topical anti-inflammatories, muscle around her at night.  Follow-up again in 1 to 3 weeks  Update 03/11/2019: Olivia Barnes is a 63 y.o. female coming in with complaint of left leg pain. Pain has improved. Pain with standing and walking a lot. Pain with extreme ROM. Is wearing compression sleeve daily. Has been using Tylenol and the mm relaxer prn.  Patient would state that she is feeling 85% better but continues to have pain with even walking.    Past Medical History:  Diagnosis Date  . Hyperlipemia   . Hypertension    Past Surgical History:  Procedure Laterality Date  . 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 Marital status: Married    Spouse name: Not on file  . Number of children: Not on file  . Years of education: Not on file  . Highest education level: Not on file  Occupational History  . Not on file  Social Needs  . Financial resource strain: Not on file  . Food insecurity    Worry: Not on file    Inability: Not on file  . Transportation needs    Medical: Not on file    Non-medical: Not on file  Tobacco Use  . Smoking status: Unknown If Ever Smoked  . Smokeless tobacco: Never Used  Substance and Sexual Activity  . Alcohol use: Not on file  . Drug use: Not on file  . Sexual activity: Not on file  Lifestyle  . Physical activity    Days per week:  Not on file    Minutes per session: Not on file  . Stress: Not on file  Relationships  . Social Herbalist on phone: Not on file    Gets together: Not on file    Attends religious service: Not on file    Active member of club or organization: Not on file    Attends meetings of clubs or organizations: Not on file    Relationship status: Not on file  Other Topics Concern  . Not on file  Social History Narrative  . Not on file   Allergies  Allergen Reactions  . Gabapentin Nausea And Vomiting  . Penicillins Rash   No family history on file.   Current Outpatient Medications (Cardiovascular):  .  hydrochlorothiazide (MICROZIDE) 12.5 MG capsule, Take 12.5 mg by mouth daily.     Current Outpatient Medications (Other):  [name]  Diclofenac Sodium 2 % SOLN, Place 2 g onto the skin 2 (two) times daily. .  nortriptyline (PAMELOR) 10 MG capsule, Take 1 capsule (10 mg total) by mouth at bedtime. [name]  tiZANidine (ZANAFLEX) 4 MG tablet, Take 1 tablet (4 mg total) by mouth at bedtime. [name]  UNKNOWN TO PATIENT,  .  Vitamin D, Ergocalciferol, (DRISDOL) 1.25 MG (50000 UT) CAPS capsule, Take 1 capsule (50,000  Units total) by mouth every 7 (seven) days.    Past medical history, social, surgical and family history all reviewed in electronic medical record.  No pertanent information unless stated regarding to the chief complaint.   Review of Systems:  No headache, visual changes, nausea, vomiting, diarrhea, constipation, dizziness, abdominal pain, skin rash, fevers, chills, night sweats, weight loss, swollen lymph nodes, body aches, joint swelling, muscle aches, chest pain, shortness of breath, mood changes.   Objective  Blood pressure 108/72, pulse 75, height 5\' 3"  (1.6 m), weight 231 lb (104.8 kg), SpO2 97 %.    General: No apparent distress alert and oriented x3 mood and affect normal, dressed appropriately.  HEENT: Pupils equal, extraocular movements intact  Respiratory: Patient's speak  in full sentences and does not appear short of breath  Cardiovascular: No lower extremity edema, non tender, no erythema  Skin: Warm dry intact with no signs of infection or rash on extremities or on axial skeleton.  Abdomen: Soft nontender  Neuro: Cranial nerves II through XII are intact, neurovascularly intact in all extremities with 2+ DTRs and 2+ pulses.  Lymph: No lymphadenopathy of posterior or anterior cervical chain or axillae bilaterally.  Gait antalgic MSK:  tender with mild limited range of motion and good stability and symmetric strength and tone of shoulders, elbows, wrist, hip and ankles bilaterally.  Left hamstring shows some tenderness to palpation still distally.  Patient does have some mild weakness of the hamstring with resisted flexion.  Patient does have a fullness on the lateral aspect of the knee.  Limited musculoskeletal ultrasound was performed and interpreted by Judi SaaZachary M   Limited ultrasound shows the patient does have hypoechoic changes and what appears to be a cyst formation on the medial aspect of the knee.  Does not seem to communicate with the knee joint itself.  Seems to be more of a hematoma from distal aspect of the hand   Procedure: Real-time Ultrasound Guided Injection of left cyst aspiration Device: GE Logiq Q7 Ultrasound guided injection is preferred based studies that show increased duration, increased effect, greater accuracy, decreased procedural pain, increased response rate, and decreased cost with ultrasound guided versus blind injection.  Verbal informed consent obtained.  Time-out conducted.  Noted no overlying erythema, induration, or other signs of local infection.  Skin prepped in a sterile fashion.  Local anesthesia: Topical Ethyl chloride.  With sterile technique and under real time ultrasound guidance: With an 18-gauge 1-1/2 inch needle patient was injected with 3 cc of 0.5% Marcaine cyst.  Aspiration done.  Seem to be more of a ganglion  in nature.  1 cc of Kenalog 40 mg/mL used Completed without difficulty  Pain immediately resolved suggesting accurate placement of the medication.  Advised to call if fevers/chills, erythema, induration, drainage, or persistent bleeding.  Images permanently stored and available for review in the ultrasound unit.  Impression: Technically successful ultrasound guided injection.   Impression and Recommendations:     This case required medical decision making of moderate complexity. The above documentation has been reviewed and is accurate and complete Judi SaaZachary M , DO       Note: This dictation was prepared with Dragon dictation along with smaller phrase technology. Any transcriptional errors that result from this process are unintentional.

## 2019-03-11 ENCOUNTER — Other Ambulatory Visit: Payer: Self-pay

## 2019-03-11 ENCOUNTER — Ambulatory Visit: Payer: Self-pay

## 2019-03-11 ENCOUNTER — Encounter: Payer: Self-pay | Admitting: Family Medicine

## 2019-03-11 ENCOUNTER — Ambulatory Visit (INDEPENDENT_AMBULATORY_CARE_PROVIDER_SITE_OTHER): Payer: 59 | Admitting: Family Medicine

## 2019-03-11 VITALS — BP 108/72 | HR 75 | Ht 63.0 in | Wt 231.0 lb

## 2019-03-11 DIAGNOSIS — M67462 Ganglion, left knee: Secondary | ICD-10-CM | POA: Diagnosis not present

## 2019-03-11 DIAGNOSIS — M79605 Pain in left leg: Secondary | ICD-10-CM

## 2019-03-11 DIAGNOSIS — S76312D Strain of muscle, fascia and tendon of the posterior muscle group at thigh level, left thigh, subsequent encounter: Secondary | ICD-10-CM | POA: Diagnosis not present

## 2019-03-11 NOTE — Assessment & Plan Note (Signed)
Tear noted.  Patient did have what appeared to be ganglion cyst and had an aspiration.  We discussed which activities of doing which was to avoid.  follow-up again in 4 to 8 weeks

## 2019-03-11 NOTE — Patient Instructions (Signed)
Drained ganglion cyst Continue with thigh compression sleeve See me again in 2 weeks

## 2019-03-11 NOTE — Assessment & Plan Note (Signed)
Appears to be more ganglion.  Truly likely not secondary to the acute tear.  Patient does have the pulse return we will need to monitor.  Discussed icing regimen and home exercise.  Discussed which activities of doing which wants to avoid.  Follow-up again in 4 to 8 weeks

## 2019-03-23 ENCOUNTER — Other Ambulatory Visit: Payer: Self-pay | Admitting: Family Medicine

## 2019-03-26 NOTE — Progress Notes (Signed)
Tawana ScaleZach Lacheryl Niesen D.O. [facility] [address] [place], [place] [address] Phone: [phone] Subjective:   I Ronelle NighKana Springborn am serving as a Neurosurgeonscribe for Dr. Antoine PrimasZachary Naydeline Morace.  I'm seeing this patient by the request  of:    CC: Hip and knee pain follow-up  BJY:NWGNFAOZHYHPI:Subjective  Olivia Barnes is a 63 y.o. female coming in with complaint of hip and knee pain. States she is doing well. Knee is achy and her hip is doing better.  Patient describes the pain is still dull and aching.  Sometimes is a catching sensation.  For 2 weeks had difficulty even moving the knee at all.  Now seems to be doing a little better.  Patient feels out of it aching on the medial aspect of the knee seems to be worsening.     Past Medical History:  Diagnosis Date  . Hyperlipemia   . Hypertension    Past Surgical History:  Procedure Laterality Date  . 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 Marital status: Married    Spouse name: Not on file  . Number of children: Not on file  . Years of education: Not on file  . Highest education level: Not on file  Occupational History  . Not on file  Social Needs  . Financial resource strain: Not on file  . Food insecurity    Worry: Not on file    Inability: Not on file  . Transportation needs    Medical: Not on file    Non-medical: Not on file  Tobacco Use  . Smoking status: Unknown If Ever Smoked  . Smokeless tobacco: Never Used  Substance and Sexual Activity  . Alcohol use: Not on file  . Drug use: Not on file  . Sexual activity: Not on file  Lifestyle  . Physical activity    Days per week: Not on file    Minutes per session: Not on file  . Stress: Not on file  Relationships  . Social Musicianconnections    Talks on phone: Not on file    Gets together: Not on file    Attends religious service: Not on file    Active member of club or organization: Not on file    Attends meetings of clubs or  organizations: Not on file    Relationship status: Not on file  Other Topics Concern  . Not on file  Social History Narrative  . Not on file   Allergies  Allergen Reactions  . Gabapentin Nausea And Vomiting  . Penicillins Rash   No family history on file.   Current Outpatient Medications (Cardiovascular):  .  hydrochlorothiazide (MICROZIDE) 12.5 MG capsule, Take 12.5 mg by mouth daily.     Current Outpatient Medications (Other):  [name].  Diclofenac Sodium 2 % SOLN, Place 2 g onto the skin 2 (two) times daily. .  nortriptyline (PAMELOR) 10 MG capsule, Take 1 capsule (10 mg total) by mouth at bedtime. [name].  tiZANidine (ZANAFLEX) 4 MG tablet, TAKE 1 TABLET BY MOUTH EVERY NIGHT AT BEDTIME .  UNKNOWN TO PATIENT,  .  Vitamin D, Ergocalciferol, (DRISDOL) 1.25 MG (50000 UT) CAPS capsule, Take 1 capsule (50,000 Units total) by mouth every 7 (seven) days.    Past medical history, social, surgical and family history all reviewed in electronic medical record.  No pertanent information unless stated regarding to the chief complaint.   Review of Systems:  No headache,  visual changes, nausea, vomiting, diarrhea, constipation, dizziness, abdominal pain, skin rash, fevers, chills, night sweats, weight loss, swollen lymph nodes, body aches, joint swelling,  chest pain, shortness of breath, mood changes.  Positive muscle aches  Objective  Blood pressure 114/80, pulse 64, height 5\' 3"  (1.6 m), weight 227 lb (103 kg), SpO2 98 %.    General: No apparent distress alert and oriented x3 mood and affect normal, dressed appropriately.  HEENT: Pupils equal, extraocular movements intact  Respiratory: Patient's speak in full sentences and does not appear short of breath  Cardiovascular: trace lower extremity edema, non tender, no erythema  Skin: Warm dry intact with no signs of infection or rash on extremities or on axial skeleton.  Abdomen: Soft nontender  Neuro: Cranial nerves II through XII are intact,  neurovascularly intact in all extremities with 2+ DTRs and 2+ pulses.  Lymph: No lymphadenopathy of posterior or anterior cervical chain or axillae bilaterally.  Gait antalgic gait MSK:  tender with full range of motion and good stability and symmetric strength and tone of shoulders, elbows, wrist, hip and ankles bilaterally.  Left knee exam does have some instability.  Increasing discomfort noted on the knee medial aspect. Positive McMurray's which is different.  Severe pain with resisted flexion of the knee more over the distal hamstring still noted.    Impression and Recommendations:     This case required medical decision making of moderate complexity. The above documentation has been reviewed and is accurate and complete Lyndal Pulley, DO       Note: This dictation was prepared with Dragon dictation along with smaller phrase technology. Any transcriptional errors that result from this process are unintentional.

## 2019-03-28 ENCOUNTER — Ambulatory Visit (INDEPENDENT_AMBULATORY_CARE_PROVIDER_SITE_OTHER): Payer: 59 | Admitting: Family Medicine

## 2019-03-28 ENCOUNTER — Encounter: Payer: Self-pay | Admitting: Family Medicine

## 2019-03-28 ENCOUNTER — Other Ambulatory Visit: Payer: Self-pay

## 2019-03-28 VITALS — BP 114/80 | HR 64 | Ht 63.0 in | Wt 227.0 lb

## 2019-03-28 DIAGNOSIS — M67462 Ganglion, left knee: Secondary | ICD-10-CM

## 2019-03-28 DIAGNOSIS — S76312D Strain of muscle, fascia and tendon of the posterior muscle group at thigh level, left thigh, subsequent encounter: Secondary | ICD-10-CM

## 2019-03-28 DIAGNOSIS — G8929 Other chronic pain: Secondary | ICD-10-CM

## 2019-03-28 DIAGNOSIS — M25562 Pain in left knee: Secondary | ICD-10-CM | POA: Diagnosis not present

## 2019-03-28 NOTE — Assessment & Plan Note (Signed)
Patient does have what appears to be.  Patient could potentially have meniscal injury.  I do feel MRI is necessary with patient instability and locking that is occurring.  We discussed after the MRI that there could be possibility of need for surgical intervention. This does well and if he goes intra-articular this will need to concern.  Patient will follow-up after imaging

## 2019-03-28 NOTE — Patient Instructions (Addendum)
Good to see you MRI ordered call [facility] to schedule [phone]

## 2019-03-28 NOTE — Assessment & Plan Note (Signed)
Further evaluated..  Patient has not made significant improvement.

## 2019-04-07 ENCOUNTER — Other Ambulatory Visit: Payer: Self-pay | Admitting: Family Medicine

## 2019-04-22 ENCOUNTER — Ambulatory Visit
Admission: RE | Admit: 2019-04-22 | Discharge: 2019-04-22 | Disposition: A | Discharge status: Home / Self Care | Payer: 59 | Source: Ambulatory Visit | Attending: Family Medicine | Admitting: Family Medicine

## 2019-04-22 ENCOUNTER — Other Ambulatory Visit: Payer: Self-pay

## 2019-04-22 DIAGNOSIS — G8929 Other chronic pain: Secondary | ICD-10-CM

## 2019-04-22 IMAGING — MR MRI OF THE LEFT KNEE WITHOUT CONTRAST
6 series · 37 of 40 positions shown · non-contrast
Comparison: None.

CLINICAL DATA: Chronic persistent medial knee pain. Painful range
of motion.

EXAM:
MRI OF THE LEFT KNEE WITHOUT CONTRAST
TECHNIQUE: Multiplanar, multisequence MR imaging of the knee was performed. No
intravenous contrast was administered.

[Series 5: T2 fat-sat · axial · left · 4.0mm · 0.50mm/px · z∈[-57,+70]mm · 7 of 30 slices shown (1 of 3)]
[im 1/30]
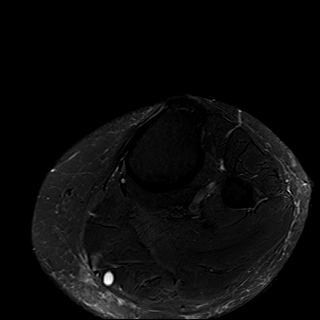
[im 5/30]
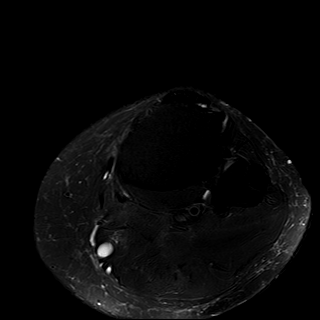
[im 10/30]
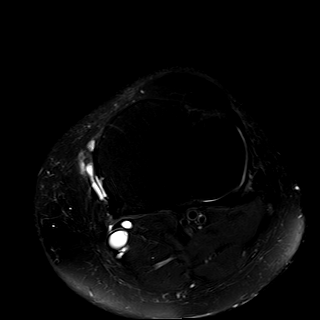
[im 15/30]
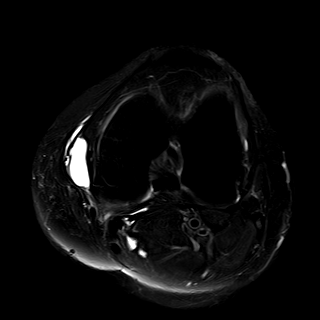
[im 20/30]
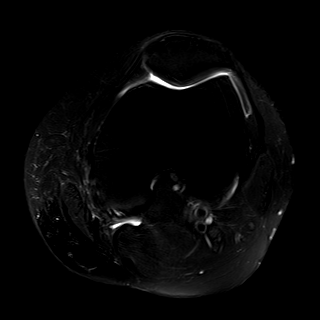
[im 25/30]
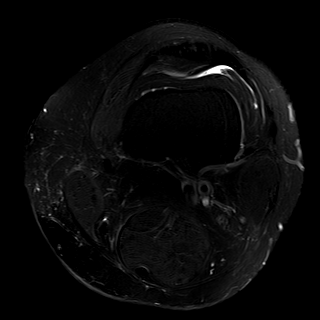
[im 30/30]
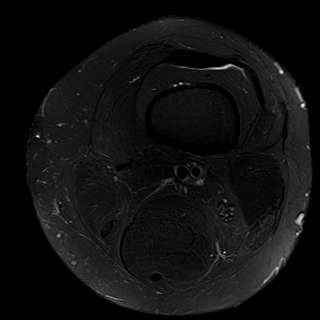

[Series 6: T2 fat-sat · coronal · left · 4.0mm · 0.39mm/px · 7 of 28 slices shown (2 of 3)]
[im 1/28]
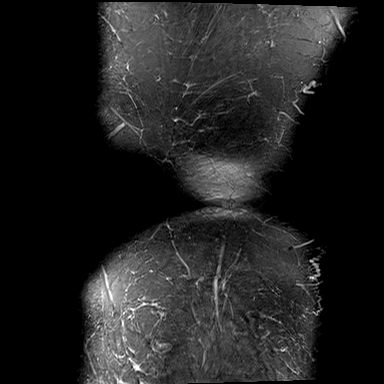
[im 5/28]
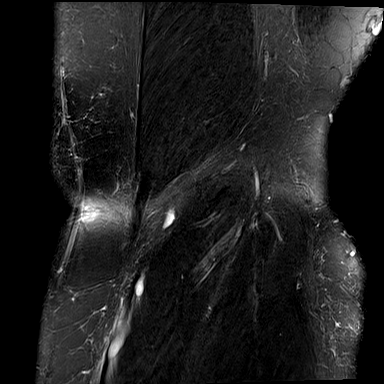
[im 10/28]
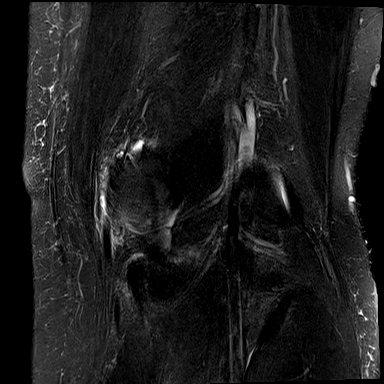
[im 14/28]
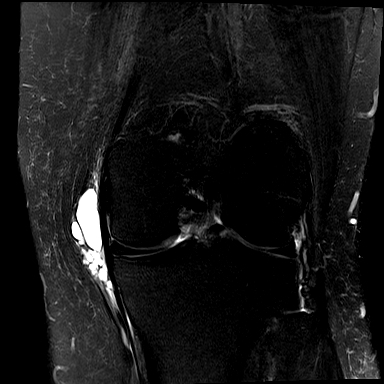
[im 19/28]
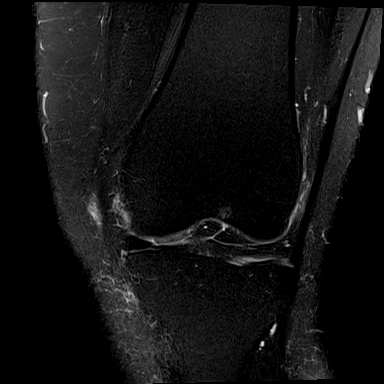
[im 23/28]
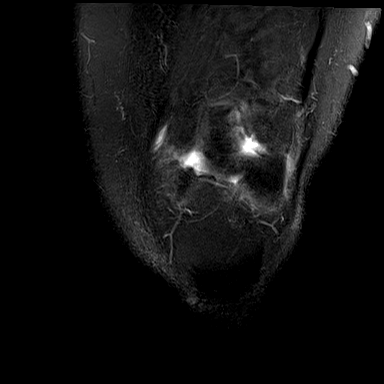
[im 28/28]
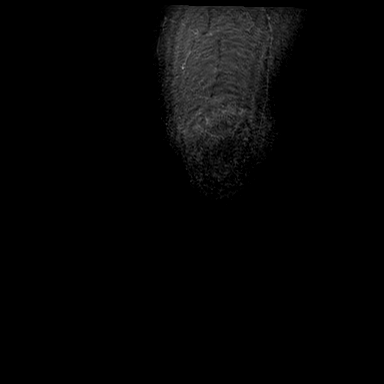

[Series 7: T1 · coronal · left · 4.0mm · 0.39mm/px · 4 of 28 slices shown]
[im 1/28]
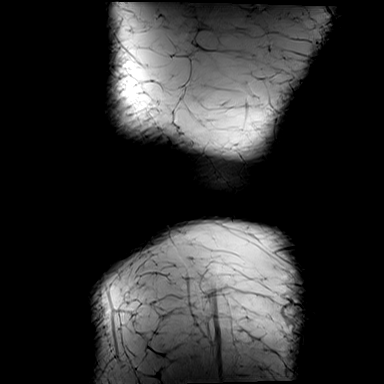
[im 5/28]
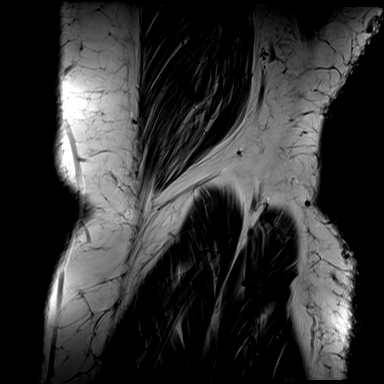
[im 10/28]
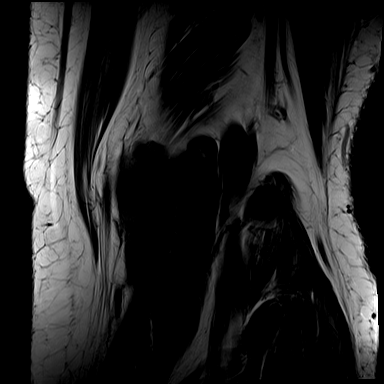
[im 14/28]
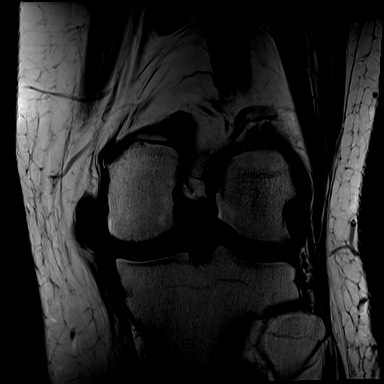

[Series 8: PD fat-sat · coronal · left · 3.0mm · 0.47mm/px · 7 of 28 slices shown (1 of 2)]
[im 1/28]
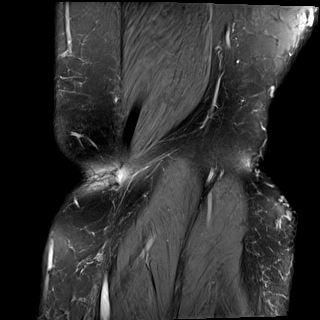
[im 5/28]
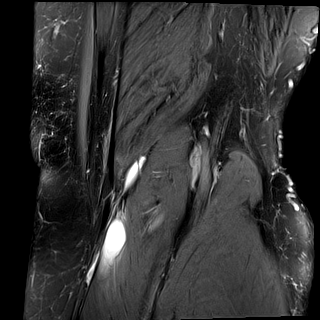
[im 10/28]
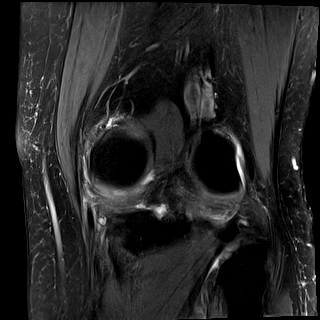
[im 14/28]
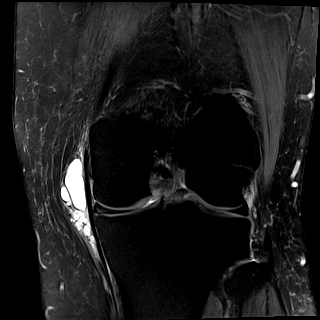
[im 19/28]
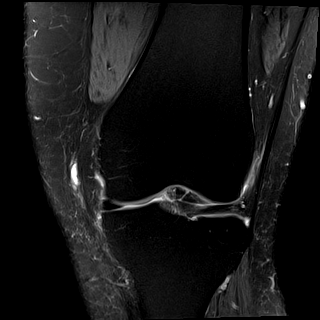
[im 23/28]
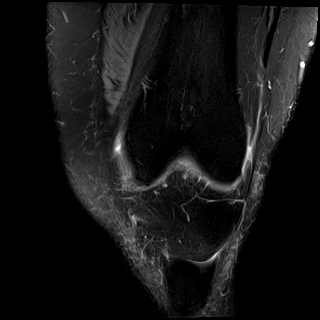
[im 28/28]
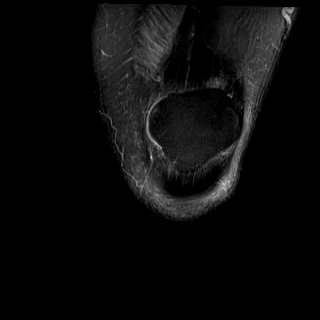

[Series 9: PD fat-sat · sagittal · left · 3.0mm · 0.39mm/px · 6 of 27 slices shown (2 of 2)]
[im 1/27]
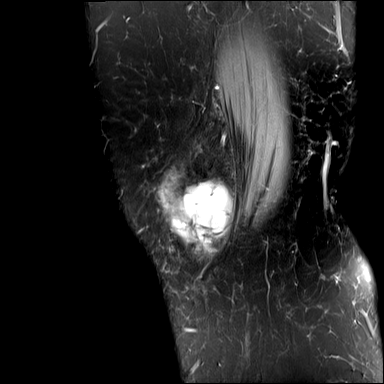
[im 6/27]
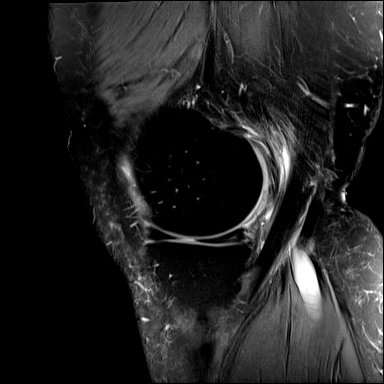
[im 11/27]
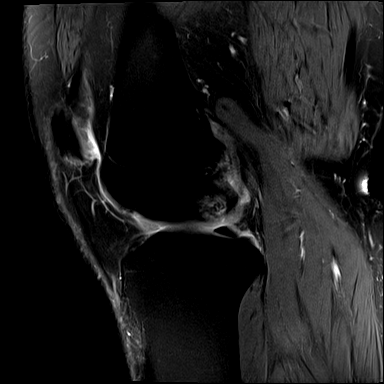
[im 16/27]
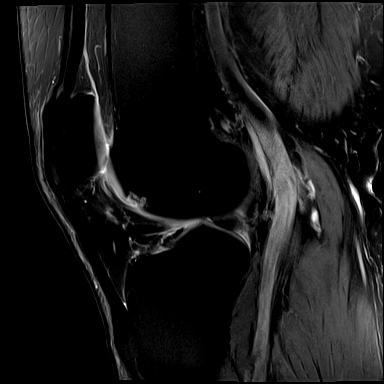
[im 21/27]
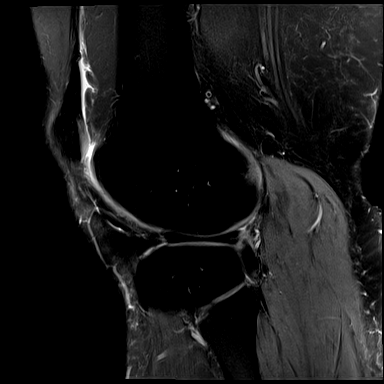
[im 27/27]
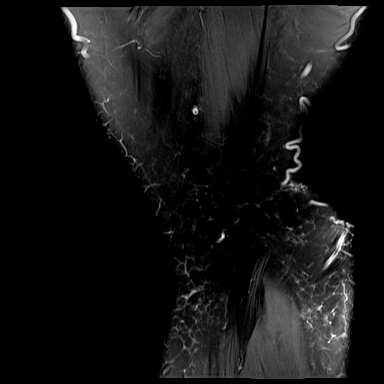

[Series 10: T2 fat-sat · sagittal · left · 3.0mm · 0.39mm/px · 6 of 27 slices shown (3 of 3)]
[im 1/27]
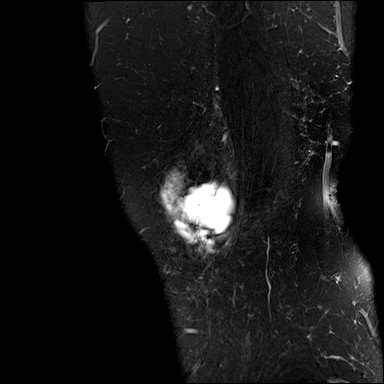
[im 6/27]
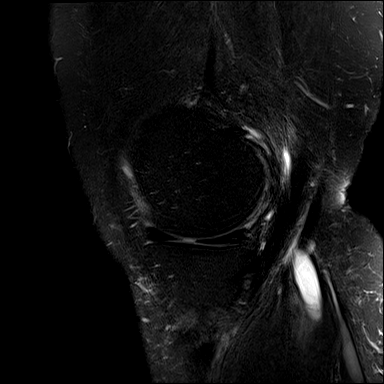
[im 11/27]
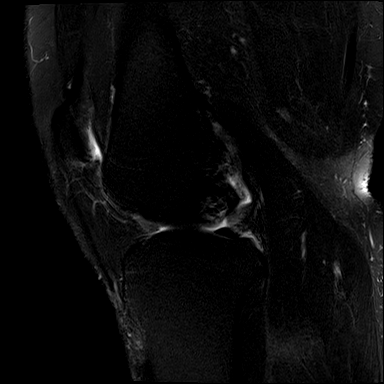
[im 16/27]
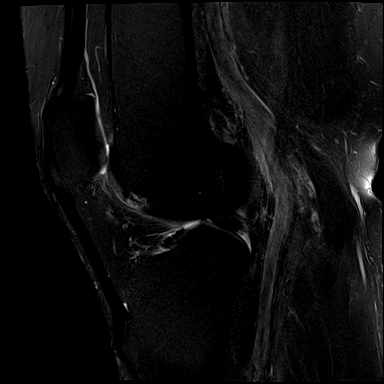
[im 21/27]
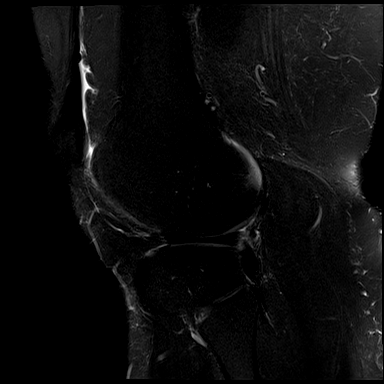
[im 27/27]
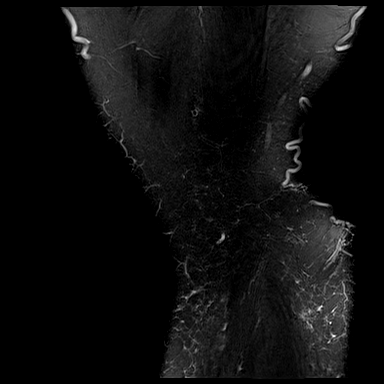

[37 of 40 positions shown; findings below may reference images not displayed]

FINDINGS: MENISCI

Medial meniscus:  Intact.

Lateral meniscus: Irregular incomplete radial tear of the posterior
horn best seen on series number 9 and on image 19 of series 5.

LIGAMENTS

Cruciates:  Normal.

Collaterals: Intact. There is a complex 4.1 by 3.7 x 11.3 mm
ganglion cyst superficial to the MCL. A portion of the cyst extends
superficial to the medial patellofemoral ligament. This cyst extends
deep to the pes anserine tendons. The site of origin is not
identified.

CARTILAGE

Patellofemoral: 8 mm area of full-thickness cartilage loss and
subcortical edema in the lateral aspect of the trochlear groove of
the distal femur. The cartilage of the patella appears intact.

Medial:  Normal.

Lateral:  Normal.

Joint: Trace joint effusion. Normal Hoffa's fat pad. No plical
thickening.

Popliteal Fossa: Small complex Baker's cyst extending distally with
in the medial head of the gastrocnemius muscle the cyst is at least
9 cm in length but only 12 mm maximum diameter.

Extensor Mechanism:  Normal.

Bones:  Normal.

Other: None
IMPRESSION: 1. Complex prominent ganglion cyst superficial to the medial
collateral ligament. No appreciable underlying abnormality of the
medial collateral ligament or medial meniscus. I suspect this
ganglion cyst originates from the Baker's cyst.
2. Dissecting Baker's cyst extending distally with in the medial
head of the gastrocnemius muscle.
3. Focal incomplete radial tear of the posterior horn of the lateral
meniscus.
4. Focal cartilage loss in the lateral aspect of the trochlear
groove of the distal femur.

## 2019-04-26 NOTE — Progress Notes (Signed)
Tawana ScaleZach Smith D.O. [facility] [address] [place], [place] [address] Phone: [phone] Subjective:   [name], Valerie Wolf, am serving as a scribe for Dr. Antoine PrimasZachary Smith.  I'm seeing this patient by the request  of:    CC: Left knee pain follow-up  BJY:NWGNFAOZHYHPI:Subjective  Alfredo Battyerri Hatler is a 63 y.o. female coming in with complaint of left knee pain.  Failed all conservative therapy.   MRI of the knee 1. Complex prominent ganglion cyst superficial to the medial  collateral ligament.  I suspect this  ganglion cyst originates from the Baker's cyst.  2. Dissecting Baker's cyst extending distally with in the medial  head of the gastrocnemius muscle.  3. Focal incomplete radial tear of the posterior horn of the lateral  meniscus.  4. Focal cartilage loss in the lateral aspect of the trochlear  groove of the distal femur.   Past Medical History:  Diagnosis Date  . Hyperlipemia   . Hypertension    Past Surgical History:  Procedure Laterality Date  . 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 Marital status: Married    Spouse name: Not on file  . Number of children: Not on file  . Years of education: Not on file  . Highest education level: Not on file  Occupational History  . Not on file  Social Needs  . Financial resource strain: Not on file  . Food insecurity    Worry: Not on file    Inability: Not on file  . Transportation needs    Medical: Not on file    Non-medical: Not on file  Tobacco Use  . Smoking status: Unknown If Ever Smoked  . Smokeless tobacco: Never Used  Substance and Sexual Activity  . Alcohol use: Not on file  . Drug use: Not on file  . Sexual activity: Not on file  Lifestyle  . Physical activity    Days per week: Not on file    Minutes per session: Not on file  . Stress: Not on file  Relationships  . Social Musicianconnections    Talks on phone: Not on file    Gets together: Not on file   Attends religious service: Not on file    Active member of club or organization: Not on file    Attends meetings of clubs or organizations: Not on file    Relationship status: Not on file  Other Topics Concern  . Not on file  Social History Narrative  . Not on file   Allergies  Allergen Reactions  . Gabapentin Nausea And Vomiting  . Penicillins Rash   No family history on file.   Current Outpatient Medications (Cardiovascular):  .  hydrochlorothiazide (MICROZIDE) 12.5 MG capsule, Take 12.5 mg by mouth daily.     Current Outpatient Medications (Other):  [name].  Diclofenac Sodium 2 % SOLN, Place 2 g onto the skin 2 (two) times daily. .  nortriptyline (PAMELOR) 10 MG capsule, Take 1 capsule (10 mg total) by mouth at bedtime. [name].  tiZANidine (ZANAFLEX) 4 MG tablet, TAKE 1 TABLET BY MOUTH EVERY NIGHT AT BEDTIME .  UNKNOWN TO PATIENT,  .  Vitamin D, Ergocalciferol, (DRISDOL) 1.25 MG (50000 UT) CAPS capsule, Take 1 capsule (50,000 Units total) by mouth every 7 (seven) days.    Past medical history, social, surgical and family history all reviewed in electronic medical record.  No pertanent information unless stated regarding to the  chief complaint.   Review of Systems:  No headache, visual changes, nausea, vomiting, diarrhea, constipation, dizziness, abdominal pain, skin rash, fevers, chills, night sweats, weight loss, swollen lymph nodes, , chest pain, shortness of breath, mood changes.  Positive muscle aches, intermittent joint swelling  Objective  Blood pressure 116/62, height 5\' 3"  (1.6 m), weight 227 lb (103 kg).   General: No apparent distress alert and oriented x3 mood and affect normal, dressed appropriately.  HEENT: Pupils equal, extraocular movements intact  Respiratory: Patient's speak in full sentences and does not appear short of breath  Cardiovascular: No lower extremity edema, non tender, no erythema  Skin: Warm dry intact with no signs of infection or rash on extremities  or on axial skeleton.  Abdomen: Soft nontender  Neuro: Cranial nerves II through XII are intact, neurovascularly intact in all extremities with 2+ DTRs and 2+ pulses.  Lymph: No lymphadenopathy of posterior or anterior cervical chain or axillae bilaterally.  Gait antalgic MSK:  Non tender with full range of motion and good stability and symmetric strength and tone of shoulders, elbows, wrist, hip, and ankles bilaterally.  Left knee exam is severely tender to palpation over the medial joint space and does have a fullness in this area.  Baker's cyst is palpated and is somewhat tender but very small in comparison to the MRI findings.  Full extension and full flexion.  Positive McMurray's.  Procedure: Real-time Ultrasound Guided aspiration and injection of left knee Device: GE Logiq Q7 Ultrasound guided injection is preferred based studies that show increased duration, increased effect, greater accuracy, decreased procedural pain, increased response rate, and decreased cost with ultrasound guided versus blind injection.  Verbal informed consent obtained.  Time-out conducted.  Noted no overlying erythema, induration, or other signs of local infection.  Skin prepped in a sterile fashion.  Local anesthesia: Topical Ethyl chloride.  With sterile technique and under real time ultrasound guidance: With a 22-gauge 2 inch needle patient was injected with 4 cc of 0.5% Marcaine and aspirated jellylike fluid then injected 1 cc of Kenalog 40 mg/dL. This was from a medial approach.  Completed but difficulty due to the thickness of the gel.  With pushing unable to express any more at this time. Pain immediately improved but not resolved Advised to call if fevers/chills, erythema, induration, drainage, or persistent bleeding.  Images permanently stored and available for review in the ultrasound unit.  Impression: Technically successful ultrasound guided injection.    Impression and Recommendations:     This  case required medical decision making of moderate complexity. The above documentation has been reviewed and is accurate and complete Lyndal Pulley, DO       Note: This dictation was prepared with Dragon dictation along with smaller phrase technology. Any transcriptional errors that result from this process are unintentional.

## 2019-04-27 ENCOUNTER — Encounter: Payer: Self-pay | Admitting: Family Medicine

## 2019-04-27 ENCOUNTER — Ambulatory Visit: Payer: Self-pay

## 2019-04-27 ENCOUNTER — Ambulatory Visit: Payer: 59 | Admitting: Family Medicine

## 2019-04-27 ENCOUNTER — Other Ambulatory Visit: Payer: Self-pay

## 2019-04-27 VITALS — BP 116/62 | Ht 63.0 in | Wt 227.0 lb

## 2019-04-27 DIAGNOSIS — G8929 Other chronic pain: Secondary | ICD-10-CM

## 2019-04-27 DIAGNOSIS — M25562 Pain in left knee: Secondary | ICD-10-CM | POA: Diagnosis not present

## 2019-04-27 DIAGNOSIS — M67462 Ganglion, left knee: Secondary | ICD-10-CM

## 2019-04-27 NOTE — Patient Instructions (Addendum)
Good to see you  Really hopeful this will make a difference  Compresson sleeve could be a good idea.  [facility] will call you-Dr. Mayer Camel or Berenice Primas

## 2019-04-27 NOTE — Assessment & Plan Note (Signed)
Ganglion cyst unable to aspirate secondary to the thickness.  With this as well as a meniscal tear I do believe that surgical intervention would be best for this individual.  Patient will be referred.  Warned of post aspiration pain.  Has pain medication if needed.

## 2019-05-09 ENCOUNTER — Other Ambulatory Visit: Payer: Self-pay | Admitting: *Deleted

## 2019-05-09 DIAGNOSIS — M67462 Ganglion, left knee: Secondary | ICD-10-CM

## 2019-05-13 ENCOUNTER — Other Ambulatory Visit: Payer: Self-pay | Admitting: Family Medicine

## 2019-07-29 ENCOUNTER — Other Ambulatory Visit: Payer: Self-pay | Admitting: Family Medicine

## 2019-08-15 ENCOUNTER — Other Ambulatory Visit: Payer: Self-pay | Admitting: Family Medicine

## 2019-08-16 ENCOUNTER — Other Ambulatory Visit: Payer: Self-pay | Admitting: *Deleted

## 2019-08-16 MED ORDER — PENNSAID 2 % EX SOLN: 2.0000 | Freq: Two times a day (BID) | CUTANEOUS | 1 refills | Status: DC | PRN

## 2019-08-16 NOTE — Telephone Encounter (Signed)
Summer from Pharmacy called for a status update on refill request.

## 2019-08-23 ENCOUNTER — Other Ambulatory Visit: Payer: Self-pay

## 2019-08-23 MED ORDER — PENNSAID 2 % EX SOLN: 2.0000 | Freq: Two times a day (BID) | CUTANEOUS | 1 refills | Status: DC | PRN

## 2019-09-30 ENCOUNTER — Ambulatory Visit (INDEPENDENT_AMBULATORY_CARE_PROVIDER_SITE_OTHER): Payer: 59

## 2019-09-30 ENCOUNTER — Encounter: Payer: Self-pay | Admitting: Family Medicine

## 2019-09-30 ENCOUNTER — Other Ambulatory Visit: Payer: Self-pay

## 2019-09-30 ENCOUNTER — Ambulatory Visit: Payer: 59 | Admitting: Family Medicine

## 2019-09-30 VITALS — BP 110/80 | HR 82 | Ht 63.0 in | Wt 227.0 lb

## 2019-09-30 DIAGNOSIS — G8929 Other chronic pain: Secondary | ICD-10-CM

## 2019-09-30 DIAGNOSIS — M25511 Pain in right shoulder: Secondary | ICD-10-CM | POA: Diagnosis not present

## 2019-09-30 DIAGNOSIS — M67462 Ganglion, left knee: Secondary | ICD-10-CM

## 2019-09-30 DIAGNOSIS — M7122 Synovial cyst of popliteal space [Baker], left knee: Secondary | ICD-10-CM

## 2019-09-30 NOTE — Assessment & Plan Note (Signed)
Aspiration done today.  Tolerated the procedure well.  Discussed with patient about the possibility of viscosupplementation secondary to the underlying arthritis that could be contributing.  Patient though does not have a knee replacement at this time she can help it.  We discussed other treatment options as well.  Encourage weight loss.  Follow-up again in 4 to 8 weeks.  Chronic problem that will be difficult to treat

## 2019-09-30 NOTE — Patient Instructions (Signed)
Duexis 1 pill 3x a day for the next 6 days Aspirated the knee today Will inject shoulder and knee next visit See me again in 2-3 weeks

## 2019-09-30 NOTE — Assessment & Plan Note (Signed)
Unfortunately ultrasound recurrence is noted.  Patient did have surgical intervention in October.  Discussed with patient in great length.  We discussed the possibility.  We will try aspiration in the next 2 to 3 weeks.  Patient will continue to monitor at the moment.  We did attempt aspiration of Baker's cyst of the knee.  Underlying arthritis will be Likely contributing as well and will need to continue to monitor.

## 2019-09-30 NOTE — Progress Notes (Signed)
[name] [address] [place] [address] Phone: [phone] Subjective:   Olivia Barnes, am serving as a scribe for Dr. Antoine Primas. This visit occurred during the SARS-CoV-2 public health emergency.  Safety protocols were in place, including screening questions prior to the visit, additional usage of staff PPE, and extensive cleaning of exam room while observing appropriate contact time as indicated for disinfecting solutions.   I'm seeing this patient by the request  of:  [name], MD  CC: Left knee pain  [id number]   04/27/2019 Ganglion cyst unable to aspirate secondary to the thickness.  With this as well as a meniscal tear I do believe that surgical intervention would be best for this individual.  Patient will be referred.  Warned of post aspiration pain.  Has pain medication if needed.  Update 09/30/2019 Olivia Barnes is a 64 y.o. female coming in with complaint of right shoulder pain that began 5 days ago. Pain over superior aspect of shoulder that radiates down to the elbow. Pain with flexion and abduction. Denies any tingling. Has been using ice, heat and IBU. Pain increases at night.   Pain on left knee is medial. Had surgery on 06/01/2019 to have cyst removed. Can occasionally have pain over ITB. Did physical therapy for 2 weeks and her pain started over again. Did have steroid injection from Dr. Luiz Blare but that did not ease her pain.  Patient feels like she has the exact same pain previously.  Patient states that she would state that she is only 0 to 5% better.  Still having pain with daily.  Has noticed decreasing range of motion as well.      Past Medical History:  Diagnosis Date  . Hyperlipemia   . Hypertension    Past Surgical History:  Procedure Laterality Date  . 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 Marital status: Married    Spouse name: Not on  file  . Number of children: Not on file  . Years of education: Not on file  . Highest education level: Not on file  Occupational History  . Not on file  Tobacco Use  . Smoking status: Unknown If Ever Smoked  . Smokeless tobacco: Never Used  Substance and Sexual Activity  . Alcohol use: Not on file  . Drug use: Not on file  . Sexual activity: Not on file  Other Topics Concern  . Not on file  Social History Narrative  . Not on file   Social Determinants of Health   Financial Resource Strain:   . Difficulty of Paying Living Expenses: Not on file  Food Insecurity:   . Worried About Programme researcher, broadcasting/film/video in the Last Year: Not on file  . Ran Out of Food in the Last Year: Not on file  Transportation Needs:   . Lack of Transportation (Medical): Not on file  . Lack of Transportation (Non-Medical): Not on file  Physical Activity:   . Days of Exercise per Week: Not on file  . Minutes of Exercise per Session: Not on file  Stress:   . Feeling of Stress : Not on file  Social Connections:   . Frequency of Communication with Friends and Family: Not on file  . Frequency of Social Gatherings with Friends and Family: Not on file  . Attends Religious Services: Not on file  . Active Member of Clubs or Organizations: Not  on file  . Attends Banker Meetings: Not on file  . Marital Status: Not on file   Allergies  Allergen Reactions  . Gabapentin Nausea And Vomiting  . Penicillins Rash   No family history on file.   Current Outpatient Medications (Cardiovascular):  .  hydrochlorothiazide (MICROZIDE) 12.5 MG capsule, Take 12.5 mg by mouth daily.     Current Outpatient Medications (Other):  [name]  Diclofenac Sodium (PENNSAID) 2 % SOLN, Apply 2 Pump topically 2 (two) times daily as needed. .  Diclofenac Sodium 2 % SOLN, Place 2 g onto the skin 2 (two) times daily. .  nortriptyline (PAMELOR) 10 MG capsule, TAKE 1 CAPSULE (10 MG TOTAL) BY MOUTH AT BEDTIME. [name]  tiZANidine  (ZANAFLEX) 4 MG tablet, TAKE 1 TABLET BY MOUTH EVERYDAY AT BEDTIME .  UNKNOWN TO PATIENT,  .  Vitamin D, Ergocalciferol, (DRISDOL) 1.25 MG (50000 UT) CAPS capsule, Take 1 capsule (50,000 Units total) by mouth every 7 (seven) days.   Reviewed prior external information including notes and imaging from  primary care provider As well as notes that were available from care everywhere and other healthcare systems.  Past medical history, social, surgical and family history all reviewed in electronic medical record.  No pertanent information unless stated regarding to the chief complaint.   Review of Systems:  No headache, visual changes, nausea, vomiting, diarrhea, constipation, dizziness, abdominal pain, skin rash, fevers, chills, night sweats, weight loss, swollen lymph nodes, body aches, joint swelling, chest pain, shortness of breath, mood changes. POSITIVE muscle aches  Objective  Blood pressure 110/80, pulse 82, height 5\' 3"  (1.6 m), weight 227 lb (103 kg), SpO2 99 %.   General: No apparent distress alert and oriented x3 mood and affect normal, dressed appropriately.  HEENT: Pupils equal, extraocular movements intact  Respiratory: Patient's speak in full sentences and does not appear short of breath  Cardiovascular: No lower extremity edema, non tender, no erythema  Skin: Warm dry intact with no signs of infection or rash on extremities or on axial skeleton.  Abdomen: Soft nontender  Neuro: Cranial nerves II through XII are intact, neurovascularly intact in all extremities with 2+ DTRs and 2+ pulses.  Lymph: No lymphadenopathy of posterior or anterior cervical chain or axillae bilaterally.  Gait antalgic MSK:  tender with mild limited range of motion and good stability and symmetric strength and tone of shoulders, elbows, wrist, hip, and ankles bilaterally.  Left knee exam still shows some mild arthritic changes.  Mild crepitus with range of motion.  Lacks the last 15 degrees of flexion.   Lacks the last 2 degrees of extension.  Severely tender over the medial joint line.  Cyst palpated in the popliteal area as well as on the lateral aspect of the knee.  Limited musculoskeletal ultrasound was performed and interpreted by  Limited ultrasound of patient's knee shows that patient does have a complex ganglion cyst superficial to the MCL noted again.  Questionably smaller than previous exam definitely still appreciated.  Baker's cyst noted.  Postsurgical changes of the lateral meniscus noted Impression: Continued ganglion cyst and Baker's cyst  Procedure: Real-time Ultrasound Guided Injection of left knee Baker's cyst Device: GE Logiq Q7 Ultrasound guided injection is preferred based studies that show increased duration, increased effect, greater accuracy, decreased procedural pain, increased response rate, and decreased cost with ultrasound guided versus blind injection.  Verbal informed consent obtained.  Time-out conducted.  Noted no overlying erythema, induration, or other signs of  local infection.  Skin prepped in a sterile fashion.  Local anesthesia: Topical Ethyl chloride.  With sterile technique and under real time ultrasound guidance: With a 22-gauge 2 inch needle patient was injected with 4 cc of 0.5% Marcaine and aspirated 20 cc of straw-colored fluid then injected 1 cc of Kenalog 40 mg/dL. This was from a posterior approach.  Completed without difficulty  Pain improved suggesting accurate placement of the medication.  Advised to call if fevers/chills, erythema, induration, drainage, or persistent bleeding.  Images permanently stored and available for review in the ultrasound unit.  Impression: Technically successful ultrasound guided injection.   Impression and Recommendations:     This case required medical decision making of moderate complexity. The above documentation has been reviewed and is accurate and complete Lyndal Pulley, DO       Note:  This dictation was prepared with Dragon dictation along with smaller phrase technology. Any transcriptional errors that result from this process are unintentional.

## 2019-10-27 ENCOUNTER — Other Ambulatory Visit: Payer: Self-pay

## 2019-10-27 ENCOUNTER — Ambulatory Visit (INDEPENDENT_AMBULATORY_CARE_PROVIDER_SITE_OTHER): Payer: 59

## 2019-10-27 ENCOUNTER — Encounter: Payer: Self-pay | Admitting: Family Medicine

## 2019-10-27 ENCOUNTER — Ambulatory Visit: Payer: 59 | Admitting: Family Medicine

## 2019-10-27 VITALS — BP 116/82 | HR 67 | Ht 63.0 in | Wt 234.0 lb

## 2019-10-27 DIAGNOSIS — M67462 Ganglion, left knee: Secondary | ICD-10-CM

## 2019-10-27 DIAGNOSIS — M25511 Pain in right shoulder: Secondary | ICD-10-CM | POA: Diagnosis not present

## 2019-10-27 DIAGNOSIS — G8929 Other chronic pain: Secondary | ICD-10-CM | POA: Diagnosis not present

## 2019-10-27 DIAGNOSIS — M7122 Synovial cyst of popliteal space [Baker], left knee: Secondary | ICD-10-CM

## 2019-10-27 NOTE — Assessment & Plan Note (Signed)
Noted again. 

## 2019-10-27 NOTE — Assessment & Plan Note (Signed)
Attempted aspiration done today.  I do feel that after this we need to try again for multiple angles.  Patient will be scheduled in 1 month for another potential.  Discussed posture and ergonomics, discussed compression, follow-up again in 4 to 8 weeks

## 2019-10-27 NOTE — Progress Notes (Signed)
[place] Phone: [phone] Subjective:   [name], am serving as a scribe for Dr. Hulan Saas. This visit occurred during the SARS-CoV-2 public health emergency.  Safety protocols were in place, including screening questions prior to the visit, additional usage of staff PPE, and extensive cleaning of exam room while observing appropriate contact time as indicated for disinfecting solutions.   I'm seeing this patient by the request  of:  Charleston Poot, MD  CC: Left knee pain follow-up  [id number]   09/30/2019 Aspiration done today.  Tolerated the procedure well.  Discussed with patient about the possibility of viscosupplementation secondary to the underlying arthritis that could be contributing.  Patient though does not have a knee replacement at this time she can help it.  We discussed other treatment options as well.  Encourage weight loss.  Follow-up again in 4 to 8 weeks.  Chronic problem that will be difficult to treat  Unfortunately ultrasound recurrence is noted.  Patient did have surgical intervention in October.  Discussed with patient in great length.  We discussed the possibility.  We will try aspiration in the next 2 to 3 weeks.  Patient will continue to monitor at the moment.  We did attempt aspiration of Baker's cyst of the knee.  Underlying arthritis will be Likely contributing as well and will need to continue to monitor.  Update 10/27/2019 Olivia Barnes is a 64 y.o. female coming in with complaint of right shoulder and left knee pain. Patient states that she has constant left knee pain. Pain over medial and lateral aspect.   Right shoulder pain over the middle deltoid especially with flexion. Uses Aleve prn.  Patient feels though that it is improving slowly.  Not as much pain as previously.        Past Medical History:  Diagnosis Date  . Hyperlipemia   . Hypertension    Past Surgical  History:  Procedure Laterality Date  . 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 Marital status: Married    Spouse name: Not on file  . Number of children: Not on file  . Years of education: Not on file  . Highest education level: Not on file  Occupational History  . Not on file  Tobacco Use  . Smoking status: Unknown If Ever Smoked  . Smokeless tobacco: Never Used  Substance and Sexual Activity  . Alcohol use: Not on file  . Drug use: Not on file  . Sexual activity: Not on file  Other Topics Concern  . Not on file  Social History Narrative  . Not on file   Social Determinants of Health   Financial Resource Strain:   . Difficulty of Paying Living Expenses: Not on file  Food Insecurity:   . Worried About Charity fundraiser in the Last Year: Not on file  . Ran Out of Food in the Last Year: Not on file  Transportation Needs:   . Lack of Transportation (Medical): Not on file  . Lack of Transportation (Non-Medical): Not on file  Physical Activity:   . Days of Exercise per Week: Not on file  . Minutes of Exercise per Session: Not on file  Stress:   . Feeling of Stress : Not on file  Social Connections:   . Frequency of Communication with Friends and Family: Not on file  . Frequency of Social  Gatherings with Friends and Family: Not on file  . Attends Religious Services: Not on file  . Active Member of Clubs or Organizations: Not on file  . Attends Banker Meetings: Not on file  . Marital Status: Not on file   Allergies  Allergen Reactions  . Gabapentin Nausea And Vomiting  . Penicillins Rash   No family history on file.   Current Outpatient Medications (Cardiovascular):  .  hydrochlorothiazide (MICROZIDE) 12.5 MG capsule, Take 12.5 mg by mouth daily.     Current Outpatient Medications (Other):  [name]  Diclofenac Sodium (PENNSAID) 2 % SOLN, Apply 2 Pump topically 2 (two) times daily as  needed. .  Diclofenac Sodium 2 % SOLN, Place 2 g onto the skin 2 (two) times daily. .  nortriptyline (PAMELOR) 10 MG capsule, TAKE 1 CAPSULE (10 MG TOTAL) BY MOUTH AT BEDTIME. [name]  tiZANidine (ZANAFLEX) 4 MG tablet, TAKE 1 TABLET BY MOUTH EVERYDAY AT BEDTIME .  UNKNOWN TO PATIENT,  .  Vitamin D, Ergocalciferol, (DRISDOL) 1.25 MG (50000 UT) CAPS capsule, Take 1 capsule (50,000 Units total) by mouth every 7 (seven) days.   Reviewed prior external information including notes and imaging from  primary care provider As well as notes that were available from care everywhere and other healthcare systems.  Past medical history, social, surgical and family history all reviewed in electronic medical record.  No pertanent information unless stated regarding to the chief complaint.   Review of Systems:  No headache, visual changes, nausea, vomiting, diarrhea, constipation, dizziness, abdominal pain, skin rash, fevers, chills, night sweats, weight loss, swollen lymph nodes, body aches, joint swelling, chest pain, shortness of breath, mood changes. POSITIVE muscle aches  Objective  Blood pressure 116/82, pulse 67, height 5\' 3"  (1.6 m), weight 234 lb (106.1 kg), SpO2 98 %.   General: No apparent distress alert and oriented x3 mood and affect normal, dressed appropriately.  HEENT: Pupils equal, extraocular movements intact  Respiratory: Patient's speak in full sentences and does not appear short of breath  Cardiovascular: Trace lower extremity edema, non tender, no erythema  Skin: Warm dry intact with no signs of infection or rash on extremities or on axial skeleton.  Abdomen: Soft nontender  Neuro: Cranial nerves II through XII are intact, neurovascularly intact in all extremities with 2+ DTRs and 2+ pulses.  Lymph: No lymphadenopathy of posterior or anterior cervical chain or axillae bilaterally.   MSK: Antalgic gait noted.  Patient's left knee still has severe tenderness to palpation over the medial  joint line.  Cyst palpated.  Patient also has enlargement of the Baker's cyst again.  Still some mild decrease in range of motion.  Procedure: Real-time Ultrasound Guided Injection of left ganglion cyst Device: GE Logiq Q7 Ultrasound guided injection is preferred based studies that show increased duration, increased effect, greater accuracy, decreased procedural pain, increased response rate, and decreased cost with ultrasound guided versus blind injection.  Verbal informed consent obtained.  Time-out conducted.  Noted no overlying erythema, induration, or other signs of local infection.  Skin prepped in a sterile fashion.  Local anesthesia: Topical Ethyl chloride.  With sterile technique and under real time ultrasound guidance: Attempted aspiration from a posterior approach with 20 cc of straw-colored fluid but no gel-like material removed today.  1 cc of Kenalog 40 mg/mL used Completed without difficulty  Pain immediately resolved suggesting accurate placement of the medication.  Advised to call if fevers/chills, erythema, induration, drainage, or persistent bleeding.  Images permanently stored  and available for review in the ultrasound unit.  Impression: Technically successful ultrasound guided injection.    Impression and Recommendations:     This case required medical decision making of moderate complexity. The above documentation has been reviewed and is accurate and complete Judi Saa, DO       Note: This dictation was prepared with Dragon dictation along with smaller phrase technology. Any transcriptional errors that result from this process are unintentional.

## 2019-10-27 NOTE — Patient Instructions (Signed)
Exercises 3x a week Ice 20 min at end of day See me in 5-6 weeks (30 min appt)

## 2019-12-01 ENCOUNTER — Ambulatory Visit: Payer: 59 | Admitting: Family Medicine

## 2019-12-06 ENCOUNTER — Ambulatory Visit: Payer: 59 | Admitting: Family Medicine

## 2019-12-06 ENCOUNTER — Ambulatory Visit (INDEPENDENT_AMBULATORY_CARE_PROVIDER_SITE_OTHER): Payer: 59

## 2019-12-06 ENCOUNTER — Encounter: Payer: Self-pay | Admitting: Family Medicine

## 2019-12-06 ENCOUNTER — Other Ambulatory Visit: Payer: Self-pay

## 2019-12-06 VITALS — BP 118/80 | HR 58 | Ht 63.0 in | Wt 233.0 lb

## 2019-12-06 DIAGNOSIS — M67462 Ganglion, left knee: Secondary | ICD-10-CM | POA: Diagnosis not present

## 2019-12-06 DIAGNOSIS — M2550 Pain in unspecified joint: Secondary | ICD-10-CM

## 2019-12-06 DIAGNOSIS — M7122 Synovial cyst of popliteal space [Baker], left knee: Secondary | ICD-10-CM | POA: Diagnosis not present

## 2019-12-06 DIAGNOSIS — M7551 Bursitis of right shoulder: Secondary | ICD-10-CM

## 2019-12-06 LAB — CBC WITH DIFFERENTIAL/PLATELET
Basophils Absolute: 0 10*3/uL (ref 0.0–0.1)
Basophils Relative: 0.5 % (ref 0.0–3.0)
Eosinophils Absolute: 0.1 10*3/uL (ref 0.0–0.7)
Eosinophils Relative: 2 % (ref 0.0–5.0)
HCT: 41.5 % (ref 36.0–46.0)
Hemoglobin: 13.8 g/dL (ref 12.0–15.0)
Lymphocytes Relative: 39.1 % (ref 12.0–46.0)
Lymphs Abs: 2.7 10*3/uL (ref 0.7–4.0)
MCHC: 33.1 g/dL (ref 30.0–36.0)
MCV: 90.9 fl (ref 78.0–100.0)
Monocytes Absolute: 0.7 10*3/uL (ref 0.1–1.0)
Monocytes Relative: 10 % (ref 3.0–12.0)
Neutro Abs: 3.3 10*3/uL (ref 1.4–7.7)
Neutrophils Relative %: 48.4 % (ref 43.0–77.0)
Platelets: 204 10*3/uL (ref 150.0–400.0)
RBC: 4.57 Mil/uL (ref 3.87–5.11)
RDW: 14.2 % (ref 11.5–15.5)
WBC: 6.9 10*3/uL (ref 4.0–10.5)

## 2019-12-06 LAB — C-REACTIVE PROTEIN: CRP: <1 mg/dL (ref 0.5–20.0)

## 2019-12-06 LAB — SEDIMENTATION RATE: Sed Rate: 31 mm/hr — ABNORMAL HIGH (ref 0–30)

## 2019-12-06 MED ORDER — DOXYCYCLINE HYCLATE 100 MG PO TABS: 100.0000 mg | ORAL_TABLET | Freq: Two times a day (BID) | ORAL | 0 refills | Status: DC

## 2019-12-06 NOTE — Assessment & Plan Note (Signed)
Patient very concerned because there was significant amount of white discharge that came from the area.  We will put on doxycycline but I do not believe that it is an infectious etiology at this time that is significant.  Doxycycline given in laboratory work-up.  We will see if anything grows from it.  Discussed icing regimen and home exercises.  I am hoping that this will be beneficial.  Already has had surgical intervention for the cyst with it coming back almost immediately.  We will hope that this makes significant progress.  Follow-up again 4 to 6 weeks

## 2019-12-06 NOTE — Assessment & Plan Note (Signed)
Patient given injection, tolerated the procedure well, discussed icing regimen and home exercise, which activities to do which wants to avoid.  Increase activity slowly.  Follow-up again in 4 to 8 weeks

## 2019-12-06 NOTE — Assessment & Plan Note (Signed)
Aspiration today.  Responded well.  Hopefully this will continue to improve.  Follow-up again 4 to 6 weeks.

## 2019-12-06 NOTE — Patient Instructions (Addendum)
Doxy for next 10 days Labs today See me in 4-6 weeks

## 2019-12-06 NOTE — Progress Notes (Signed)
[place] Phone: [phone] Subjective:   Olivia Barnes, am serving as a scribe for Dr. Hulan Saas. This visit occurred during the SARS-CoV-2 public health emergency.  Safety protocols were in place, including screening questions prior to the visit, additional usage of staff PPE, and extensive cleaning of exam room while observing appropriate contact time as indicated for disinfecting solutions.   I'm seeing this patient by the request  of:  Charleston Poot, MD  CC: Left knee pain follow-up  [id number]   10/27/2019 Attempted aspiration done today.  I do feel that after this we need to try again for multiple angles.  Patient will be scheduled in 1 month for another potential.  Discussed posture and ergonomics, discussed compression, follow-up again in 4 to 8 weeks  Noted again  Update 12/06/2019 Olivia Barnes is a 64 y.o. female coming in with complaint of left knee and right shoulder. Pain increased last week in both joints. Pain is improving slowly. Pain throughout entire shoulder joint with flexion and adduction.  Patient continues to have significant difficulty with raising the shoulder repetitively.  Some pain at night as well.     Past Medical History:  Diagnosis Date  . Hyperlipemia   . Hypertension    Past Surgical History:  Procedure Laterality Date  . 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 Marital status: Married    Spouse name: Not on file  . Number of children: Not on file  . Years of education: Not on file  . Highest education level: Not on file  Occupational History  . Not on file  Tobacco Use  . Smoking status: Unknown If Ever Smoked  . Smokeless tobacco: Never Used  Substance and Sexual Activity  . Alcohol use: Not on file  . Drug use: Not on file  . Sexual activity: Not on file  Other Topics Concern  . Not on  file  Social History Narrative  . Not on file   Social Determinants of Health   Financial Resource Strain:   . Difficulty of Paying Living Expenses:   Food Insecurity:   . Worried About Charity fundraiser in the Last Year:   . Arboriculturist in the Last Year:   Transportation Needs:   . Film/video editor (Medical):   [name] Lack of Transportation (Non-Medical):   Physical Activity:   . Days of Exercise per Week:   . Minutes of Exercise per Session:   Stress:   . Feeling of Stress :   Social Connections:   . Frequency of Communication with Friends and Family:   . Frequency of Social Gatherings with Friends and Family:   . Attends Religious Services:   . Active Member of Clubs or Organizations:   . Attends Archivist Meetings:   [name] Marital Status:    Allergies  Allergen Reactions  . Gabapentin Nausea And Vomiting  . Penicillins Rash   Barnes family history on file.   Current Outpatient Medications (Cardiovascular):  .  hydrochlorothiazide (MICROZIDE) 12.5 MG capsule, Take 12.5 mg by mouth daily.     Current Outpatient Medications (Other):  [name]  Diclofenac Sodium (PENNSAID) 2 % SOLN, Apply 2 Pump topically 2 (two) times daily as needed. .  Diclofenac Sodium 2 % SOLN, Place 2 g onto the skin 2 (two) times daily. [name]  doxycycline (VIBRA-TABS) 100 MG  tablet, Take 1 tablet (100 mg total) by mouth 2 (two) times daily. .  nortriptyline (PAMELOR) 10 MG capsule, TAKE 1 CAPSULE (10 MG TOTAL) BY MOUTH AT BEDTIME. [name]  tiZANidine (ZANAFLEX) 4 MG tablet, TAKE 1 TABLET BY MOUTH EVERYDAY AT BEDTIME .  UNKNOWN TO PATIENT,  .  Vitamin D, Ergocalciferol, (DRISDOL) 1.25 MG (50000 UT) CAPS capsule, Take 1 capsule (50,000 Units total) by mouth every 7 (seven) days.   Reviewed prior external information including notes and imaging from  primary care provider As well as notes that were available from care everywhere and other healthcare systems.  Past medical history, social, surgical  and family history all reviewed in electronic medical record.  Barnes pertanent information unless stated regarding to the chief complaint.   Review of Systems:  Barnes headache, visual changes, nausea, vomiting, diarrhea, constipation, dizziness, abdominal pain, skin rash, fevers, chills, night sweats, weight loss, swollen lymph nodes, body aches, joint swelling, chest pain, shortness of breath, mood changes. POSITIVE muscle aches  Objective  Blood pressure 118/80, pulse (!) 58, height 5\' 3"  (1.6 m), weight 233 lb (105.7 kg), SpO2 98 %.   General: Barnes apparent distress alert and oriented x3 mood and affect normal, dressed appropriately.  HEENT: Pupils equal, extraocular movements intact  Respiratory: Patient's speak in full sentences and does not appear short of breath  Cardiovascular: Barnes lower extremity edema, non tender, Barnes erythema  Neuro: Cranial nerves II through XII are intact, neurovascularly intact in all extremities with 2+ DTRs and 2+ pulses.  Gait antalgic gait MSK:   Shoulder: Right Inspection reveals Barnes abnormalities, atrophy or asymmetry. Palpation is normal with Barnes tenderness over AC joint or bicipital groove. ROM is full in all planes passively. Rotator cuff strength normal throughout. signs of impingement with positive Neer and Hawkin's tests, but negative empty can sign. Speeds and Yergason's tests normal. Barnes labral pathology noted with negative Obrien's, negative clunk and good stability. Normal scapular function observed. Barnes painful arc and Barnes drop arm sign. Barnes apprehension sign Contralateral shoulder unremarkable  Left knee exam shows some arthritic changes.  Still severely tender to palpation of the medial joint line.  Previous incision is noted in this area.  Patient though does have what appears to be a cystic formation still over the medial joint space.  Procedure: Real-time Ultrasound Guided Injection and aspiration of left medial ganglion cyst of the knee Device: GE  Logiq Q7 Ultrasound guided injection is preferred based studies that show increased duration, increased effect, greater accuracy, decreased procedural pain, increased response rate, and decreased cost with ultrasound guided versus blind injection.  Verbal informed consent obtained.  Time-out conducted.  Noted Barnes overlying erythema, induration, or other signs of local infection.  Skin prepped in a sterile fashion.  Local anesthesia: Topical Ethyl chloride.  With sterile technique and under real time ultrasound guidance: With an 18-gauge 1-1/2 inch needle injected with 2 cc of 0.5% Marcaine and then had significant amount of pus withdrawn in this area seem to have some ganglion mixture to it as well.  Injected 1 cc of Kenalog 40 mg/mL.  Patient did have some mild blood loss but did have very healthy looking blood. Completed without difficulty  Pain immediately resolved suggesting accurate placement of the medication.  Advised to call if fevers/chills, erythema, induration, drainage, or persistent bleeding.  Images permanently stored and available for review in the ultrasound unit.  Impression: Technically successful ultrasound guided injection.  MSK performed of: Right This study was  ordered, performed, and interpreted by Terrilee Files D.O.  Shoulder:   Supraspinatus:  Appears normal on long and transverse views, Bursal bulge seen with shoulder abduction on impingement view. Infraspinatus:  Appears normal on long and transverse views. Significant increase in Doppler flow Subscapularis:  Appears normal on long and transverse views. Positive bursa Teres Minor:  Appears normal on long and transverse views. AC joint:  Capsule undistended, Barnes geyser sign. Glenohumeral Joint:  Appears normal without effusion. Glenoid Labrum:  Intact without visualized tears. Biceps Tendon:  Appears normal on long and transverse views, Barnes fraying of tendon, tendon located in intertubercular groove, Barnes subluxation  with shoulder internal or external rotation.  Impression: Subacromial bursitis  Procedure: Real-time Ultrasound Guided Injection of right glenohumeral joint Device: GE Logiq E  Ultrasound guided injection is preferred based studies that show increased duration, increased effect, greater accuracy, decreased procedural pain, increased response rate with ultrasound guided versus blind injection.  Verbal informed consent obtained.  Time-out conducted.  Noted Barnes overlying erythema, induration, or other signs of local infection.  Skin prepped in a sterile fashion.  Local anesthesia: Topical Ethyl chloride.  With sterile technique and under real time ultrasound guidance:  Joint visualized.  23g 1  inch needle inserted posterior approach. Pictures taken for needle placement. Patient did have injection of 2 cc of 1% lidocaine, 2 cc of 0.5% Marcaine, and 1.0 cc of Kenalog 40 mg/dL. Completed without difficulty  Pain immediately resolved suggesting accurate placement of the medication.  Advised to call if fevers/chills, erythema, induration, drainage, or persistent bleeding.  Images permanently stored and available for review in the ultrasound unit.  Impression: Technically successful ultrasound guided injection.    Impression and Recommendations:     This case required medical decision making of moderate complexity. The above documentation has been reviewed and is accurate and complete Judi Saa, DO       Note: This dictation was prepared with Dragon dictation along with smaller phrase technology. Any transcriptional errors that result from this process are unintentional.

## 2019-12-14 ENCOUNTER — Other Ambulatory Visit: Payer: Self-pay | Admitting: Family Medicine

## 2019-12-14 ENCOUNTER — Other Ambulatory Visit: Payer: Self-pay

## 2019-12-14 MED ORDER — PENNSAID 2 % EX SOLN: 2.0000 | Freq: Two times a day (BID) | CUTANEOUS | 1 refills | Status: DC | PRN

## 2019-12-21 LAB — BODY FLUID CULTURE AER/ANAER

## 2019-12-21 LAB — RESULT

## 2020-01-11 ENCOUNTER — Other Ambulatory Visit: Payer: Self-pay

## 2020-01-11 ENCOUNTER — Ambulatory Visit: Payer: 59 | Admitting: Family Medicine

## 2020-01-11 ENCOUNTER — Encounter: Payer: Self-pay | Admitting: Family Medicine

## 2020-01-11 ENCOUNTER — Ambulatory Visit: Payer: Self-pay

## 2020-01-11 VITALS — BP 110/80 | HR 66 | Ht 63.0 in | Wt 237.0 lb

## 2020-01-11 DIAGNOSIS — G8929 Other chronic pain: Secondary | ICD-10-CM

## 2020-01-11 DIAGNOSIS — M25562 Pain in left knee: Secondary | ICD-10-CM | POA: Diagnosis not present

## 2020-01-11 DIAGNOSIS — H209 Unspecified iridocyclitis: Secondary | ICD-10-CM | POA: Insufficient documentation

## 2020-01-11 DIAGNOSIS — M67462 Ganglion, left knee: Secondary | ICD-10-CM | POA: Diagnosis not present

## 2020-01-11 DIAGNOSIS — M7551 Bursitis of right shoulder: Secondary | ICD-10-CM

## 2020-01-11 MED ORDER — PENNSAID 2 % EX SOLN: 2.0000 g | Freq: Two times a day (BID) | CUTANEOUS | 3 refills | Status: DC

## 2020-01-11 NOTE — Assessment & Plan Note (Signed)
Patient did have good success with the aspiration with some mild reaccumulation occurring.  We will hold on doing another one at this time but will follow-up again in 6 to 8 weeks and likely will need this as well as the Baker's cyst aspirated.  Patient will continue with this.  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  Patient is in agreement with the plan.  Follow-up with me again in 4 to 8 weeks

## 2020-01-11 NOTE — Assessment & Plan Note (Signed)
Seeing eye specialist but will refer patient for work-up of rheumatology

## 2020-01-11 NOTE — Assessment & Plan Note (Signed)
100% better at this time.  Continue to watch range of motion.  Follow-up again in 4 to 8 weeks

## 2020-01-11 NOTE — Patient Instructions (Addendum)
Good to see you Knee and shoulder looking good Refilled pennsaid  See me again in 6-8 weeks we will look at the knee again

## 2020-01-11 NOTE — Progress Notes (Signed)
[name] [address] [place] [address] Phone: [phone] Subjective:   I Olivia Barnes am serving as a Neurosurgeon for Dr. Antoine Primas.  This visit occurred during the SARS-CoV-2 public health emergency.  Safety protocols were in place, including screening questions prior to the visit, additional usage of staff PPE, and extensive cleaning of exam room while observing appropriate contact time as indicated for disinfecting solutions.   I'm seeing this patient by the request  of:  Olivia Moron, MD  CC:   [id number]   12/06/2019 Patient given injection, tolerated the procedure well, discussed icing regimen and home exercise, which activities to do which wants to avoid.  Increase activity slowly.  Follow-up again in 4 to 8 weeks  Patient very concerned because there was significant amount of white discharge that came from the area.  We will put on doxycycline but I do not believe that it is an infectious etiology at this time that is significant.  Doxycycline given in laboratory work-up.  We will see if anything grows from it.  Discussed icing regimen and home exercises.  I am hoping that this will be beneficial.  Already has had surgical intervention for the cyst with it coming back almost immediately.  We will hope that this makes significant progress.  Follow-up again 4 to 6 weeks  Update 01/11/2020 Olivia Barnes is a 64 y.o. female coming in with complaint of right shoulder, bursitis and Baker's cyst of left knee. Patient states her shoulder is doing well. Knee is not doing too well. Knee is sore.  Patient states no increase in instability at the moment.  Patient states that after the aspiration of the cyst was feeling significantly better for multiple weeks and now worsening.  Has had other health issues.  Reviewed patient's chart in great entirety today.  Patient has had uveitis as well as glaucoma and cataracts.  Having difficulty with all this as  well.  Elevated sedimentation rate again as well.  Patient has been put on mycophenolate but has not noticed any true improvement yet.       Past Medical History:  Diagnosis Date  . Hyperlipemia   . Hypertension    Past Surgical History:  Procedure Laterality Date  . 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 Marital status: Married    Spouse name: Not on file  . Number of children: Not on file  . Years of education: Not on file  . Highest education level: Not on file  Occupational History  . Not on file  Tobacco Use  . Smoking status: Unknown If Ever Smoked  . Smokeless tobacco: Never Used  Substance and Sexual Activity  . Alcohol use: Not on file  . Drug use: Not on file  . Sexual activity: Not on file  Other Topics Concern  . Not on file  Social History Narrative  . Not on file   Social Determinants of Health   Financial Resource Strain:   . Difficulty of Paying Living Expenses:   Food Insecurity:   . Worried About Programme researcher, broadcasting/film/video in the Last Year:   . Barista in the Last Year:   Transportation Needs:   . Freight forwarder (Medical):   [name] Lack of Transportation (Non-Medical):   Physical Activity:   . Days of Exercise per Week:   . Minutes of Exercise per Session:   Stress:   .  Feeling of Stress :   Social Connections:   . Frequency of Communication with Friends and Family:   . Frequency of Social Gatherings with Friends and Family:   . Attends Religious Services:   . Active Member of Clubs or Organizations:   . Attends Archivist Meetings:   [name] Marital Status:    Allergies  Allergen Reactions  . Gabapentin Nausea And Vomiting  . Penicillins Rash   No family history on file.   Current Outpatient Medications (Cardiovascular):  .  hydrochlorothiazide (MICROZIDE) 12.5 MG capsule, Take 12.5 mg by mouth daily.     Current Outpatient Medications (Other):  [name]   Diclofenac Sodium (PENNSAID) 2 % SOLN, Apply 2 Pump topically 2 (two) times daily as needed. .  Diclofenac Sodium 2 % SOLN, Place 2 g onto the skin 2 (two) times daily. [name]  doxycycline (VIBRA-TABS) 100 MG tablet, Take 1 tablet (100 mg total) by mouth 2 (two) times daily. .  nortriptyline (PAMELOR) 10 MG capsule, TAKE 1 CAPSULE (10 MG TOTAL) BY MOUTH AT BEDTIME. [name]  tiZANidine (ZANAFLEX) 4 MG tablet, TAKE 1 TABLET BY MOUTH EVERYDAY AT BEDTIME .  UNKNOWN TO PATIENT,  .  Vitamin D, Ergocalciferol, (DRISDOL) 1.25 MG (50000 UT) CAPS capsule, Take 1 capsule (50,000 Units total) by mouth every 7 (seven) days. .  Diclofenac Sodium (PENNSAID) 2 % SOLN, Place 2 g onto the skin 2 (two) times daily.   Reviewed prior external information including notes and imaging from  primary care provider As well as notes that were available from care everywhere and other healthcare systems.  Past medical history, social, surgical and family history all reviewed in electronic medical record.  No pertanent information unless stated regarding to the chief complaint.   Review of Systems:  No headache, visual changes, nausea, vomiting, diarrhea, constipation, dizziness, abdominal pain, skin rash, fevers, chills, night sweats, weight loss, swollen lymph nodes, body aches, joint swelling, chest pain, shortness of breath, mood changes. POSITIVE muscle aches  Objective  Blood pressure 110/80, pulse 66, height 5\' 3"  (1.6 m), weight 237 lb (107.5 kg), SpO2 97 %.   General: No apparent distress alert and oriented x3 mood and affect normal, dressed appropriately.  Patient's eyes do have some inflammation noted Respiratory: Patient's speak in full sentences and does not appear short of breath  Cardiovascular: No lower extremity edema, non tender, no erythema  Neuro: Cranial nerves II through XII are intact, neurovascularly intact in all extremities with 2+ DTRs and 2+ pulses.  Gait antalgic Left knee still has arthritic changes.   Patient does have some very mild instability.  Minimal tenderness over the medial joint line which is an improvement from previous exam.  Right shoulder exam full range of motion.  Negative impingement noted.  Limited musculoskeletal ultrasound was performed and interpreted by Lyndal Pulley  Limited ultrasound shows the patient does have some recurrence of the cyst formation as well as the Baker's cyst.  Approximately 50% the size of what both of them were previously.   Impression and Recommendations:     This case required medical decision making of moderate complexity. The above documentation has been reviewed and is accurate and complete Lyndal Pulley, DO       Note: This dictation was prepared with Dragon dictation along with smaller phrase technology. Any transcriptional errors that result from this process are unintentional.

## 2020-02-22 ENCOUNTER — Ambulatory Visit: Payer: 59 | Admitting: Family Medicine

## 2020-02-22 ENCOUNTER — Ambulatory Visit: Payer: Self-pay

## 2020-02-22 ENCOUNTER — Other Ambulatory Visit: Payer: Self-pay

## 2020-02-22 ENCOUNTER — Encounter: Payer: Self-pay | Admitting: Family Medicine

## 2020-02-22 VITALS — BP 112/74 | HR 69 | Ht 63.0 in | Wt 234.0 lb

## 2020-02-22 DIAGNOSIS — G8928 Other chronic postprocedural pain: Secondary | ICD-10-CM

## 2020-02-22 DIAGNOSIS — M25562 Pain in left knee: Secondary | ICD-10-CM

## 2020-02-22 DIAGNOSIS — M25559 Pain in unspecified hip: Secondary | ICD-10-CM | POA: Diagnosis not present

## 2020-02-22 DIAGNOSIS — M2550 Pain in unspecified joint: Secondary | ICD-10-CM | POA: Diagnosis not present

## 2020-02-22 DIAGNOSIS — M7122 Synovial cyst of popliteal space [Baker], left knee: Secondary | ICD-10-CM | POA: Diagnosis not present

## 2020-02-22 DIAGNOSIS — G8929 Other chronic pain: Secondary | ICD-10-CM | POA: Diagnosis not present

## 2020-02-22 DIAGNOSIS — H209 Unspecified iridocyclitis: Secondary | ICD-10-CM

## 2020-02-22 DIAGNOSIS — M25552 Pain in left hip: Secondary | ICD-10-CM

## 2020-02-22 DIAGNOSIS — Z96643 Presence of artificial hip joint, bilateral: Secondary | ICD-10-CM

## 2020-02-22 LAB — IBC PANEL
Iron: 79 ug/dL (ref 42–145)
Saturation Ratios: 21.3 % (ref 20.0–50.0)
Transferrin: 265 mg/dL (ref 212.0–360.0)

## 2020-02-22 LAB — SEDIMENTATION RATE: Sed Rate: 27 mm/hr (ref 0–30)

## 2020-02-22 LAB — URIC ACID: Uric Acid, Serum: 5.3 mg/dL (ref 2.4–7.0)

## 2020-02-22 LAB — VITAMIN D 25 HYDROXY (VIT D DEFICIENCY, FRACTURES): VITD: 29.51 ng/mL — ABNORMAL LOW (ref 30.00–100.00)

## 2020-02-22 LAB — C-REACTIVE PROTEIN: CRP: <1 mg/dL (ref 0.5–20.0)

## 2020-02-22 LAB — FERRITIN: Ferritin: 70.4 ng/mL (ref 10.0–291.0)

## 2020-02-22 MED ORDER — NABUMETONE 750 MG PO TABS
750.0000 mg | ORAL_TABLET | Freq: Every day | ORAL | 3 refills | Status: DC
Start: 2020-02-22 — End: 2020-05-09

## 2020-02-22 MED ORDER — MONTELUKAST SODIUM 10 MG PO TABS
10.0000 mg | ORAL_TABLET | Freq: Every day | ORAL | 0 refills | Status: DC
Start: 2020-02-22 — End: 2020-03-15

## 2020-02-22 NOTE — Assessment & Plan Note (Signed)
Stable in appearance.  Patient does have some mild reaccumulation.  Relafen given today.  Continue to monitor.

## 2020-02-22 NOTE — Assessment & Plan Note (Signed)
With history of uveitis [id number] ordered today

## 2020-02-22 NOTE — Progress Notes (Signed)
[name] [address] [place] [address] Phone: [phone] Subjective:   [name], am serving as a scribe for Dr. Antoine Primas. This visit occurred during the SARS-CoV-2 public health emergency.  Safety protocols were in place, including screening questions prior to the visit, additional usage of staff PPE, and extensive cleaning of exam room while observing appropriate contact time as indicated for disinfecting solutions.   I'm seeing this patient by the request  of:  [name], MD  CC: Knee pain follow-up, bilateral hip pain  [id number]   01/11/2020 Patient did have good success with the aspiration with some mild reaccumulation occurring.  We will hold on doing another one at this time but will follow-up again in 6 to 8 weeks and likely will need this as well as the Baker's cyst aspirated.  Patient will continue with this.  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  Patient is in agreement with the plan.  Follow-up with me again in 4 to 8 weeks  Update 02/22/2020 Olivia Barnes is a 64 y.o. female coming in with complaint of left knee pain. Patient states that left knee pain is over medial aspect. Pain is intermittent.   Left hip pain in groin and right hip pain in glute since last visit. Walking and bending increases pain. Using Aleve for pain.  Patient states when she takes Aleve does do relatively well.  No significant stomach discomfort  Also having left hand pain over 2nd metacarpal. Pain radiates into the fingers.    Patient has gone to rheumatology but work-up has been fairly unremarkable she states.  Past Medical History:  Diagnosis Date  . Hyperlipemia   . Hypertension    Past Surgical History:  Procedure Laterality Date  . 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 Marital status: Married     Spouse name: Not on file  . Number of children: Not on file  . Years of education: Not on file  . Highest education level: Not on file  Occupational History  . Not on file  Tobacco Use  . Smoking status: Unknown If Ever Smoked  . Smokeless tobacco: Never Used  Substance and Sexual Activity  . Alcohol use: Not on file  . Drug use: Not on file  . Sexual activity: Not on file  Other Topics Concern  . Not on file  Social History Narrative  . Not on file   Social Determinants of Health   Financial Resource Strain:   . Difficulty of Paying Living Expenses:   Food Insecurity:   . Worried About Programme researcher, broadcasting/film/video in the Last Year:   . Barista in the Last Year:   Transportation Needs:   . Freight forwarder (Medical):   [name] Lack of Transportation (Non-Medical):   Physical Activity:   . Days of Exercise per Week:   . Minutes of Exercise per Session:   Stress:   . Feeling of Stress :   Social Connections:   . Frequency of Communication with Friends and Family:   . Frequency of Social Gatherings with Friends and Family:   . Attends Religious Services:   . Active Member of Clubs or Organizations:   . Attends Banker Meetings:   [name] Marital Status:    Allergies  Allergen Reactions  .  Gabapentin Nausea And Vomiting  . Penicillins Rash   History reviewed. No pertinent family history.   Current Outpatient Medications (Cardiovascular):  .  hydrochlorothiazide (MICROZIDE) 12.5 MG capsule, Take 12.5 mg by mouth daily.  Current Outpatient Medications (Respiratory):  .  montelukast (SINGULAIR) 10 MG tablet, Take 1 tablet (10 mg total) by mouth at bedtime.  Current Outpatient Medications (Analgesics):  .  nabumetone (RELAFEN) 750 MG tablet, Take 1 tablet (750 mg total) by mouth daily.   Current Outpatient Medications (Other):  [name]  Diclofenac Sodium (PENNSAID) 2 % SOLN, Apply 2 Pump topically 2 (two) times daily as needed. .  Diclofenac Sodium (PENNSAID) 2 %  SOLN, Place 2 g onto the skin 2 (two) times daily. .  Diclofenac Sodium 2 % SOLN, Place 2 g onto the skin 2 (two) times daily. [name]  doxycycline (VIBRA-TABS) 100 MG tablet, Take 1 tablet (100 mg total) by mouth 2 (two) times daily. .  nortriptyline (PAMELOR) 10 MG capsule, TAKE 1 CAPSULE (10 MG TOTAL) BY MOUTH AT BEDTIME. [name]  tiZANidine (ZANAFLEX) 4 MG tablet, TAKE 1 TABLET BY MOUTH EVERYDAY AT BEDTIME .  UNKNOWN TO PATIENT,  .  Vitamin D, Ergocalciferol, (DRISDOL) 1.25 MG (50000 UT) CAPS capsule, Take 1 capsule (50,000 Units total) by mouth every 7 (seven) days.   Reviewed prior external information including notes and imaging from  primary care provider As well as notes that were available from care everywhere and other healthcare systems.  Past medical history, social, surgical and family history all reviewed in electronic medical record.  No pertanent information unless stated regarding to the chief complaint.   Review of Systems:  No headache, visual changes, nausea, vomiting, diarrhea, constipation, dizziness, abdominal pain, skin rash, fevers, chills, night sweats, weight loss, swollen lymph nodes, , chest pain, shortness of breath, mood changes. POSITIVE muscle aches, body aches, joint swelling  Objective  Blood pressure 112/74, pulse 69, height 5\' 3"  (1.6 m), weight 234 lb (106.1 kg), SpO2 97 %.   General: No apparent distress alert and oriented x3 mood and affect normal, dressed appropriately.  HEENT: Pupils equal, extraocular movements intact  Respiratory: Patient's speak in full sentences and does not appear short of breath  Cardiovascular: Trace lower extremity edema, non tender, no erythema  Neuro: Cranial nerves II through XII are intact, neurovascularly intact in all extremities with 2+ DTRs and 2+ pulses.  Gait s bilateral hips are diffusely tender to palpation posteriorly as well as on the side.  Patient's left hip has increasing discomfort with internal range of motion but no  significant clunking instability noted.  Left knee does have some mild increase in the swelling in the popliteal area still continued tenderness on the medial aspect of the knee but still stable from patient's previous exam  Limited musculoskeletal ultrasound was performed and interpreted by  Limited ultrasound of patient's left knee shows that the ganglion cyst is still there but not significant increase in Doppler flow surrounding the area.  Baker's cyst is starting to reaccumulate but not severe.    Impression and Recommendations:     The above documentation has been reviewed and is accurate and complete Judi Saa, DO       Note: This dictation was prepared with Dragon dictation along with smaller phrase technology. Any transcriptional errors that result from this process are unintentional.

## 2020-02-22 NOTE — Assessment & Plan Note (Signed)
Continued chronic pain from bilateral hips.  Chromium and cobalt level.  May need to consider the possibility of bone x-rays in the future.  Started on Singulair to see if this would be potentially helpful as well.  Discussed icing regimen and home exercise, which activities to do which wants to avoid.  Increase activity slowly.  Follow-up again in 4 to 8 weeks

## 2020-02-22 NOTE — Patient Instructions (Addendum)
Labs today See me in 6 weeks 

## 2020-02-24 LAB — HLA-B27 ANTIGEN: HLA-B27 Antigen: NEGATIVE

## 2020-02-29 LAB — CHROMIUM AND COBALT, WB (MOM)
Chromium: 1.2 ng/mL (ref <3.0)
Cobalt: 2.2 ng/mL (ref <3.0)

## 2020-03-15 ENCOUNTER — Other Ambulatory Visit: Payer: Self-pay | Admitting: Family Medicine

## 2020-03-30 ENCOUNTER — Other Ambulatory Visit: Payer: Self-pay | Admitting: Family Medicine

## 2020-04-04 ENCOUNTER — Ambulatory Visit: Payer: Self-pay

## 2020-04-04 ENCOUNTER — Encounter: Payer: Self-pay | Admitting: Family Medicine

## 2020-04-04 ENCOUNTER — Other Ambulatory Visit: Payer: Self-pay

## 2020-04-04 ENCOUNTER — Ambulatory Visit: Payer: 59 | Admitting: Family Medicine

## 2020-04-04 VITALS — BP 116/74 | HR 62 | Ht 63.0 in | Wt 236.0 lb

## 2020-04-04 DIAGNOSIS — M25562 Pain in left knee: Secondary | ICD-10-CM | POA: Diagnosis not present

## 2020-04-04 DIAGNOSIS — M67462 Ganglion, left knee: Secondary | ICD-10-CM

## 2020-04-04 DIAGNOSIS — M7122 Synovial cyst of popliteal space [Baker], left knee: Secondary | ICD-10-CM

## 2020-04-04 DIAGNOSIS — G8929 Other chronic pain: Secondary | ICD-10-CM | POA: Diagnosis not present

## 2020-04-04 NOTE — Progress Notes (Signed)
[facility] [place] Phone: [phone] Subjective:   Olivia Barnes, am serving as a scribe for Dr. Hulan Saas. This visit occurred during the SARS-CoV-2 public health emergency.  Safety protocols were in place, including screening questions prior to the visit, additional usage of staff PPE, and extensive cleaning of exam room while observing appropriate contact time as indicated for disinfecting solutions.   I'm seeing this patient by the request  of:  Charleston Poot, MD  CC: Left knee pain follow-up  JSH:[id number]   02/22/2020 Continued chronic pain from bilateral hips.  Chromium and cobalt level.  May need to consider the possibility of bone x-rays in the future.  Started on Singulair to see if this would be potentially helpful as well.  Discussed icing regimen and home exercise, which activities to do which wants to avoid.  Increase activity slowly.  Follow-up again in 4 to 8 weeks  Stable in appearance.  Patient does have some mild reaccumulation.  Relafen given today.  Continue to monitor.  With history of uveitis HLA-B27 ordered today  Update 04/04/2020 Olivia Barnes is a 64 y.o. female coming in with complaint of left medial knee pain. Pain is sharp. Was unable to walk the other morning due to pain.  Patient has known ganglion cyst as well as a radial tear of the medial meniscus.  Patient did have surgery but Barnes significant improvement.  We have aspirated the cyst 4 months ago but feels like it is coming back again.  Affecting daily activities.    Past Medical History:  Diagnosis Date  . Hyperlipemia   . Hypertension    Past Surgical History:  Procedure Laterality Date  . 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 Marital status: Married    Spouse name: Not on file  . Number of children: Not on file  . Years of education: Not on file  . Highest  education level: Not on file  Occupational History  . Not on file  Tobacco Use  . Smoking status: Unknown If Ever Smoked  . Smokeless tobacco: Never Used  Substance and Sexual Activity  . Alcohol use: Not on file  . Drug use: Not on file  . Sexual activity: Not on file  Other Topics Concern  . Not on file  Social History Narrative  . Not on file   Social Determinants of Health   Financial Resource Strain:   . Difficulty of Paying Living Expenses:   Food Insecurity:   . Worried About Charity fundraiser in the Last Year:   . Arboriculturist in the Last Year:   Transportation Needs:   . Film/video editor (Medical):   [name] Lack of Transportation (Non-Medical):   Physical Activity:   . Days of Exercise per Week:   . Minutes of Exercise per Session:   Stress:   . Feeling of Stress :   Social Connections:   . Frequency of Communication with Friends and Family:   . Frequency of Social Gatherings with Friends and Family:   . Attends Religious Services:   . Active Member of Clubs or Organizations:   . Attends Archivist Meetings:   [name] Marital Status:    Allergies  Allergen Reactions  . Gabapentin Nausea And Vomiting  . Penicillins Rash   Barnes family history on file.   Current Outpatient Medications (Cardiovascular):  .  hydrochlorothiazide (MICROZIDE) 12.5 MG capsule, Take 12.5 mg by mouth daily.  Current Outpatient Medications (Respiratory):  .  montelukast (SINGULAIR) 10 MG tablet, TAKE 1 TABLET BY MOUTH EVERYDAY AT BEDTIME  Current Outpatient Medications (Analgesics):  [name]  Adalimumab (HUMIRA) 40 MG/0.4ML PSKT, Inject 40 mg into the skin. .  nabumetone (RELAFEN) 750 MG tablet, Take 1 tablet (750 mg total) by mouth daily.   Current Outpatient Medications (Other):  [name]  Diclofenac Sodium (PENNSAID) 2 % SOLN, Apply 2 Pump topically 2 (two) times daily as needed. .  Diclofenac Sodium (PENNSAID) 2 % SOLN, Place 2 g onto the skin 2 (two) times daily. .  Diclofenac  Sodium 2 % SOLN, Place 2 g onto the skin 2 (two) times daily. [name]  doxycycline (VIBRA-TABS) 100 MG tablet, Take 1 tablet (100 mg total) by mouth 2 (two) times daily. .  mycophenolate (CELLCEPT) 500 MG tablet, Take by mouth 2 (two) times daily. .  nortriptyline (PAMELOR) 10 MG capsule, TAKE 1 CAPSULE (10 MG TOTAL) BY MOUTH AT BEDTIME. [name]  tiZANidine (ZANAFLEX) 4 MG tablet, TAKE 1 TABLET BY MOUTH EVERYDAY AT BEDTIME .  UNKNOWN TO PATIENT,  .  Vitamin D, Ergocalciferol, (DRISDOL) 1.25 MG (50000 UT) CAPS capsule, Take 1 capsule (50,000 Units total) by mouth every 7 (seven) days.   Reviewed prior external information including notes and imaging from  primary care provider As well as notes that were available from care everywhere and other healthcare systems.  Past medical history, social, surgical and family history all reviewed in electronic medical record.  Barnes pertanent information unless stated regarding to the chief complaint.   Review of Systems:  Barnes headache, visual changes, nausea, vomiting, diarrhea, constipation, dizziness, abdominal pain, skin rash, fevers, chills, night sweats, weight loss, swollen lymph nodes, body aches, joint swelling, chest pain, shortness of breath, mood changes. POSITIVE muscle aches  Objective  Blood pressure 116/74, pulse 62, height 5' 3"  (1.6 m), weight 236 lb (107 kg), SpO2 99 %.   General: Barnes apparent distress alert and oriented x3 mood and affect normal, dressed appropriately.  HEENT: Pupils equal, extraocular movements intact  MSK: Severely antalgic Left knee exam shows the patient does still have tenderness to palpation over the medial joint space.  Patient does have a cyst felt.  Barnes reaccumulation of the Baker's cyst at the moment.  Procedure: Real-time Ultrasound Guided Injection of left ganglion cyst of the knee Device: GE Logiq Q7 Ultrasound guided injection is preferred based studies that show increased duration, increased effect, greater accuracy,  decreased procedural pain, increased response rate, and decreased cost with ultrasound guided versus blind injection.  Verbal informed consent obtained.  Time-out conducted.  Noted Barnes overlying erythema, induration, or other signs of local infection.  Skin prepped in a sterile fashion.  Local anesthesia: Topical Ethyl chloride.  With sterile technique and under real time ultrasound guidance: With a 18-gauge 1-1/2 inch needle injected with 0.5 cc of 0.5% Marcaine and aspirated gel-like fluid with a serosanguineous fluid of 10 cc.  Injected 0.5 cc of 40 mg/mL of Kenalog.  Barnes blood loss.  Postinjection instructions given Completed without difficulty  Pain immediately resolved suggesting accurate placement of the medication.  Advised to call if fevers/chills, erythema, induration, drainage, or persistent bleeding.  Images permanently stored and available for review in the ultrasound unit.  Impression: Technically successful ultrasound guided injection.   Impression and Recommendations:     The above documentation has been reviewed and is accurate and complete Olivia Barnes  Olivia Julian, DO       Note: This dictation was prepared with Dragon dictation along with smaller phrase technology. Any transcriptional errors that result from this process are unintentional.

## 2020-04-04 NOTE — Assessment & Plan Note (Signed)
Repeat aspiration done today.  Tolerated the procedure well.  I do believe though that patient is not making significant improvement and does have a Baker's cyst as well.  Patient did have attempted surgical intervention but has made no significant benefit.  Has had to have a recurrent aspiration now in 19-month intervals.  I would like patient to follow-up with another orthopedic surgeon to discuss the potential for any type of surgical intervention.  I believe that patient still likely has time to avoid the knee replacement but we will see where response.  Follow-up with me otherwise in 6 to 8 weeks

## 2020-04-04 NOTE — Patient Instructions (Signed)
Drained cyst Referral to Dr. Rayburn Ma See me again in 2 months

## 2020-04-09 ENCOUNTER — Other Ambulatory Visit: Payer: Self-pay

## 2020-04-09 MED ORDER — DICLOFENAC SODIUM 2 % EX SOLN
2.0000 g | Freq: Two times a day (BID) | CUTANEOUS | 3 refills | Status: DC
Start: 2020-04-09 — End: 2020-05-09

## 2020-04-19 ENCOUNTER — Telehealth: Payer: Self-pay

## 2020-04-19 NOTE — Telephone Encounter (Signed)
PA for the Pennsaid was denied by patients insurance I am going to try and do an appeal.

## 2020-04-20 ENCOUNTER — Encounter: Payer: Self-pay | Admitting: Family Medicine

## 2020-04-24 ENCOUNTER — Ambulatory Visit: Payer: 59 | Admitting: Orthopaedic Surgery

## 2020-04-24 ENCOUNTER — Other Ambulatory Visit: Payer: Self-pay

## 2020-04-24 ENCOUNTER — Encounter: Payer: Self-pay | Admitting: Orthopaedic Surgery

## 2020-04-24 DIAGNOSIS — M67462 Ganglion, left knee: Secondary | ICD-10-CM

## 2020-04-24 NOTE — Telephone Encounter (Signed)
I have received the approval for the Pennsaid it is approved from 04/23/2020-04/23/2021

## 2020-04-24 NOTE — Telephone Encounter (Signed)
I will fax the approval to where you sent it last and they should fill from 04/09/20 script.

## 2020-04-24 NOTE — Progress Notes (Signed)
Office Visit Note   Patient: Olivia Barnes           Date of Birth: [birth year]           MRN: [id number] Visit Date: 04/24/2020              Requested by: Judi Saa, DO [address] [place],  [place] [address] PCP: [name], MD   Assessment & Plan: Visit Diagnoses:  1. Ganglion of left knee     Plan: Given the patient's continued left knee pain and recurrent effusions, a MRI is absolutely warranted at this point of the left knee to better assess what may still be remaining of the cyst and help determine what the next best intervention may be.  We also need to assess the cartilage in her knee as well.  She agrees to this.  We will work on getting her set up for an MRI and I will see her back in follow-up after the MRI which I do feel is medically necessary at this point given the recurrence of her cyst and multiple aspiration showing gelatinous material.  When I do see her in 2 weeks I would like a standing AP and lateral of her left knee as well and her weight and BMI calculation.  Follow-Up Instructions: Return in about 2 weeks (around 05/08/2020).   Orders:  No orders of the defined types were placed in this encounter.  No orders of the defined types were placed in this encounter.     Procedures: No procedures performed   Clinical Data: No additional findings.   Subjective: Chief Complaint  Patient presents with  . 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  She has somewhat of a complicated history in terms of the fact that she had an MRI of her left knee in August of last year.  That showed a large ganglion cyst on the medial aspect of her knee as well as a small incomplete tear of the lateral meniscus.  The cartilage of her knee was otherwise intact in the medial lateral compartments.  She was seen by Dr. Luiz Blare with [facility] who performed an open excision of the  cyst and an arthroscopy of the knee.  Since then, the patient has had a recurrence of her cyst and has had several aspirations by Dr. Antoine Primas.  He has done this under ultrasound and is able to review the notes and he is getting gelatinous material out of this.  I asked her why she had not followed up with Dr. Luiz Blare and she stated that it is hard to get in for the proper appointment time at the same time of her job allowing her to have times where she get to his office.  She stated that Dr. Katrinka Blazing felt was best to have him see her for another opinion.  She does report significant left knee pain on the medial aspect of her knee.  And again, according to the note she has had multiple aspirations under ultrasound since that surgery which she remembers was around October of just this past year.  HPI  Review of Systems She currently denies any headache, chest pain, shortness of breath, fever, chills, nausea, vomiting  Objective: Vital Signs: There were no vitals taken for this visit.  Physical Exam She is alert and orient x3 and in no acute distress Ortho Exam Examination of her left knee  today shows a well-healed medial incision as well as arthroscopy portal sites.  There is no knee joint effusion today.  I cannot palpate any cyst today but she has significant painful along the medial aspect of her knee.  There is no instability on exam. Specialty Comments:  No specialty comments available.  Imaging: No results found. Review of the MRI from last year prior to surgery showed a large cystic structure in the medial aspect of her knee.  There is a small Baker's cyst and a small incomplete lateral meniscal tear.  The cartilage in the knee was intact.  PMFS History: Patient Active Problem List   Diagnosis Date Noted  . Uveitis 01/11/2020  . Acute bursitis of right shoulder 12/06/2019  . Baker's cyst of knee, left 09/30/2019  . Ganglion of left knee 03/11/2019  . Tear of left hamstring  03/01/2019  . 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 Lumbar radiculopathy 03/20/2016  . Arthritis of right hip 10/20/2014  . Septic arthritis (HCC) 07/13/2014  . Transient synovitis of wrist 07/13/2014  . CN (constipation) 07/13/2014  . Essential hypertension 07/13/2014  . Primary osteoarthritis of right hip 07/13/2014   Past Medical History:  Diagnosis Date  . Hyperlipemia   . Hypertension     History reviewed. No pertinent family history.  Past Surgical History:  Procedure Laterality Date  . 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 Not on file  Tobacco Use  . Smoking status: Unknown If Ever Smoked  . Smokeless tobacco: Never Used  Substance and Sexual Activity  . Alcohol use: Not on file  . Drug use: Not on file  . Sexual activity: Not on file

## 2020-04-25 ENCOUNTER — Other Ambulatory Visit: Payer: Self-pay

## 2020-04-25 DIAGNOSIS — M67462 Ganglion, left knee: Secondary | ICD-10-CM

## 2020-04-27 NOTE — Progress Notes (Signed)
Office Visit Note  Patient: Olivia Barnes             Date of Birth: 1955/09/17           MRN: [id number]             PCP: Charleston Poot, MD Referring: Lyndal Pulley, DO Visit Date: 05/09/2020 Occupation: @GUAROCC @  Subjective:  Pain in multiple joints.   History of Present Illness: Ellisha Bankson is a 64 y.o. female seen in consultation per request of Dr. Hulan Saas.  According the patient her symptoms started about 10 years ago with arthritis in her joints.  She states she had bilateral total hip replacements in [place].  She continues to have ongoing pain and discomfort in her hip joints despite having surgery.  She is also had recurrent discomfort in her left knee joint.  She had Baker's cyst removed but even since then she has had several aspirations of her Baker's cyst.  She states recently she developed pain and swelling in her left wrist joint which gradually improved.  She is also had right shoulder joint pain for which she had a cortisone injection by Dr. Tamala Julian.  She states Dr. Tamala Julian has tried several anti-inflammatories and topical NSAIDs over time but she had ongoing pain.  She states last year she underwent cataract surgery and after that she developed increased intraocular pressure and inflammation.  She states that she has poor vision and ongoing inflammation she was referred to Dr. Manuella Ghazi who did extensive work-up and diagnosed her with panuveitis and glaucoma.  He started her on Humira in August 2021 and also added CellCept.  The CellCept dose was gradually increased to 1500 mg twice a day.  She states she has not noticed much improvement in her vision so far.  She has noticed some improvement in the left wrist swelling.  She still has discomfort in her both hips and her left knee joint.  None of the other joints are painful now.  There is no family history of autoimmune disease.  He denies any history of psoriasis or rash.  Activities of Daily Living:  Patient  reports morning stiffness for 15  minutes.   Patient Reports nocturnal pain.  Difficulty dressing/grooming: Denies Difficulty climbing stairs: Denies Difficulty getting out of chair: Denies Difficulty using hands for taps, buttons, cutlery, and/or writing: Denies  Review of Systems  Constitutional: Negative for fatigue, night sweats, weight gain and weight loss.  HENT: Negative for mouth sores, trouble swallowing, trouble swallowing, mouth dryness and nose dryness.   Eyes: Positive for visual disturbance. Negative for pain, redness, itching and dryness.  Respiratory: Negative for cough, shortness of breath and difficulty breathing.   Cardiovascular: Negative for chest pain, palpitations, hypertension, irregular heartbeat and swelling in legs/feet.  Gastrointestinal: Positive for constipation. Negative for blood in stool and diarrhea.  Endocrine: Negative for increased urination.  Genitourinary: Negative for difficulty urinating and vaginal dryness.  Musculoskeletal: Positive for arthralgias, joint pain and morning stiffness. Negative for joint swelling, myalgias, muscle weakness, muscle tenderness and myalgias.  Skin: Negative for color change, rash, hair loss, redness, skin tightness, ulcers and sensitivity to sunlight.  Allergic/Immunologic: Negative for susceptible to infections.  Neurological: Negative for dizziness, numbness, headaches, memory loss, night sweats and weakness.  Hematological: Negative for bruising/bleeding tendency and swollen glands.  Psychiatric/Behavioral: Positive for sleep disturbance. Negative for depressed mood and confusion. The patient is not nervous/anxious.     PMFS History:  Patient Active Problem List   Diagnosis  Date Noted  . Uveitis 01/11/2020  . Acute bursitis of right shoulder 12/06/2019  . Baker's cyst of knee, left 09/30/2019  . Ganglion of left knee 03/11/2019  . Tear of left hamstring 03/01/2019  . 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 Lumbar radiculopathy 03/20/2016  . Arthritis of right hip 10/20/2014  . Septic arthritis ([place]) 07/13/2014  . Transient synovitis of wrist 07/13/2014  . CN (constipation) 07/13/2014  . Essential hypertension 07/13/2014  . Primary osteoarthritis of right hip 07/13/2014    Past Medical History:  Diagnosis Date  . Hyperlipemia   . Hypertension     Family History  Problem Relation Age of Onset  . Heart disease Mother   . Healthy Brother    Past Surgical History:  Procedure Laterality Date  . arm surgery Left    plate in left arm  . CYST EXCISION Left    knee   . 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 Not on file   Immunization History  Administered Date(s) Administered  . PFIZER SARS-COV-2 Vaccination 09/15/2019, 10/04/2019     Objective: Vital Signs: BP 121/67 (BP Location: Right Arm, Patient Position: Sitting, Cuff Size: Large)   Pulse (!) 57   Resp 16   Ht 5' 3"  (1.6 m)   Wt 233 lb (105.7 kg)   BMI 41.27 kg/m    Physical Exam Vitals and nursing note reviewed.  Constitutional:      Appearance: She is well-developed.  HENT:     Head: Normocephalic and atraumatic.  Eyes:     Conjunctiva/sclera: Conjunctivae normal.     Comments: Bilateral conjunctival injection was noted.  Cardiovascular:     Rate and Rhythm: Normal rate and regular rhythm.     Heart sounds: Normal heart sounds.  Pulmonary:     Effort: Pulmonary effort is normal.     Breath sounds: Normal breath sounds.  Abdominal:     General: Bowel sounds are normal.     Palpations: Abdomen is soft.  Musculoskeletal:     Cervical back: Normal range of motion.  Lymphadenopathy:     Cervical: No cervical adenopathy.  Skin:    General: Skin is warm and dry.     Capillary Refill: Capillary refill takes less than 2 seconds.  Neurological:     Mental Status: She is alert and oriented to person,  place, and time.  Psychiatric:        Behavior: Behavior normal.      Musculoskeletal Exam: C-spine was in good range of motion.  Shoulder joints, elbow joints, wrist joints, MCPs and PIPs with good range of motion with no synovitis.  She had painful range of motion of bilateral hip joints which are replaced.  She has warmth on palpation of her left knee joint.  Right knee joint was in good range of motion.  Ankle joints, MTPs and PIPs with good range of motion with no synovitis.  CDAI Exam: CDAI Score: -- Patient Global: --; Provider Global: -- Swollen: --; Tender: -- Joint Exam 05/09/2020   No joint exam has been documented for this visit   There is currently no information documented on the homunculus. Go to the Rheumatology activity and complete the homunculus joint exam.  Investigation: No additional findings.  Imaging: No results found.  Recent Labs: Lab Results  Component Value Date   WBC 6.9 12/06/2019   HGB  13.8 12/06/2019   PLT 204.0 12/06/2019   NA 145 07/06/2010   K 4.4 07/06/2010   CL 109 07/06/2010   CO2 27 07/06/2010   GLUCOSE 93 07/06/2010   BUN 18 07/06/2010   CREATININE 0.8 07/06/2010   CALCIUM 9.6 07/06/2010   GFRAA  07/06/2010    >60        The eGFR has been calculated using the MDRD equation. This calculation has not been validated in all clinical situations. eGFR's persistently <60 mL/min signify possible Chronic Kidney Disease.   April 2021 labs done by Dr. Manuella Ghazi CBC normal, CMP normal, RF negative, anti-CCP negative, ACE negative, ANA negative, anti-DNase B-, Lyme negative, RPR negative, toxoplasmosis negative, TPA negative, proteinase 3 -, MPO negative, ANCA negative, TB Gold negative, SPEP negative, hepatitis A, hepatitis B, hepatitis C negative, HIV negative, HLA-B27 negative  Speciality Comments: No specialty comments available.  Procedures:  No procedures performed Allergies: Gabapentin and Penicillins   Assessment / Plan:      Visit Diagnoses: Panuveitis of both eyes-I reviewed records from [facility].  She has been under care of Dr. Manuella Ghazi.  She had extensive autoimmune work-up which was all negative.  She was placed on CellCept and Humira which she has been taking for couple of months.  She has not noticed any improvement so far.  Have encouraged patient to give more time to the medications.  High risk medication use - CellCept 1500 mg p.o. twice daily, Humira 40 mg subcu every other week.  These medications have been prescribed by Dr. Manuella Ghazi and the labs are monitored by Dr. Manuella Ghazi.  History of glaucoma-according to the chart review she developed glaucoma from the prednisone eyedrops.  Inflammatory arthritis-patient gives history of ongoing pain and inflammation in her left knee joint.  She had a recent episode with left wrist joint pain and swelling which resolved.  I am uncertain about the true etiology.  She has been uveitis.  All extensive autoimmune work-up done by Dr. Gerlene Burdock was reviewed which was negative.  Chronic pain of left knee -she has been having persistent pain in her left knee joint with recurrent Baker's cyst.  She has been followed by Dr. Tamala Julian.  She has had cortisone injections and aspiration of Baker's cyst.  All autoimmune work-up was reviewed today which was negative.  I do not have anything further to add.  Patient is also getting MRI of her knee joint.  I believe if she has an component of inflammatory arthritis should respond to Humira and CellCept combination.  02/22/20: CRP<1, ESR 27, Uric acid 5.3, vitamin D 29.51, HLA-B27 negative - Plan: XR KNEE 3 VIEW LEFT.  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  History of total hip replacement, bilateral-patient states  that she had septic arthritis after hip replacement requiring antibiotics few years back.  She persistently have pain in bilateral hip joints despite being replaced.  Baker's cyst of knee, left-recurrent aspiration per  patient.  Pain in both hands -she states she developed swelling in her left wrist joint which subsided.  Plan: XR Hand 2 View Right, XR Hand 2 View Left.  X-rays are consistent with osteoarthritis.  Possible erosion was noted at the base of the left second proximal phalanx.  Hypertensive retinopathy of both eyes  Lumbar radiculopathy  Acute bursitis of right shoulder-she recalls having a cortisone injection to the right shoulder joint by Dr. Tamala Julian in the past.  Essential hypertension-her blood pressure is well controlled.  Dyslipidemia-she is on lipid-lowering  agents.  Educated about COVID-19 virus infection-she had both vaccinations against COVID-19.  Booster was advised.  Use of mask, social distancing and hand hygiene was emphasized.  I also made her aware that she would be a candidate for COVID-19 monoclonal antibody infusion in case she develops COVID-19 infection.  Orders: Orders Placed This Encounter  Procedures  . XR Hand 2 View Right  . XR Hand 2 View Left  . XR KNEE 3 VIEW LEFT   No orders of the defined types were placed in this encounter.    Follow-Up Instructions: No follow-ups on file.   Bo Merino, MD  Note - This record has been created using Editor, commissioning.  Chart creation errors have been sought, but may not always  have been located. Such creation errors do not reflect on  the standard of medical care.

## 2020-05-08 ENCOUNTER — Ambulatory Visit: Payer: 59 | Admitting: Orthopaedic Surgery

## 2020-05-09 ENCOUNTER — Ambulatory Visit: Payer: 59 | Admitting: Rheumatology

## 2020-05-09 ENCOUNTER — Encounter: Payer: Self-pay | Admitting: Rheumatology

## 2020-05-09 ENCOUNTER — Ambulatory Visit: Payer: Self-pay

## 2020-05-09 ENCOUNTER — Other Ambulatory Visit: Payer: Self-pay

## 2020-05-09 VITALS — BP 121/67 | HR 57 | Resp 16 | Ht 63.0 in | Wt 233.0 lb

## 2020-05-09 DIAGNOSIS — M7122 Synovial cyst of popliteal space [Baker], left knee: Secondary | ICD-10-CM

## 2020-05-09 DIAGNOSIS — H35033 Hypertensive retinopathy, bilateral: Secondary | ICD-10-CM

## 2020-05-09 DIAGNOSIS — E785 Hyperlipidemia, unspecified: Secondary | ICD-10-CM

## 2020-05-09 DIAGNOSIS — Z7189 Other specified counseling: Secondary | ICD-10-CM

## 2020-05-09 DIAGNOSIS — Z8669 Personal history of other diseases of the nervous system and sense organs: Secondary | ICD-10-CM | POA: Diagnosis not present

## 2020-05-09 DIAGNOSIS — M7551 Bursitis of right shoulder: Secondary | ICD-10-CM

## 2020-05-09 DIAGNOSIS — H44113 Panuveitis, bilateral: Secondary | ICD-10-CM | POA: Diagnosis not present

## 2020-05-09 DIAGNOSIS — M79641 Pain in right hand: Secondary | ICD-10-CM

## 2020-05-09 DIAGNOSIS — M25562 Pain in left knee: Secondary | ICD-10-CM | POA: Diagnosis not present

## 2020-05-09 DIAGNOSIS — I10 Essential (primary) hypertension: Secondary | ICD-10-CM

## 2020-05-09 DIAGNOSIS — Z79899 Other long term (current) drug therapy: Secondary | ICD-10-CM | POA: Diagnosis not present

## 2020-05-09 DIAGNOSIS — M5416 Radiculopathy, lumbar region: Secondary | ICD-10-CM

## 2020-05-09 DIAGNOSIS — M79642 Pain in left hand: Secondary | ICD-10-CM | POA: Diagnosis not present

## 2020-05-09 DIAGNOSIS — M1990 Unspecified osteoarthritis, unspecified site: Secondary | ICD-10-CM

## 2020-05-09 DIAGNOSIS — G8929 Other chronic pain: Secondary | ICD-10-CM

## 2020-05-09 DIAGNOSIS — Z96643 Presence of artificial hip joint, bilateral: Secondary | ICD-10-CM

## 2020-05-09 NOTE — Patient Instructions (Signed)

## 2020-05-18 ENCOUNTER — Ambulatory Visit
Admission: RE | Admit: 2020-05-18 | Discharge: 2020-05-18 | Disposition: A | Discharge status: Home / Self Care | Payer: 59 | Source: Ambulatory Visit | Attending: Orthopaedic Surgery | Admitting: Orthopaedic Surgery

## 2020-05-18 DIAGNOSIS — M67462 Ganglion, left knee: Secondary | ICD-10-CM

## 2020-05-18 IMAGING — MR MR KNEE*L* W/O CM
4 of 6 series · 18 of 40 positions shown · non-contrast
Comparison: Radiographs [DATE]

CLINICAL DATA: Injured knee 6 months ago. Persistent pain and
swelling. History of surgery in [DATE].

EXAM:
MRI OF THE LEFT KNEE WITHOUT CONTRAST
TECHNIQUE: Multiplanar, multisequence MR imaging of the knee was performed. No
intravenous contrast was administered.

[Series 6: T2 fat-sat · coronal · 4.0mm · 0.29mm/px · 3 of 24 slices shown (1 of 2)]
[im 5/24]
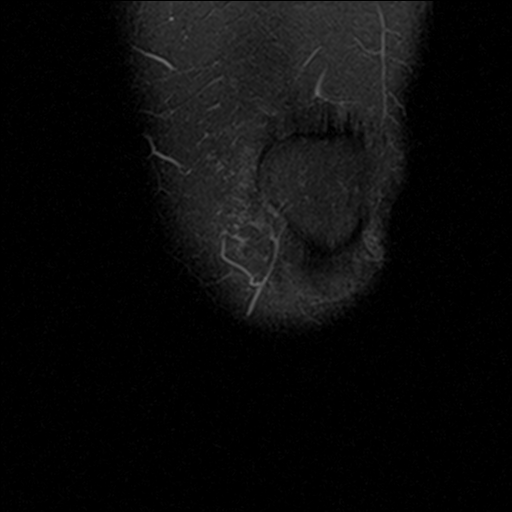
[im 14/24]
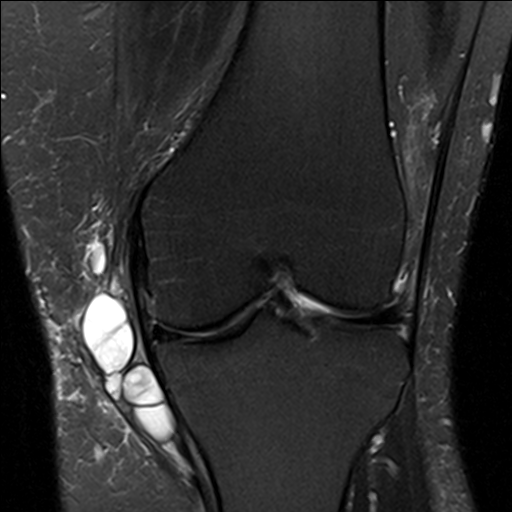
[im 24/24]
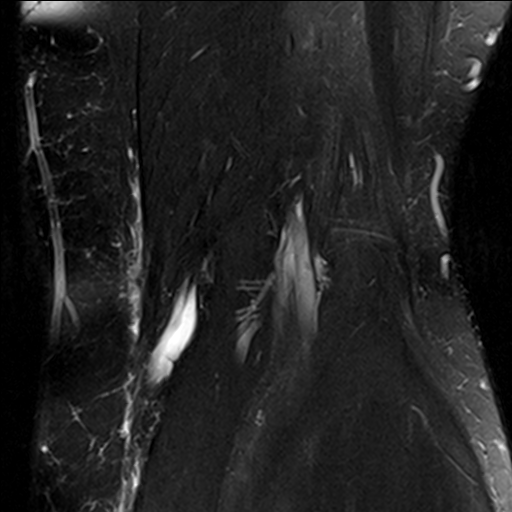

[Series 7: T2 fat-sat · sagittal · 3.0mm · 0.29mm/px · 3 of 27 slices shown (2 of 2)]
[im 5/27]
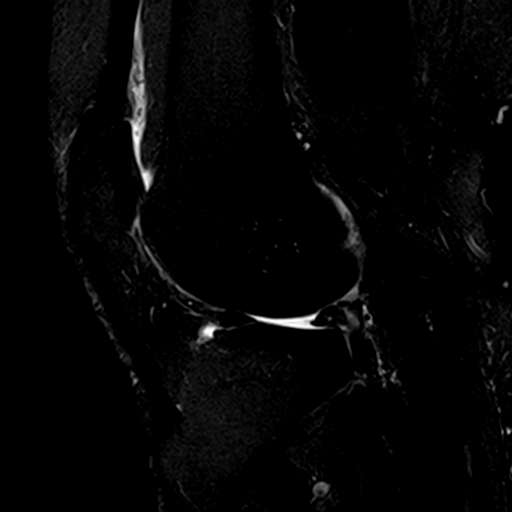
[im 14/27]
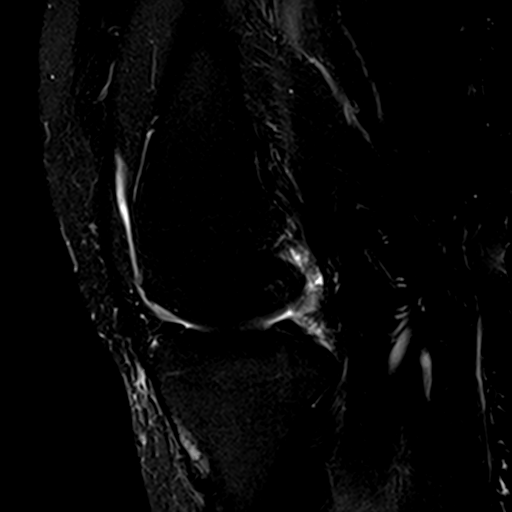
[im 22/27]
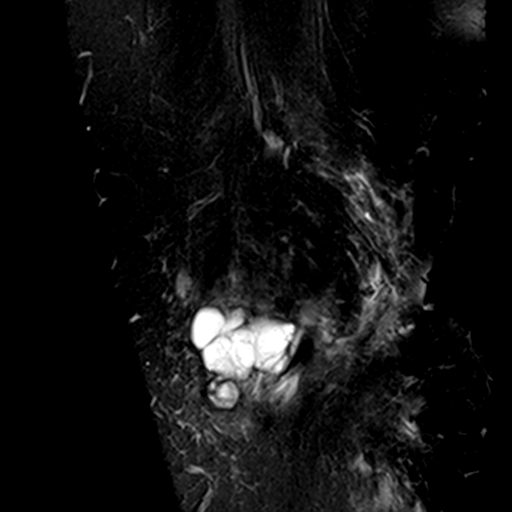

[Series 8: PD fat-sat · sagittal · 3.0mm · 0.29mm/px · 7 of 27 slices shown (1 of 2)]
[im 1/27]
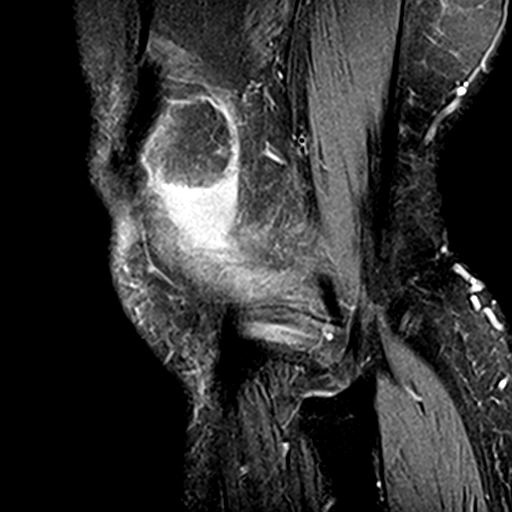
[im 5/27]
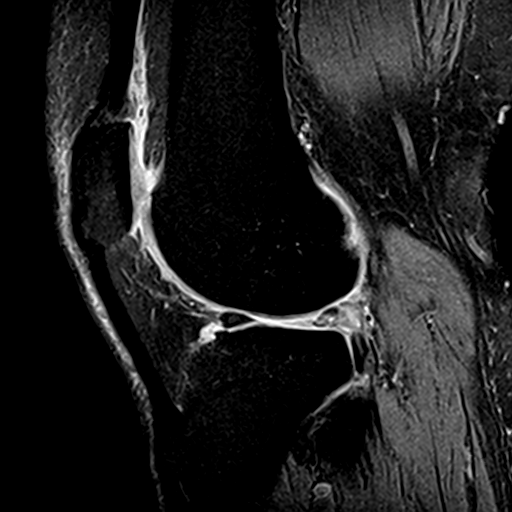
[im 9/27]
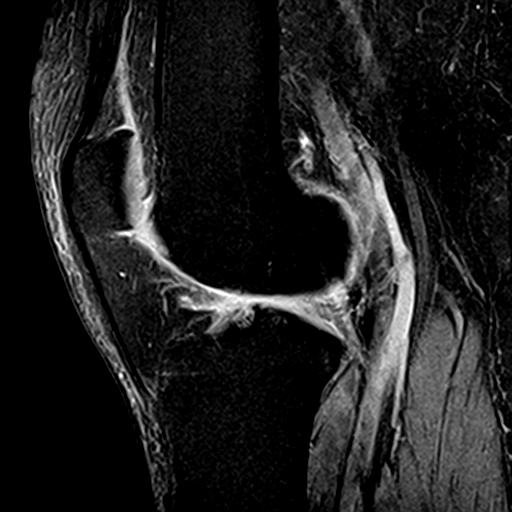
[im 14/27]
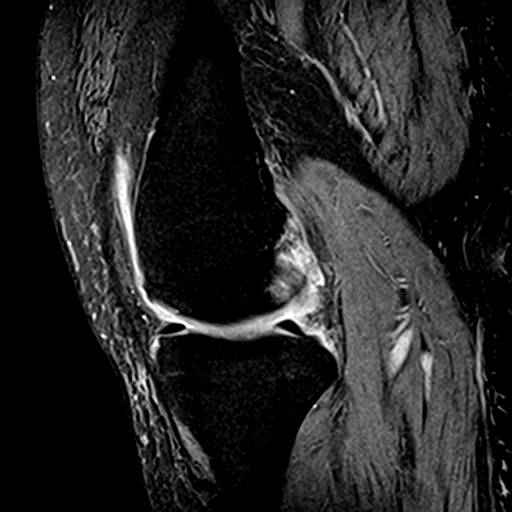
[im 18/27]
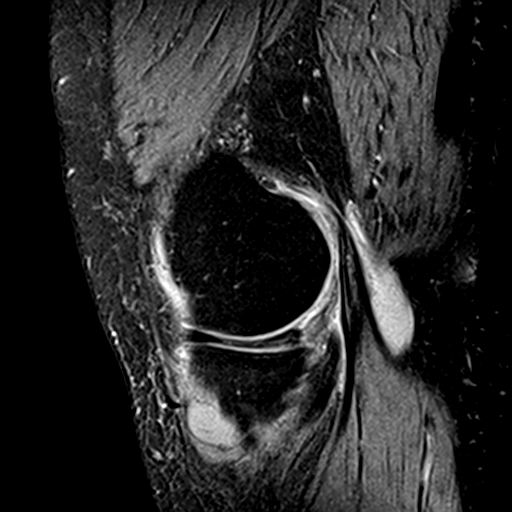
[im 22/27]
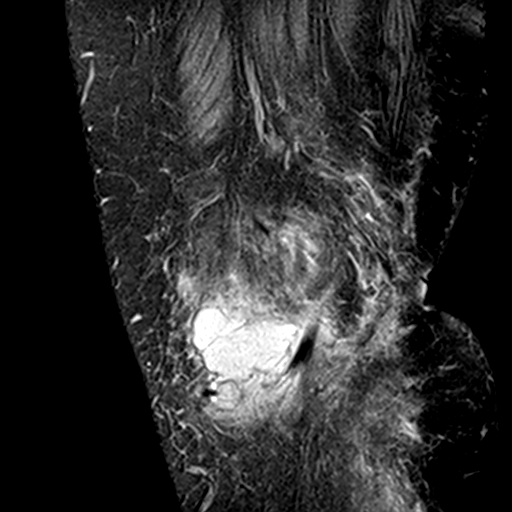
[im 27/27]
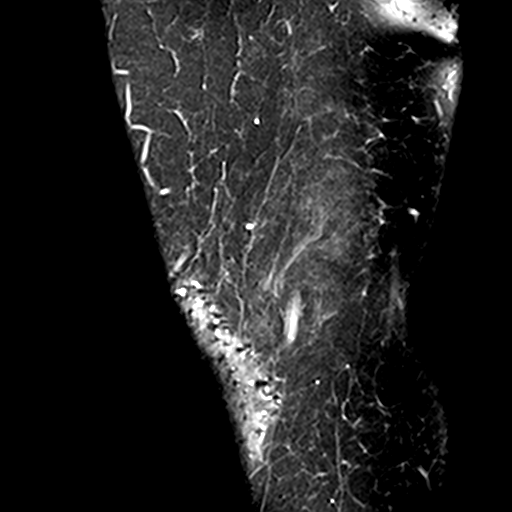

[Series 9: PD fat-sat · coronal · 3.0mm · 0.29mm/px · 5 of 31 slices shown (2 of 2)]
[im 1/31]
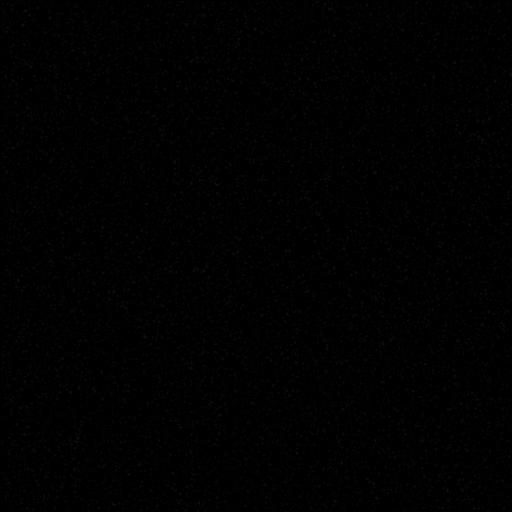
[im 5/31]
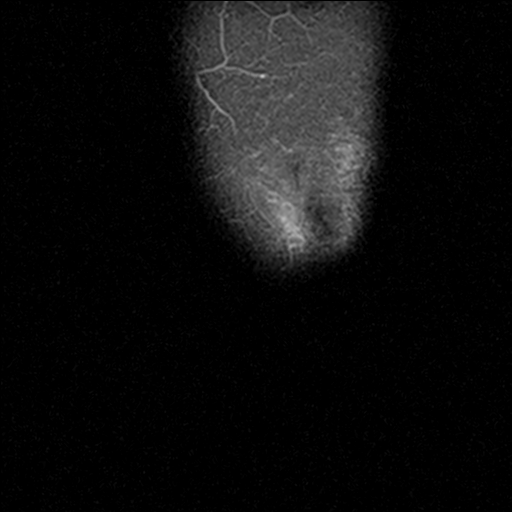
[im 9/31]
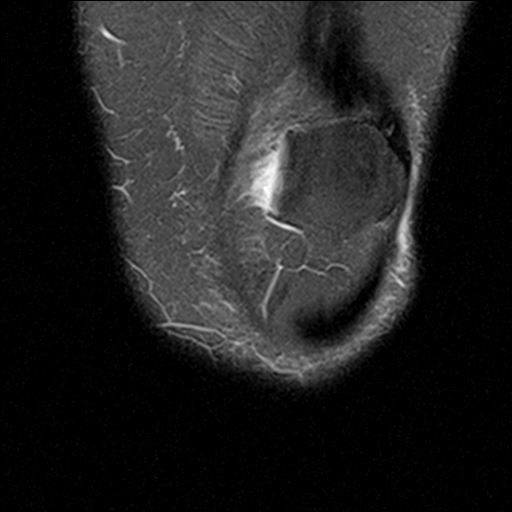
[im 18/31]
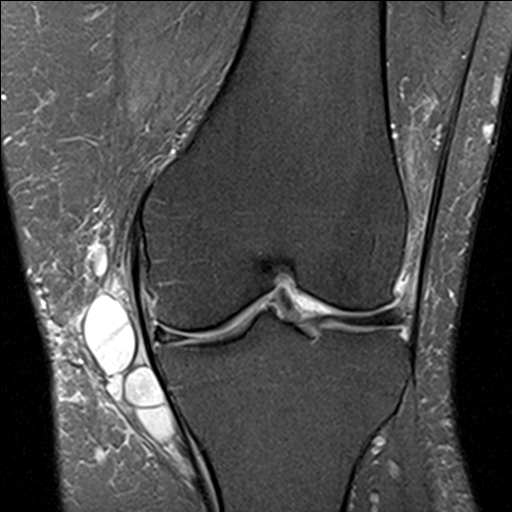
[im 26/31]
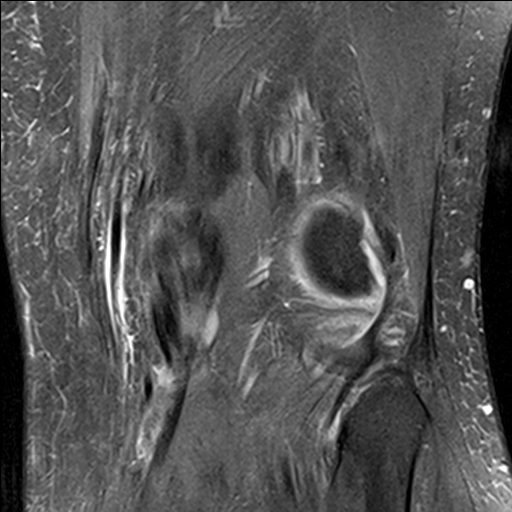

[18 of 40 positions shown; findings below may reference images not displayed]

FINDINGS: MENISCI

Medial meniscus: Mild intrasubstance degenerative changes but no
discrete meniscal tear.

Lateral meniscus: Complex tear involving the posterior horn of the
lateral meniscus which has a somewhat macerated appearance. There is
also blunting in the midbody region.

LIGAMENTS

Cruciates:  Intact

Collaterals:  Intact

CARTILAGE

Patellofemoral:  Mild degenerative chondrosis.

Medial: Moderate degenerative chondrosis with moderate cartilage
thinning and early joint space narrowing.

Lateral: Advanced degenerative chondrosis with areas of near
full-thickness cartilage loss, joint space narrowing and spurring.

Joint:  Small joint effusion.

Popliteal Fossa:  Small Baker's cyst.

Extensor Mechanism: The patella retinacular structures are intact
and the quadriceps and patellar tendons are intact.

Bones:  No acute bony findings.

Other: There is a large complex multi septated cystic lesion along
the medial aspect of the knee superficial to the medial collateral
ligament. It measures approximately 7.2 x 3.0 x 1.6 cm. This is
likely of benign ganglion cyst. I do not see any communication with
the joint or with the small Baker's cyst.
IMPRESSION: 1. Complex tear involving the posterior horn and midbody region of
the lateral meniscus.
2. Intact ligamentous structures and no acute bony findings.
3. Tricompartmental degenerative changes most significant in the
lateral compartment.
4. Small joint effusion and small Baker's cyst.
5. Large complex multi septated cystic lesion along the medial
aspect of the knee superficial to the medial collateral ligament.
This is likely of benign ganglion cyst.

## 2020-05-22 ENCOUNTER — Ambulatory Visit: Payer: 59 | Admitting: Orthopaedic Surgery

## 2020-05-22 ENCOUNTER — Encounter: Payer: Self-pay | Admitting: Orthopaedic Surgery

## 2020-05-22 DIAGNOSIS — M25562 Pain in left knee: Secondary | ICD-10-CM

## 2020-05-22 DIAGNOSIS — G8929 Other chronic pain: Secondary | ICD-10-CM | POA: Diagnosis not present

## 2020-05-22 DIAGNOSIS — M1712 Unilateral primary osteoarthritis, left knee: Secondary | ICD-10-CM | POA: Insufficient documentation

## 2020-05-22 DIAGNOSIS — M67462 Ganglion, left knee: Secondary | ICD-10-CM | POA: Diagnosis not present

## 2020-05-22 NOTE — Progress Notes (Signed)
The patient comes in today to go over the MRI of her left knee.  She has had arthroscopic surgery on that knee before by one of my colleagues in town.  She also has a recurrent ganglion cyst along the medial aspect of her knee.  She does have a history of bilateral hip replacements done elsewhere.  She is 64 years old and very active.  At this point her left knee pain is daily and is detriment affecting her mobility, her quality of life and her actives daily living.  She walks a limp and has pain with and without activities.  She has tried and failed all forms conservative treatment at this point.  She has had arthroscopic intervention as well as steroid and hyaluronic acid injections.  Examination of her left knee does show valgus malalignment.  There is medial lateral joint line tenderness and pain throughout the arc of motion of the knee.  Is ligamentously stable.  There is a palpable cyst along the medial border of the knee.  The MRI of the left knee does show areas of full-thickness cartilage loss in the lateral compartment of the knee as well as moderate cartilage loss in the medial compartment of the knee.  There is a macerated lateral meniscal tear.  There is a recurrent medial ganglion cyst.  This point I recommended knee replacement surgery given her clinical exam findings, signs and symptoms as well as MRI findings of the left knee.  I explained in detail this recommendation and she agrees given amount of pain she is having.  We talked about the interoperative and postoperative course of surgery such as this.  I showed her a knee model and explained what the surgery involves in detail.  I discussed the risk and benefits of surgery as well.  All question concerns were answered and addressed.  We will work on getting this scheduled once we are able to perform surgery where patient stays overnight.  She understands this as well and does have our surgery scheduler's card.

## 2020-06-01 ENCOUNTER — Ambulatory Visit: Payer: 59 | Admitting: Rheumatology

## 2020-06-05 ENCOUNTER — Ambulatory Visit: Payer: 59 | Admitting: Family Medicine

## 2020-06-05 ENCOUNTER — Encounter: Payer: Self-pay | Admitting: Family Medicine

## 2020-06-05 ENCOUNTER — Other Ambulatory Visit: Payer: Self-pay

## 2020-06-05 DIAGNOSIS — M62838 Other muscle spasm: Secondary | ICD-10-CM

## 2020-06-05 MED ORDER — TIZANIDINE HCL 4 MG PO TABS: 4.0000 mg | ORAL_TABLET | Freq: Every day | ORAL | 1 refills | Status: DC

## 2020-06-05 NOTE — Progress Notes (Signed)
[name] [address] [place] [address] Phone: [phone] Subjective:   I Olivia Barnes am serving as a Neurosurgeon for Dr. Antoine Primas.  This visit occurred during the SARS-CoV-2 public health emergency.  Safety protocols were in place, including screening questions prior to the visit, additional usage of staff PPE, and extensive cleaning of exam room while observing appropriate contact time as indicated for disinfecting solutions.   I'm seeing this patient by the request  of:  [name], MD  CC: Neck pain, left knee pain  CVE:[id number]   04/04/2020 Repeat aspiration done today.  Tolerated the procedure well.  I do believe though that patient is not making significant improvement and does have a Baker's cyst as well.  Patient did have attempted surgical intervention but has made no significant benefit.  Has had to have a recurrent aspiration now in 34-month intervals.  I would like patient to follow-up with another orthopedic surgeon to discuss the potential for any type of surgical intervention.  I believe that patient still likely has time to avoid the knee replacement but we will see where response.  Follow-up with me otherwise in 6 to 8 weeks  Update 06/05/2020 Olivia Barnes is a 64 y.o. female coming in with complaint of left knee pain. Has seen Dr. Rayburn Ma who recommends TKR. Patient states she is still having issues with the knee. Feels about the same.   Patient is complaining from neck pain right side.  Feels like there is a lump there.  Does not remember any true injury.  Denies any fevers chills or any abnormal weight loss.    Past Medical History:  Diagnosis Date  . Hyperlipemia   . Hypertension    Past Surgical History:  Procedure Laterality Date  . arm surgery Left    plate in left arm  . CYST EXCISION Left    knee   . REPLACEMENT TOTAL HIP W/  RESURFACING IMPLANTS Bilateral    Social History   Socioeconomic History    . Marital status: Married    Spouse name: Not on file  . Number of children: Not on file  . Years of education: Not on file  . Highest education level: Not on file  Occupational History  . Not on file  Tobacco Use  . Smoking status: Never Smoker  . Smokeless tobacco: Never Used  Vaping Use  . Vaping Use: Never used  Substance and Sexual Activity  . Alcohol use: Not Currently    Alcohol/week: 0.0 standard drinks  . Drug use: Never  . Sexual activity: Not on file  Other Topics Concern  . Not on file  Social History Narrative  . Not on file   Social Determinants of Health   Financial Resource Strain:   . Difficulty of Paying Living Expenses: Not on file  Food Insecurity:   . Worried About Programme researcher, broadcasting/film/video in the Last Year: Not on file  . Ran Out of Food in the Last Year: Not on file  Transportation Needs:   . Lack of Transportation (Medical): Not on file  . Lack of Transportation (Non-Medical): Not on file  Physical Activity:   . Days of Exercise per Week: Not on file  . Minutes of Exercise per Session: Not on file  Stress:   . Feeling of Stress : Not on file  Social Connections:   . Frequency of Communication with Friends and Family: Not on file  . Frequency of Social Gatherings  with Friends and Family: Not on file  . Attends Religious Services: Not on file  . Active Member of Clubs or Organizations: Not on file  . Attends Banker Meetings: Not on file  . Marital Status: Not on file   Allergies  Allergen Reactions  . Gabapentin Nausea And Vomiting  . Penicillins Rash   Family History  Problem Relation Age of Onset  . Heart disease Mother   . Healthy Brother      Current Outpatient Medications (Cardiovascular):  .  losartan-hydrochlorothiazide (HYZAAR) 100-12.5 MG tablet, Take 1 tablet by mouth daily. .  rosuvastatin (CRESTOR) 20 MG tablet, Take 20 mg by mouth daily.   Current Outpatient Medications (Analgesics):  [name]  Adalimumab (HUMIRA) 40  MG/0.4ML PSKT, Inject 40 mg into the skin every 14 (fourteen) days.    Current Outpatient Medications (Other):  .  bimatoprost (LUMIGAN) 0.03 % ophthalmic solution, Place 1 drop into both eyes nightly. .  COMBIGAN 0.2-0.5 % ophthalmic solution, Apply 1 drop to eye 2 (two) times daily. .  Diclofenac Sodium (PENNSAID) 2 % SOLN, Apply 2 Pump topically 2 (two) times daily as needed. .  Diclofenac Sodium 2 % SOLN, Place 2 g onto the skin 2 (two) times daily. .  Multiple Vitamin (MULTIVITAMIN PO), Take by mouth daily. .  mycophenolate (CELLCEPT) 500 MG tablet, Take 1,500 mg by mouth 2 (two) times daily.  .  polyethylene glycol powder (GLYCOLAX/MIRALAX) 17 GM/SCOOP powder, Take by mouth. [name]  tiZANidine (ZANAFLEX) 4 MG tablet, Take 1 tablet (4 mg total) by mouth at bedtime.   Reviewed prior external information including notes and imaging from  primary care provider As well as notes that were available from care everywhere and other healthcare systems.  Past medical history, social, surgical and family history all reviewed in electronic medical record.  No pertanent information unless stated regarding to the chief complaint.   Review of Systems:  No headache, visual changes, nausea, vomiting, diarrhea, constipation, dizziness, abdominal pain, skin rash, fevers, chills, night sweats, weight loss, swollen lymph nodes, body aches, joint swelling, chest pain, shortness of breath, mood changes. POSITIVE muscle aches  Objective  Blood pressure 112/70, pulse (!) 58, height 5\' 3"  (1.6 m), weight 236 lb (107 kg), SpO2 92 %.   General: No apparent distress alert and oriented x3 mood and affect normal, dressed appropriately.  HEENT: Pupils equal, extraocular movements intact  Respiratory: Patient's speak in full sentences and does not appear short of breath  Cardiovascular: No lower extremity edema, non tender, no erythema  Still significant antalgic gait favoring the left knee Right shoulder shows the  patient does have what appears to be a fullness noted in the right trapezius.  Near full range of motion of the shoulder but does have pain.  Does seem to be significantly tight and difficult to assess if there is a mass or not.  He does not feel like any type of lymph node though.  Contralateral side also has some tightness in the trapezius but not quite as much as the right side that is more tender.   Impression and Recommendations:     The above documentation has been reviewed and is accurate and complete , DO

## 2020-06-05 NOTE — Patient Instructions (Addendum)
Good to see you Zanaflex at night Exercise 3 times a week scapular Looks to be a muscle spasm if it enlarges give [place] a call See me again in 4-6 weeks

## 2020-06-05 NOTE — Assessment & Plan Note (Addendum)
Patient has what appears to be more of a neck spasm.  I do not feel a true mass at this moment but it could be early on.  No fevers chills or any abnormal weight loss.  Does not appear to be anything such as a lymph node.  Patient has not had any fevers chills or any abnormal weight loss.  I think at this point her some muscle relaxer could be beneficial.  We discussed icing regimen.  Monitor closely.  Follow-up again in 4 to 8 weeks.  Secondary to the patient's other comorbidities will hold on any more prednisone at this point.  Worsening symptoms we discussed seeking medical attention worsening of symptoms.

## 2020-07-02 NOTE — Progress Notes (Signed)
[name] [address] [place] [address] Phone: [phone] Subjective:   Olivia Barnes, am serving as a scribe for Dr. Antoine Primas. This visit occurred during the SARS-CoV-2 public health emergency.  Safety protocols were in place, including screening questions prior to the visit, additional usage of staff PPE, and extensive cleaning of exam room while observing appropriate contact time as indicated for disinfecting solutions.   I'm seeing this patient by the request  of:  [name], MD  CC: Bilateral knee pain right greater than left  [facility]:[id number]   06/05/2020 Patient has what appears to be more of a neck spasm.  I do not feel a true mass at this moment but it could be early on.  No fevers chills or any abnormal weight loss.  Does not appear to be anything such as a lymph node.  Patient has not had any fevers chills or any abnormal weight loss.  I think at this point her some muscle relaxer could be beneficial.  We discussed icing regimen.  Monitor closely.  Follow-up again in 4 to 8 weeks.  Secondary to the patient's other comorbidities will hold on any more prednisone at this point.  Worsening symptoms we discussed seeking medical attention worsening of symptoms.   Update 07/04/2020 Olivia Barnes is a 64 y.o. female coming in with complaint of bilateral knee pain. Left knee is to be replaced on 07/27/2020. Patient also having pain in right knee. Pain in medial aspect of right knee with all movements. Using Aleve, Tylenol, IBU 800mg  which helped somewhat. Antalgic gait today as patient walks into clinic.  Patient also states that she continues to have neck pain. Unable to sleep at night due to pain. MM relaxer is not helping to ease pain.  Other than that no pain during the day.  Patient states that the spasms she had previously though has improved.       Past Medical History:  Diagnosis Date  . Hyperlipemia   . Hypertension     Past Surgical History:  Procedure Laterality Date  . arm surgery Left    plate in left arm  . CYST EXCISION Left    knee   . 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 Marital status: Married    Spouse name: Not on file  . Number of children: Not on file  . Years of education: Not on file  . Highest education level: Not on file  Occupational History  . Not on file  Tobacco Use  . Smoking status: Never Smoker  . Smokeless tobacco: Never Used  Vaping Use  . Vaping Use: Never used  Substance and Sexual Activity  . Alcohol use: Not Currently    Alcohol/week: 0.0 standard drinks  . Drug use: Never  . Sexual activity: Not on file  Other Topics Concern  . Not on file  Social History Narrative  . Not on file   Social Determinants of Health   Financial Resource Strain:   . Difficulty of Paying Living Expenses: Not on file  Food Insecurity:   . Worried About in the Last Year: Not on file  . Ran Out of Food in the Last Year: Not on file  Transportation Needs:   . Lack of Transportation (Medical): Not on file  . Lack of Transportation (Non-Medical): Not on file  Physical Activity:   . Days of  Exercise per Week: Not on file  . Minutes of Exercise per Session: Not on file  Stress:   . Feeling of Stress : Not on file  Social Connections:   . Frequency of Communication with Friends and Family: Not on file  . Frequency of Social Gatherings with Friends and Family: Not on file  . Attends Religious Services: Not on file  . Active Member of Clubs or Organizations: Not on file  . Attends Banker Meetings: Not on file  . Marital Status: Not on file   Allergies  Allergen Reactions  . Gabapentin Nausea And Vomiting  . Penicillins Rash   Family History  Problem Relation Age of Onset  . Heart disease Mother   . Healthy Brother      Current Outpatient Medications (Cardiovascular):   .  losartan-hydrochlorothiazide (HYZAAR) 100-12.5 MG tablet, Take 1 tablet by mouth daily. .  rosuvastatin (CRESTOR) 20 MG tablet, Take 20 mg by mouth daily.   Current Outpatient Medications (Analgesics):  [name]  Adalimumab (HUMIRA) 40 MG/0.4ML PSKT, Inject 40 mg into the skin every 14 (fourteen) days.    Current Outpatient Medications (Other):  .  bimatoprost (LUMIGAN) 0.03 % ophthalmic solution, Place 1 drop into both eyes nightly. .  COMBIGAN 0.2-0.5 % ophthalmic solution, Apply 1 drop to eye 2 (two) times daily. .  Diclofenac Sodium (PENNSAID) 2 % SOLN, Apply 2 Pump topically 2 (two) times daily as needed. .  Diclofenac Sodium 2 % SOLN, Place 2 g onto the skin 2 (two) times daily. .  Multiple Vitamin (MULTIVITAMIN PO), Take by mouth daily. .  mycophenolate (CELLCEPT) 500 MG tablet, Take 1,500 mg by mouth 2 (two) times daily.  .  polyethylene glycol powder (GLYCOLAX/MIRALAX) 17 GM/SCOOP powder, Take by mouth. .  tacrolimus (PROGRAF) 1 MG capsule, Take 1 mg by mouth 2 (two) times daily. [name]  tiZANidine (ZANAFLEX) 4 MG tablet, Take 1 tablet (4 mg total) by mouth at bedtime.   Reviewed prior external information including notes and imaging from  primary care provider As well as notes that were available from care everywhere and other healthcare systems.  Past medical history, social, surgical and family history all reviewed in electronic medical record.  No pertanent information unless stated regarding to the chief complaint.   Review of Systems:  No headache, visual changes, nausea, vomiting, diarrhea, constipation, dizziness, abdominal pain, skin rash, fevers, chills, night sweats, weight loss, swollen lymph nodes,  chest pain, shortness of breath, mood changes. POSITIVE muscle aches, body aches, joint swelling  Objective  Blood pressure 108/80, pulse 60, height 5\' 3"  (1.6 m), weight 231 lb (104.8 kg), SpO2 99 %.   General: No apparent distress alert and oriented x3 mood and affect  normal, dressed appropriately.  HEENT: Pupils equal, extraocular movements intact  Respiratory: Patient's speak in full sentences and does not appear short of breath  Cardiovascular: No lower extremity edema, non tender, no erythema  Antalgic gait noted.  Favoring the right knee which is different than usual. Patient's knee on the right side does not have any instability.  Patient does have mild positive patellar grind test.  Patient does have tenderness to palpation over the medial joint space.  Negative McMurray's.  Full range of motion.  Limited musculoskeletal ultrasound was performed and interpreted by  Limited ultrasound of patient's right knee shows the patient does have a trace effusion noted of the patellofemoral joint with narrowing of the patellofemoral joint.  Patient also has moderate  arthritic changes of the medial and lateral compartment.  No meniscus pathology though noted on ultrasound today. Impression: Knee arthritis with trace effusion  After informed written and verbal consent, patient was seated on exam table. Right knee was prepped with alcohol swab and utilizing anterolateral approach, patient's right knee space was injected with 4:1  marcaine 0.5%: Kenalog 40mg /dL. Patient tolerated the procedure well without immediate complications.    Impression and Recommendations:     The above documentation has been reviewed and is accurate and complete , DO

## 2020-07-03 ENCOUNTER — Ambulatory Visit: Payer: 59 | Admitting: Rheumatology

## 2020-07-03 ENCOUNTER — Other Ambulatory Visit: Payer: Self-pay

## 2020-07-04 ENCOUNTER — Ambulatory Visit: Payer: Self-pay

## 2020-07-04 ENCOUNTER — Encounter: Payer: Self-pay | Admitting: Family Medicine

## 2020-07-04 ENCOUNTER — Ambulatory Visit (INDEPENDENT_AMBULATORY_CARE_PROVIDER_SITE_OTHER): Payer: 59

## 2020-07-04 ENCOUNTER — Other Ambulatory Visit: Payer: Self-pay

## 2020-07-04 ENCOUNTER — Ambulatory Visit: Payer: 59 | Admitting: Family Medicine

## 2020-07-04 VITALS — BP 108/80 | HR 60 | Ht 63.0 in | Wt 231.0 lb

## 2020-07-04 DIAGNOSIS — M1711 Unilateral primary osteoarthritis, right knee: Secondary | ICD-10-CM | POA: Insufficient documentation

## 2020-07-04 DIAGNOSIS — G8929 Other chronic pain: Secondary | ICD-10-CM

## 2020-07-04 DIAGNOSIS — M25561 Pain in right knee: Secondary | ICD-10-CM

## 2020-07-04 DIAGNOSIS — M62838 Other muscle spasm: Secondary | ICD-10-CM | POA: Diagnosis not present

## 2020-07-04 IMAGING — DX DG KNEE 3 VIEWS*R*
3 series · 3 of 3 positions shown · non-contrast
Comparison: None.

CLINICAL DATA: Chronic right knee pain.

EXAM:
RIGHT KNEE - 3 VIEW

[knee ap]
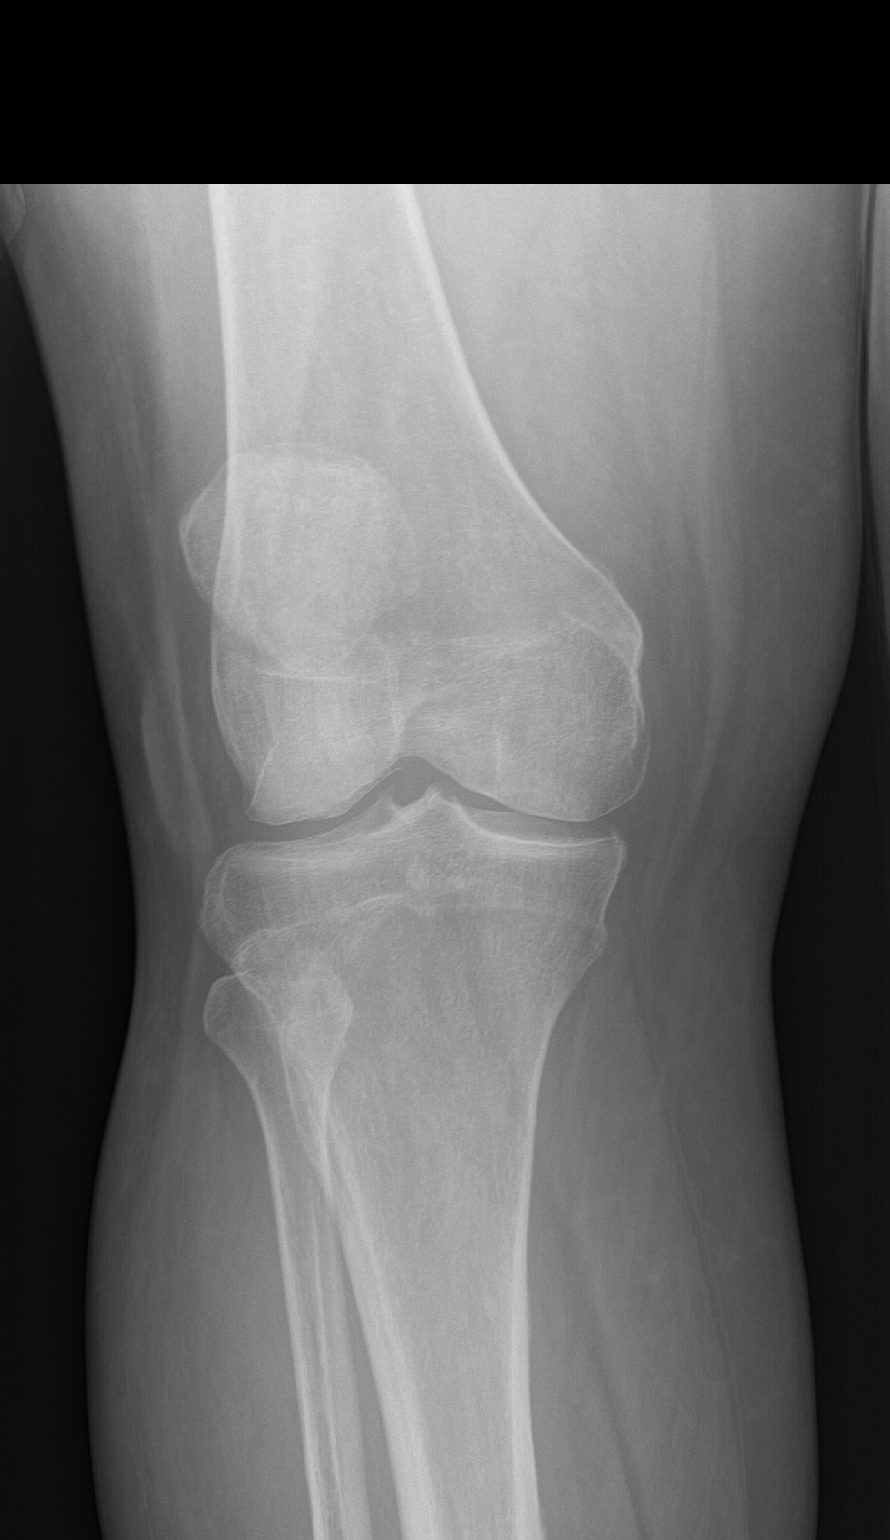

[knee lat]
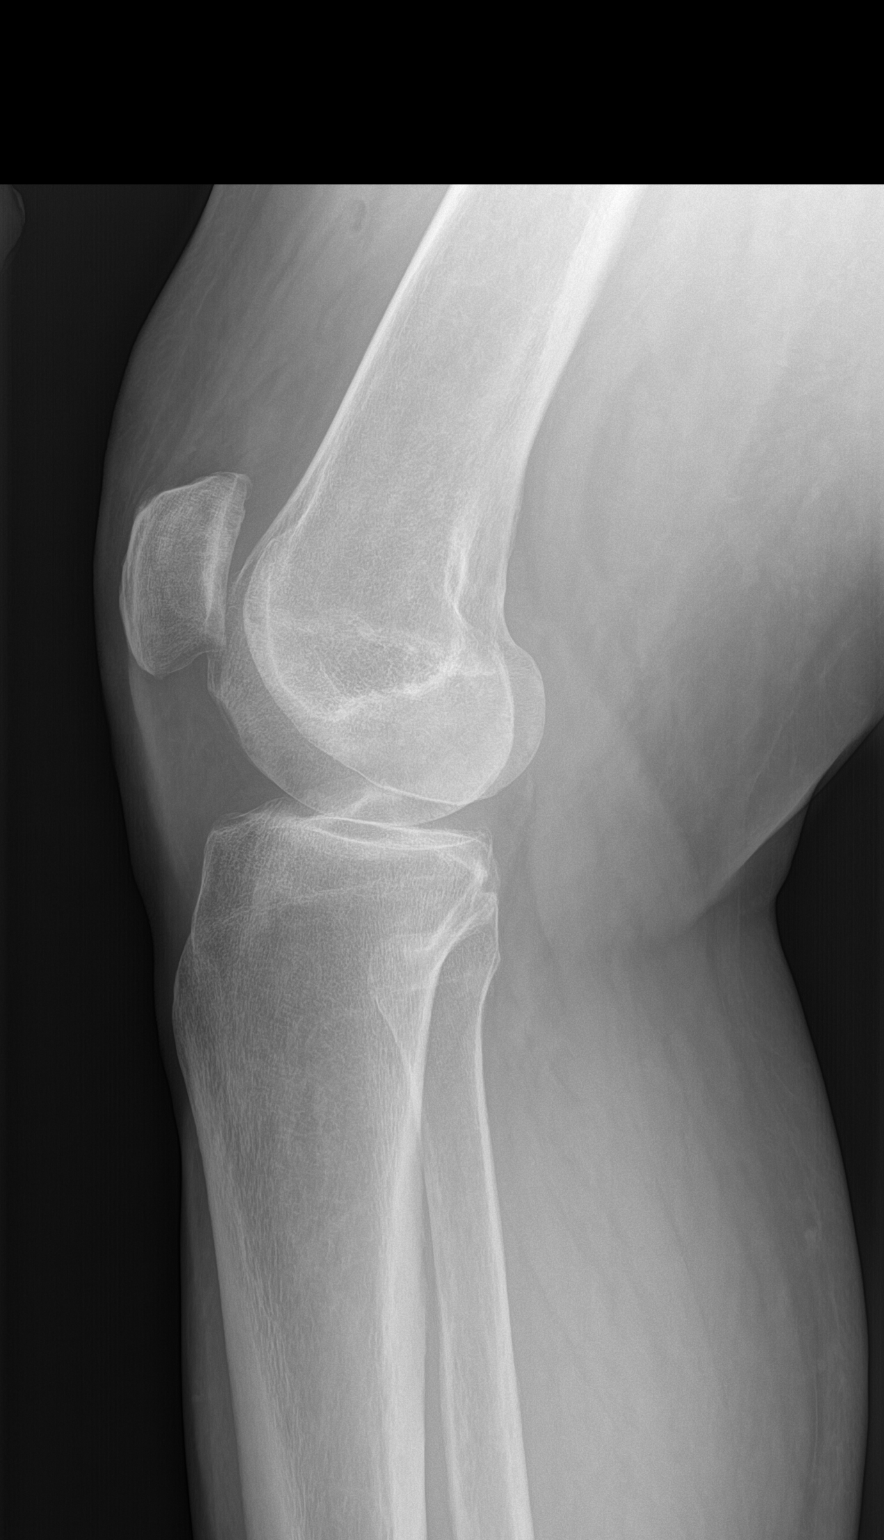

[patella]
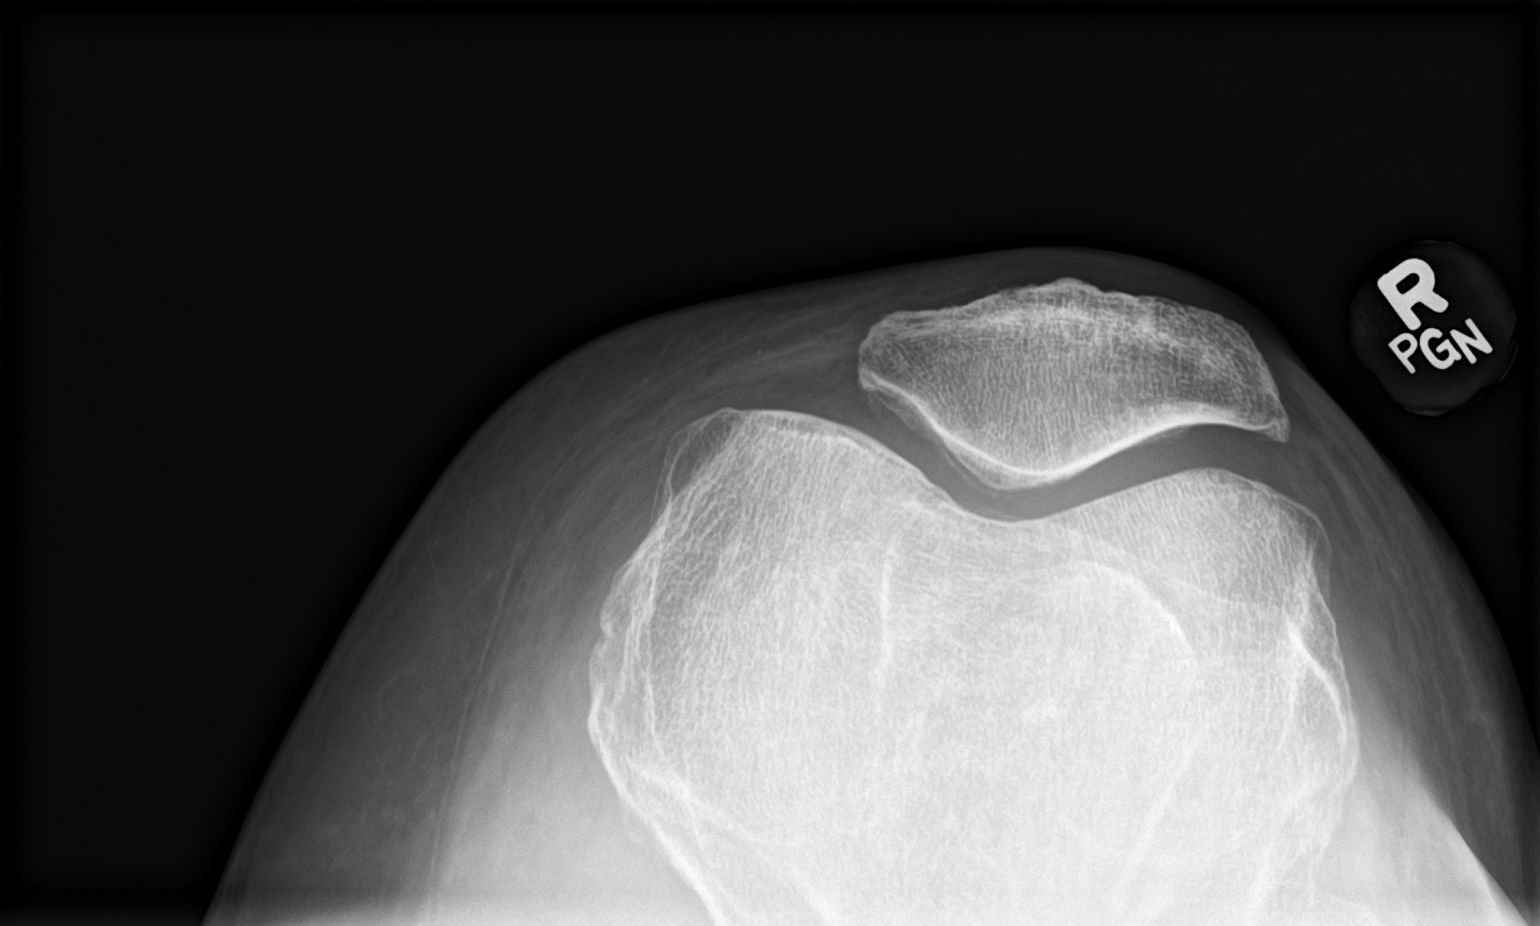

[3 of 3 positions shown; findings below may reference images not displayed]

FINDINGS: No acute fracture or dislocation. No joint effusion. Joint spaces
are preserved. Tiny tricompartmental marginal osteophytes. Bone
mineralization is normal. Soft tissues are unremarkable.
IMPRESSION: 1. Minimal tricompartmental degenerative changes.

## 2020-07-04 NOTE — Assessment & Plan Note (Signed)
Injection given today.  Patient needs to have his knee feeling better with her having replacement scheduled on the contralateral side.  Discussed with patient icing regimen and home exercise.  X-rays pending.  Would be surprised if there is any other finding other than arthritic changes.  Ultrasound did show a very small effusion.  Increase activity slowly.  Follow-up with me again 6 weeks after surgery.

## 2020-07-04 NOTE — Patient Instructions (Signed)
Stop falling apart Xray on way out Voltaren over the counter See me again in 6 weeks after surgery

## 2020-07-04 NOTE — Assessment & Plan Note (Signed)
Patient continues to have some mild neck pain but only at night.  Spasm is improved but patient states it did not help with the pain.  We discussed changing the pillow at the moment with her only having pain at night.  Worsening pain will consider further work-up to once again multiple different problems patient has including surgery coming up.

## 2020-07-10 NOTE — Progress Notes (Deleted)
Office Visit Note  Patient: Olivia Barnes             Date of Birth: 1956/01/25           MRN: [id number]             PCP: Charleston Poot, MD Referring: Charleston Poot, MD Visit Date: 07/16/2020 Occupation: _0 @  Subjective:  No chief complaint on file.   History of Present Illness: Olivia Barnes is a 64 y.o. female ***   Activities of Daily Living:  Patient reports morning stiffness for *** {minute/hour:19697}.   Patient {ACTIONS;DENIES/REPORTS:21021675::"Denies"} nocturnal pain.  Difficulty dressing/grooming: {ACTIONS;DENIES/REPORTS:21021675::"Denies"} Difficulty climbing stairs: {ACTIONS;DENIES/REPORTS:21021675::"Denies"} Difficulty getting out of chair: {ACTIONS;DENIES/REPORTS:21021675::"Denies"} Difficulty using hands for taps, buttons, cutlery, and/or writing: {ACTIONS;DENIES/REPORTS:21021675::"Denies"}  No Rheumatology ROS completed.   PMFS History:  Patient Active Problem List   Diagnosis Date Noted  . Degenerative arthritis of right knee 07/04/2020  . Neck muscle spasm 06/05/2020  . Unilateral primary osteoarthritis, left knee 05/22/2020  . Uveitis 01/11/2020  . Acute bursitis of right shoulder 12/06/2019  . Baker's cyst of knee, left 09/30/2019  . Ganglion of left knee 03/11/2019  . Tear of left hamstring 03/01/2019  . 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 Lumbar radiculopathy 03/20/2016  . Arthritis of right hip 10/20/2014  . Septic arthritis ([place]) 07/13/2014  . Transient synovitis of wrist 07/13/2014  . CN (constipation) 07/13/2014  . Essential hypertension 07/13/2014  . Primary osteoarthritis of right hip 07/13/2014    Past Medical History:  Diagnosis Date  . Hyperlipemia   . Hypertension     Family History  Problem Relation Age of Onset  . Heart disease Mother   . Healthy Brother    Past Surgical History:  Procedure Laterality Date  . arm surgery Left    plate in left arm  . CYST  EXCISION Left    knee   . 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 Not on file   Immunization History  Administered Date(s) Administered  . PFIZER SARS-COV-2 Vaccination 09/15/2019, 10/04/2019     Objective: Vital Signs: There were no vitals taken for this visit.   Physical Exam   Musculoskeletal Exam: ***  CDAI Exam: CDAI Score: -- Patient Global: --; Provider Global: -- Swollen: --; Tender: -- Joint Exam 07/16/2020   No joint exam has been documented for this visit   There is currently no information documented on the homunculus. Go to the Rheumatology activity and complete the homunculus joint exam.  Investigation: No additional findings.  Imaging: [place] LIMITED JOINT SPACE STRUCTURES LOW RIGHT(NO LINKED CHARGES)  Result Date: 07/06/2020 Limited musculoskeletal ultrasound was performed and interpreted by Lyndal Pulley Limited ultrasound of patient's right knee shows the patient does have a trace effusion noted of the patellofemoral joint with narrowing of the patellofemoral joint.  Patient also has moderate arthritic changes of the medial and lateral compartment.  No meniscus pathology though noted on ultrasound today. Impression: Knee arthritis with trace effusion  DG Knee 3 Views Right  Result Date: 07/04/2020 CLINICAL DATA:  Chronic right knee pain. EXAM: RIGHT KNEE - 3 VIEW COMPARISON:  None. FINDINGS: No acute fracture or dislocation. No joint effusion. Joint spaces are preserved. Tiny tricompartmental marginal osteophytes. Bone mineralization is normal. Soft tissues are unremarkable. IMPRESSION: 1. Minimal tricompartmental degenerative changes. Electronically Signed   By: Titus Dubin M.D.   On: 07/04/2020  15:38    Recent Labs: Lab Results  Component Value Date   WBC 6.9 12/06/2019   HGB 13.8 12/06/2019   PLT 204.0 12/06/2019   NA 145 07/06/2010   K 4.4 07/06/2010   CL 109 07/06/2010    CO2 27 07/06/2010   GLUCOSE 93 07/06/2010   BUN 18 07/06/2010   CREATININE 0.8 07/06/2010   CALCIUM 9.6 07/06/2010   GFRAA  07/06/2010    >60        The eGFR has been calculated using the MDRD equation. This calculation has not been validated in all clinical situations. eGFR's persistently <60 mL/min signify possible Chronic Kidney Disease.    Speciality Comments: No specialty comments available.  Procedures:  No procedures performed Allergies: Gabapentin and Penicillins   Assessment / Plan:     Visit Diagnoses: No diagnosis found.  Orders: No orders of the defined types were placed in this encounter.  No orders of the defined types were placed in this encounter.   Face-to-face time spent with patient was *** minutes. Greater than 50% of time was spent in counseling and coordination of care.  Follow-Up Instructions: No follow-ups on file.   Bo Merino, MD  Note - This record has been created using Editor, commissioning.  Chart creation errors have been sought, but may not always  have been located. Such creation errors do not reflect on  the standard of medical care.

## 2020-07-16 ENCOUNTER — Ambulatory Visit: Payer: 59 | Admitting: Rheumatology

## 2020-07-16 ENCOUNTER — Other Ambulatory Visit: Payer: Self-pay | Admitting: Family Medicine

## 2020-07-16 DIAGNOSIS — M7122 Synovial cyst of popliteal space [Baker], left knee: Secondary | ICD-10-CM

## 2020-07-16 DIAGNOSIS — M1990 Unspecified osteoarthritis, unspecified site: Secondary | ICD-10-CM

## 2020-07-16 DIAGNOSIS — M19041 Primary osteoarthritis, right hand: Secondary | ICD-10-CM

## 2020-07-16 DIAGNOSIS — Z96643 Presence of artificial hip joint, bilateral: Secondary | ICD-10-CM

## 2020-07-16 DIAGNOSIS — Z8669 Personal history of other diseases of the nervous system and sense organs: Secondary | ICD-10-CM

## 2020-07-16 DIAGNOSIS — E785 Hyperlipidemia, unspecified: Secondary | ICD-10-CM

## 2020-07-16 DIAGNOSIS — M5416 Radiculopathy, lumbar region: Secondary | ICD-10-CM

## 2020-07-16 DIAGNOSIS — H44113 Panuveitis, bilateral: Secondary | ICD-10-CM

## 2020-07-16 DIAGNOSIS — I10 Essential (primary) hypertension: Secondary | ICD-10-CM

## 2020-07-16 DIAGNOSIS — M7551 Bursitis of right shoulder: Secondary | ICD-10-CM

## 2020-07-16 DIAGNOSIS — H35033 Hypertensive retinopathy, bilateral: Secondary | ICD-10-CM

## 2020-07-16 DIAGNOSIS — M79641 Pain in right hand: Secondary | ICD-10-CM

## 2020-07-16 DIAGNOSIS — G8929 Other chronic pain: Secondary | ICD-10-CM

## 2020-07-16 DIAGNOSIS — Z79899 Other long term (current) drug therapy: Secondary | ICD-10-CM

## 2020-07-16 NOTE — Progress Notes (Signed)
Need orders in epic.  

## 2020-07-17 ENCOUNTER — Encounter (HOSPITAL_COMMUNITY): Payer: Self-pay

## 2020-07-17 ENCOUNTER — Other Ambulatory Visit: Payer: Self-pay

## 2020-07-17 ENCOUNTER — Other Ambulatory Visit: Payer: Self-pay | Admitting: Physician Assistant

## 2020-07-17 ENCOUNTER — Encounter (HOSPITAL_COMMUNITY)
Admission: RE | Admit: 2020-07-17 | Discharge: 2020-07-17 | Disposition: A | Discharge status: Home / Self Care | Payer: 59 | Source: Ambulatory Visit | Attending: Orthopaedic Surgery | Admitting: Orthopaedic Surgery

## 2020-07-17 DIAGNOSIS — Z01812 Encounter for preprocedural laboratory examination: Secondary | ICD-10-CM | POA: Insufficient documentation

## 2020-07-17 HISTORY — DX: Unspecified osteoarthritis, unspecified site: M19.90

## 2020-07-17 LAB — BASIC METABOLIC PANEL
Anion gap: 8 (ref 5–15)
BUN: 24 mg/dL — ABNORMAL HIGH (ref 8–23)
CO2: 24 mmol/L (ref 22–32)
Calcium: 9.3 mg/dL (ref 8.9–10.3)
Chloride: 107 mmol/L (ref 98–111)
Creatinine, Ser: 0.81 mg/dL (ref 0.44–1.00)
GFR, Estimated: >60 mL/min (ref >60)
Glucose, Bld: 110 mg/dL — ABNORMAL HIGH (ref 70–99)
Potassium: 4.1 mmol/L (ref 3.5–5.1)
Sodium: 139 mmol/L (ref 135–145)

## 2020-07-17 LAB — CBC
HCT: 41 % (ref 36.0–46.0)
Hemoglobin: 13.1 g/dL (ref 12.0–15.0)
MCH: 29.4 pg (ref 26.0–34.0)
MCHC: 32 g/dL (ref 30.0–36.0)
MCV: 91.9 fL (ref 80.0–100.0)
Platelets: 171 10*3/uL (ref 150–400)
RBC: 4.46 MIL/uL (ref 3.87–5.11)
RDW: 13.4 % (ref 11.5–15.5)
WBC: 5.9 10*3/uL (ref 4.0–10.5)
nRBC: 0 % (ref 0.0–0.2)

## 2020-07-17 LAB — SURGICAL PCR SCREEN
MRSA, PCR: NEGATIVE
Staphylococcus aureus: NEGATIVE

## 2020-07-17 NOTE — Progress Notes (Signed)
DUE TO COVID-19 ONLY ONE VISITOR IS ALLOWED TO COME WITH YOU AND STAY IN THE WAITING ROOM ONLY DURING PRE OP AND PROCEDURE DAY OF SURGERY. THE 1 VISITOR  MAY VISIT WITH YOU AFTER SURGERY IN YOUR PRIVATE ROOM DURING VISITING HOURS ONLY!  YOU NEED TO HAVE A COVID 19 TEST ON_11/30/2021 ______ @_______ , THIS TEST MUST BE DONE BEFORE SURGERY,  COVID TESTING SITE [address] [place] , IT IS ON THE RIGHT GOING OUT [address] APPROXIMATELY  2 MINUTES PAST [employer] ON THE RIGHT. ONCE YOUR COVID TEST IS COMPLETED,  PLEASE BEGIN THE QUARANTINE INSTRUCTIONS AS OUTLINED IN YOUR HANDOUT.                Olivia Barnes  07/17/2020   Your procedure is scheduled on: 07/27/2020    Report to [facility] Main  Entrance   Report to admitting at   0715 AM     Call this number if you have problems the morning of surgery [phone]    REMEMBER: NO  SOLID FOOD CANDY OR GUM AFTER MIDNIGHT. CLEAR LIQUIDS UNTIL 0645am        . NOTHING BY MOUTH EXCEPT CLEAR LIQUIDS UNTIL    . PLEASE FINISH ENSURE DRINK PER SURGEON ORDER  WHICH NEEDS TO BE COMPLETED AT     0645am  .      CLEAR LIQUID DIET   Foods Allowed                                                                    Coffee and tea, regular and decaf                            Fruit ices (not with fruit pulp)                                      Iced Popsicles                                    Carbonated beverages, regular and diet                                    Cranberry, grape and apple juices Sports drinks like Gatorade Lightly seasoned clear broth or consume(fat free) Sugar, honey syrup ___________________________________________________________________      BRUSH YOUR TEETH MORNING OF SURGERY AND RINSE YOUR MOUTH OUT, NO CHEWING GUM CANDY OR MINTS.     Take these medicines the morning of surgery with A SIP OF WATER: cellcept,prograf, eye drops as usual   DO NOT TAKE ANY DIABETIC MEDICATIONS  DAY OF YOUR SURGERY                               You may not have any metal on your body including hair pins and              piercings  Do  not wear jewelry, make-up, lotions, powders or perfumes, deodorant             Do not wear nail polish on your fingernails.  Do not shave  48 hours prior to surgery.              Men may shave face and neck.   Do not bring valuables to the hospital. [place].  Contacts, dentures or bridgework may not be worn into surgery.  Leave suitcase in the car. After surgery it may be brought to your room.     Patients discharged the day of surgery will not be allowed to drive home. IF YOU ARE HAVING SURGERY AND GOING HOME THE SAME DAY, YOU MUST HAVE AN ADULT TO DRIVE YOU HOME AND BE WITH YOU FOR 24 HOURS. YOU MAY GO HOME BY TAXI OR UBER OR ORTHERWISE, BUT AN ADULT MUST ACCOMPANY YOU HOME AND STAY WITH YOU FOR 24 HOURS.  Name and phone number of your driver:  Special Instructions: N/A              Please read over the following fact sheets you were given: _____________________________________________________________________  [facility] - Preparing for Surgery Before surgery, you can play an important role.  Because skin is not sterile, your skin needs to be as free of germs as possible.  You can reduce the number of germs on your skin by washing with CHG (chlorahexidine gluconate) soap before surgery.  CHG is an antiseptic cleaner which kills germs and bonds with the skin to continue killing germs even after washing. Please DO NOT use if you have an allergy to CHG or antibacterial soaps.  If your skin becomes reddened/irritated stop using the CHG and inform your nurse when you arrive at Short Stay. Do not shave (including legs and underarms) for at least 48 hours prior to the first CHG shower.  You may shave your face/neck. Please follow these instructions carefully:  1.  Shower with CHG Soap the night before  surgery and the  morning of Surgery.  2.  If you choose to wash your hair, wash your hair first as usual with your  normal  shampoo.  3.  After you shampoo, rinse your hair and body thoroughly to remove the  shampoo.                           4.  Use CHG as you would any other liquid soap.  You can apply chg directly  to the skin and wash                       Gently with a scrungie or clean washcloth.  5.  Apply the CHG Soap to your body ONLY FROM THE NECK DOWN.   Do not use on face/ open                           Wound or open sores. Avoid contact with eyes, ears mouth and genitals (private parts).                       Wash face,  Genitals (private parts) with your normal soap.             6.  Wash thoroughly,  paying special attention to the area where your surgery  will be performed.  7.  Thoroughly rinse your body with warm water from the neck down.  8.  DO NOT shower/wash with your normal soap after using and rinsing off  the CHG Soap.                9.  Pat yourself dry with a clean towel.            10.  Wear clean pajamas.            11.  Place clean sheets on your bed the night of your first shower and do not  sleep with pets. Day of Surgery : Do not apply any lotions/deodorants the morning of surgery.  Please wear clean clothes to the hospital/surgery center.  FAILURE TO FOLLOW THESE INSTRUCTIONS MAY RESULT IN THE CANCELLATION OF YOUR SURGERY PATIENT SIGNATURE_________________________________  NURSE SIGNATURE__________________________________  ________________________________________________________________________

## 2020-07-17 NOTE — Progress Notes (Addendum)
Anesthesia Review:  PCP: Followed by DR Sherryll Burger at [facility] for eye condition.  Have rquested LOV from office of DR Sherryll Burger.  They are to send  Cardiologist : Chest x-ray : EKG :have requested 12 lead ekg tracing from [facility] done 11/2019. EKG done 12/10/19 on chart.  Echo : Stress test: Cardiac Cath :  Activity level: can do a flight of stairs without difficulty  Sleep Study/ CPAP : no  Fasting Blood Sugar :      / Checks Blood Sugar -- times a day:   Blood Thinner/ Instructions /Last Dose: ASA / Instructions/ Last Dose :

## 2020-07-24 ENCOUNTER — Other Ambulatory Visit (HOSPITAL_COMMUNITY)
Admission: RE | Admit: 2020-07-24 | Discharge: 2020-07-24 | Disposition: A | Discharge status: Home / Self Care | Payer: 59 | Source: Ambulatory Visit | Attending: Orthopaedic Surgery | Admitting: Orthopaedic Surgery

## 2020-07-24 DIAGNOSIS — Z01812 Encounter for preprocedural laboratory examination: Secondary | ICD-10-CM | POA: Diagnosis not present

## 2020-07-24 DIAGNOSIS — Z20822 Contact with and (suspected) exposure to covid-19: Secondary | ICD-10-CM | POA: Insufficient documentation

## 2020-07-24 LAB — SARS CORONAVIRUS 2 (TAT 6-24 HRS): SARS Coronavirus 2: NEGATIVE

## 2020-07-26 NOTE — H&P (Signed)
TOTAL KNEE ADMISSION H&P  Patient is being admitted for left total knee arthroplasty.  Subjective:  Chief Complaint:left knee pain.  HPI: Olivia Barnes, 64 y.o. female, has a history of pain and functional disability in the left knee due to arthritis and has failed non-surgical conservative treatments for greater than 12 weeks to includeNSAID's and/or analgesics, corticosteriod injections, flexibility and strengthening excercises and activity modification.  Onset of symptoms was gradual, starting 2 years ago with gradually worsening course since that time. The patient noted prior procedures on the knee to include  arthroscopy on the left knee(s).  Patient currently rates pain in the left knee(s) at 10 out of 10 with activity. Patient has night pain, worsening of pain with activity and weight bearing, pain that interferes with activities of daily living, pain with passive range of motion, crepitus and joint swelling.  Patient has evidence of subchondral cysts, subchondral sclerosis, periarticular osteophytes and joint space narrowing by imaging studies. There is no active infection.  Patient Active Problem List   Diagnosis Date Noted  . Degenerative arthritis of right knee 07/04/2020  . Neck muscle spasm 06/05/2020  . Unilateral primary osteoarthritis, left knee 05/22/2020  . Uveitis 01/11/2020  . Acute bursitis of right shoulder 12/06/2019  . Baker's cyst of knee, left 09/30/2019  . Ganglion of left knee 03/11/2019  . Tear of left hamstring 03/01/2019  . 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 Lumbar radiculopathy 03/20/2016  . Arthritis of right hip 10/20/2014  . Septic arthritis (HCC) 07/13/2014  . Transient synovitis of wrist 07/13/2014  . CN (constipation) 07/13/2014  . Essential hypertension 07/13/2014  . Primary osteoarthritis of right hip 07/13/2014   Past Medical History:  Diagnosis Date  . Arthritis   . Hyperlipemia   .  Hypertension     Past Surgical History:  Procedure Laterality Date  . arm surgery Left    plate in left arm  . CYST EXCISION Left    knee   . REPLACEMENT TOTAL HIP W/  RESURFACING IMPLANTS Bilateral     No current facility-administered medications for this encounter.   Current Outpatient Medications  Medication Sig Dispense Refill Last Dose  . Adalimumab (HUMIRA) 40 MG/0.4ML PSKT Inject 40 mg into the skin every 14 (fourteen) days.      . bimatoprost (LUMIGAN) 0.03 % ophthalmic solution Place 1 drop into both eyes nightly.     . COMBIGAN 0.2-0.5 % ophthalmic solution Apply 1 drop to eye 2 (two) times daily.     . Diclofenac Sodium 2 % SOLN Place 2 g onto the skin 2 (two) times daily. 112 g 3   . losartan-hydrochlorothiazide (HYZAAR) 100-12.5 MG tablet Take 1 tablet by mouth daily.     . Multiple Vitamin (MULTIVITAMIN PO) Take by mouth daily.     . mycophenolate (CELLCEPT) 500 MG tablet Take 1,500 mg by mouth 2 (two) times daily.      [name] PENNSAID 2 % SOLN APPLY 2 PUMPS TOPICALLY TO THE AFFECTED AREA(S) TWICE DAILY AS DIRECTED 112 g 3   . polyethylene glycol powder (GLYCOLAX/MIRALAX) 17 GM/SCOOP powder Take by mouth.     . rosuvastatin (CRESTOR) 20 MG tablet Take 20 mg by mouth daily.     . tacrolimus (PROGRAF) 1 MG capsule Take 1 mg by mouth 2 (two) times daily.     [name] tiZANidine (ZANAFLEX) 4 MG tablet Take 1 tablet (4 mg total) by mouth at bedtime. 30 tablet 1  Allergies  Allergen Reactions  . Gabapentin Nausea And Vomiting  . Penicillins Rash    Social History   Tobacco Use  . Smoking status: Never Smoker  . Smokeless tobacco: Never Used  Substance Use Topics  . Alcohol use: Never    Alcohol/week: 0.0 standard drinks    Family History  Problem Relation Age of Onset  . Heart disease Mother   . Healthy Brother      Review of Systems  Musculoskeletal: Positive for gait problem and joint swelling.  All other systems reviewed and are negative.   Objective:  Physical  Exam Vitals reviewed.  Constitutional:      Appearance: Normal appearance.  HENT:     Head: Normocephalic and atraumatic.  Eyes:     Extraocular Movements: Extraocular movements intact.     Pupils: Pupils are equal, round, and reactive to light.  Cardiovascular:     Rate and Rhythm: Normal rate.     Pulses: Normal pulses.  Pulmonary:     Effort: Pulmonary effort is normal.  Abdominal:     Palpations: Abdomen is soft.  Musculoskeletal:     Cervical back: Normal range of motion.     Left knee: Effusion present. Decreased range of motion. Tenderness present over the medial joint line and lateral joint line. Abnormal alignment and abnormal meniscus.  Neurological:     Mental Status: She is alert and oriented to person, place, and time.  Psychiatric:        Behavior: Behavior normal.     Vital signs in last 24 hours:    Labs:   Estimated body mass index is 40.92 kg/m as calculated from the following:   Height as of 07/17/20: 5\' 3"  (1.6 m).   Weight as of 07/04/20: 104.8 kg.   Imaging Review Plain radiographs demonstrate severe degenerative joint disease of the left knee(s). The overall alignment ismild valgus. The bone quality appears to be good for age and reported activity level.      Assessment/Plan:  End stage arthritis, left knee   The patient history, physical examination, clinical judgment of the provider and imaging studies are consistent with end stage degenerative joint disease of the left knee(s) and total knee arthroplasty is deemed medically necessary. The treatment options including medical management, injection therapy arthroscopy and arthroplasty were discussed at length. The risks and benefits of total knee arthroplasty were presented and reviewed. The risks due to aseptic loosening, infection, stiffness, patella tracking problems, thromboembolic complications and other imponderables were discussed. The patient acknowledged the explanation, agreed to  proceed with the plan and consent was signed. Patient is being admitted for inpatient treatment for surgery, pain control, PT, OT, prophylactic antibiotics, VTE prophylaxis, progressive ambulation and ADL's and discharge planning. The patient is planning to be discharged home with home health services

## 2020-07-27 ENCOUNTER — Other Ambulatory Visit: Payer: Self-pay

## 2020-07-27 ENCOUNTER — Ambulatory Visit (HOSPITAL_COMMUNITY): Payer: 59 | Admitting: Certified Registered"

## 2020-07-27 ENCOUNTER — Encounter (HOSPITAL_COMMUNITY): Payer: Self-pay | Admitting: Orthopaedic Surgery

## 2020-07-27 ENCOUNTER — Inpatient Hospital Stay (HOSPITAL_COMMUNITY)
Admission: AD | Admit: 2020-07-27 | Discharge: 2020-07-29 | DRG: 470 | Disposition: A | Discharge status: Home Health | Payer: 59 | Attending: Orthopaedic Surgery | Admitting: Orthopaedic Surgery

## 2020-07-27 ENCOUNTER — Ambulatory Visit (HOSPITAL_COMMUNITY): Payer: 59 | Admitting: Physician Assistant

## 2020-07-27 ENCOUNTER — Encounter (HOSPITAL_COMMUNITY)
Admission: AD | Disposition: A | Discharge status: Home Health | Payer: Self-pay | Source: Home / Self Care | Attending: Orthopaedic Surgery

## 2020-07-27 ENCOUNTER — Observation Stay (HOSPITAL_COMMUNITY): Payer: 59

## 2020-07-27 DIAGNOSIS — G8929 Other chronic pain: Secondary | ICD-10-CM | POA: Diagnosis present

## 2020-07-27 DIAGNOSIS — M1712 Unilateral primary osteoarthritis, left knee: Secondary | ICD-10-CM | POA: Diagnosis not present

## 2020-07-27 DIAGNOSIS — Z6841 Body Mass Index (BMI) 40.0 and over, adult: Secondary | ICD-10-CM

## 2020-07-27 DIAGNOSIS — Z96652 Presence of left artificial knee joint: Secondary | ICD-10-CM

## 2020-07-27 DIAGNOSIS — Z79899 Other long term (current) drug therapy: Secondary | ICD-10-CM

## 2020-07-27 DIAGNOSIS — I10 Essential (primary) hypertension: Secondary | ICD-10-CM | POA: Diagnosis present

## 2020-07-27 DIAGNOSIS — E785 Hyperlipidemia, unspecified: Secondary | ICD-10-CM | POA: Diagnosis present

## 2020-07-27 DIAGNOSIS — Z20822 Contact with and (suspected) exposure to covid-19: Secondary | ICD-10-CM | POA: Diagnosis present

## 2020-07-27 HISTORY — PX: TOTAL KNEE ARTHROPLASTY: SHX125

## 2020-07-27 LAB — TYPE AND SCREEN
ABO/RH(D): O POS
Antibody Screen: NEGATIVE

## 2020-07-27 LAB — ABO/RH: ABO/RH(D): O POS

## 2020-07-27 IMAGING — DX DG KNEE 1-2V*L*
2 series · 2 of 2 positions shown · non-contrast
Comparison: [DATE]

CLINICAL DATA: Knee replacement.

EXAM:
LEFT KNEE - 1-2 VIEW

[knee ap]
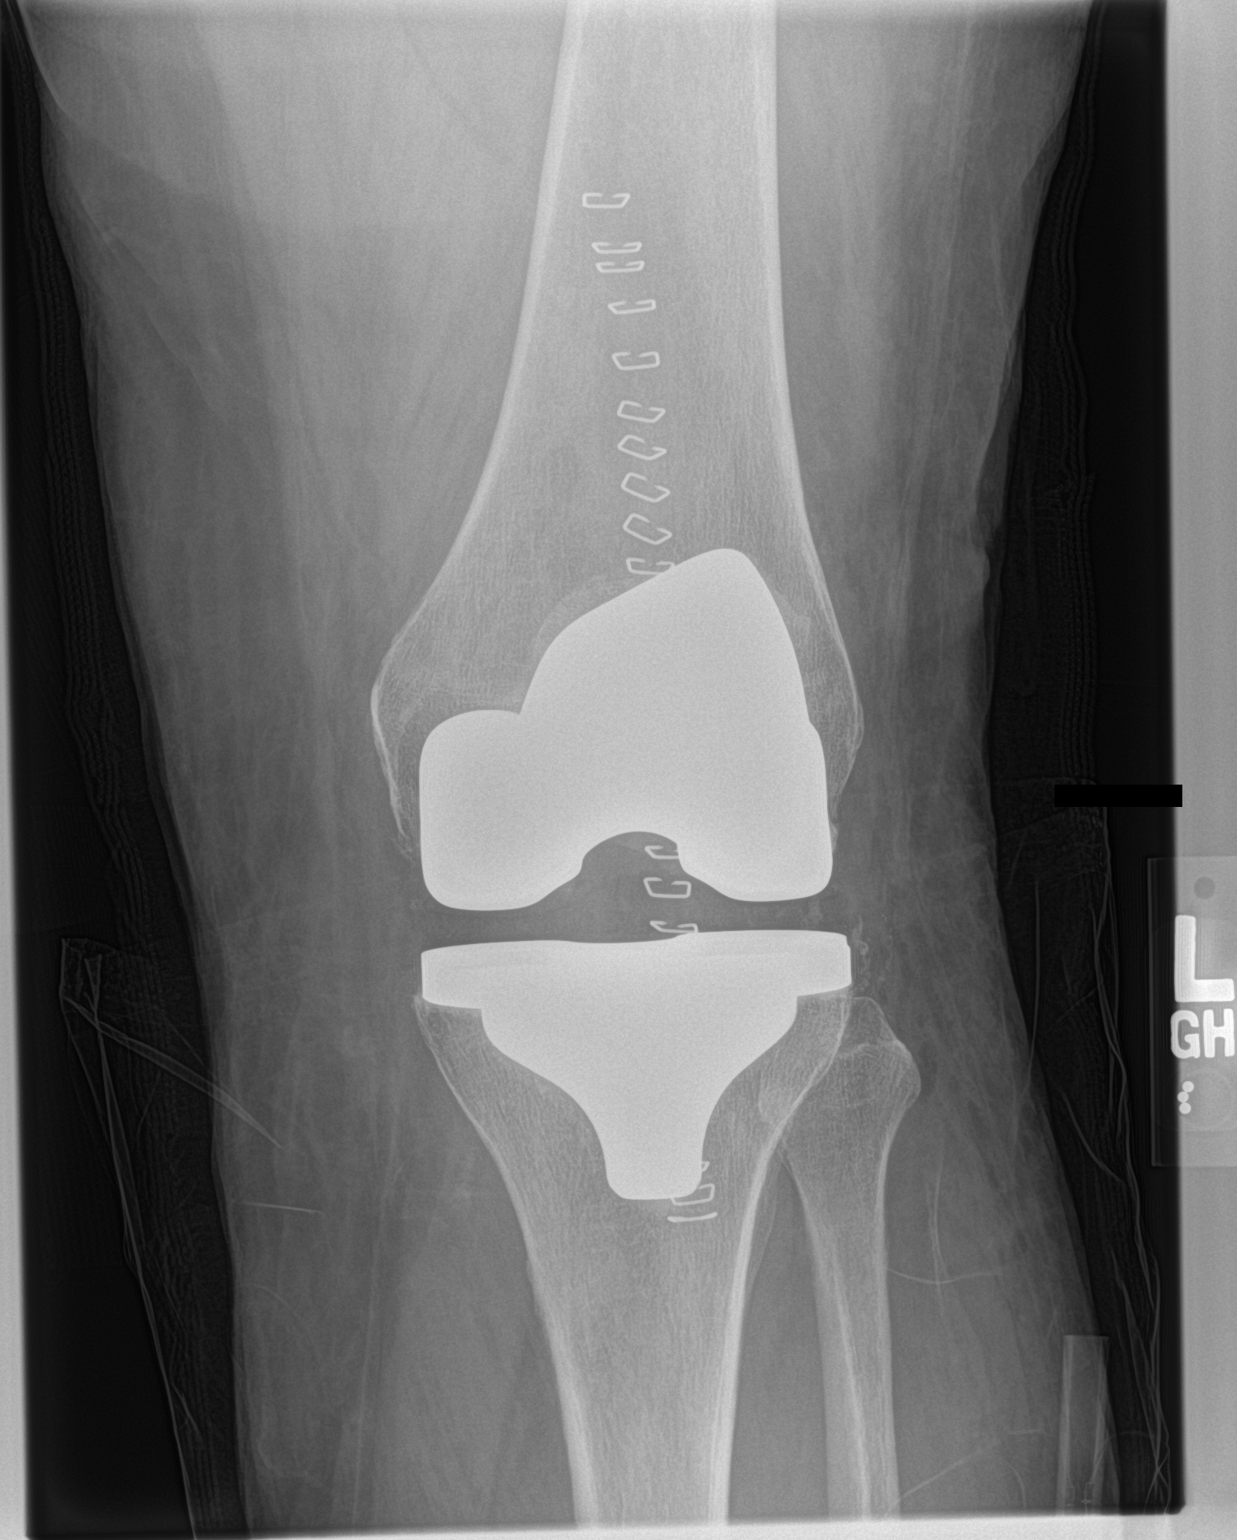

[knee lat]
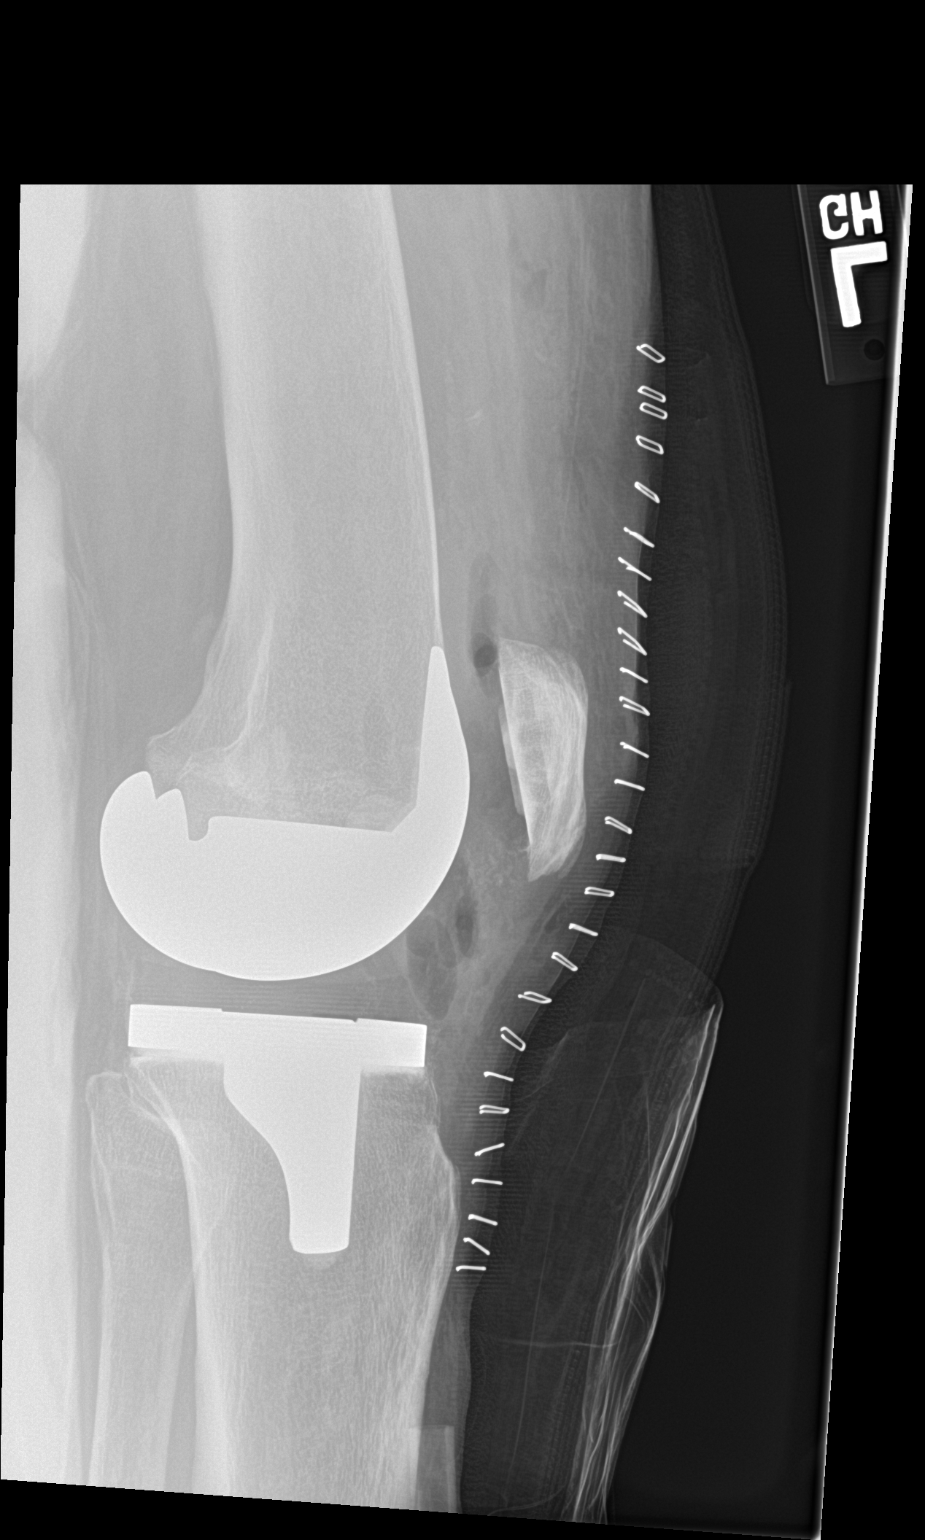

[2 of 2 positions shown; findings below may reference images not displayed]

FINDINGS: Interval right total knee replacement. No evidence for immediate
hardware complication. Gas in the soft tissues compatible with the
immediate postoperative state.
IMPRESSION: Tricompartmental knee replacement without complicating features.

## 2020-07-27 SURGERY — ARTHROPLASTY, KNEE, TOTAL
Anesthesia: Spinal | Site: Knee | Laterality: Left

## 2020-07-27 MED ORDER — CLINDAMYCIN PHOSPHATE 900 MG/50ML IV SOLN
900.0000 mg | INTRAVENOUS | Status: AC
  Administered 2020-07-27: 900 mg via INTRAVENOUS
  Filled 2020-07-27: qty 50

## 2020-07-27 MED ORDER — PROPOFOL 1000 MG/100ML IV EMUL
INTRAVENOUS | Status: AC
  Filled 2020-07-27: qty 100

## 2020-07-27 MED ORDER — METHOCARBAMOL 500 MG PO TABS
500.0000 mg | ORAL_TABLET | Freq: Four times a day (QID) | ORAL | Status: DC | PRN
  Administered 2020-07-27 – 2020-07-29 (×4): 500 mg via ORAL
  Filled 2020-07-27 (×4): qty 1

## 2020-07-27 MED ORDER — PROPOFOL 500 MG/50ML IV EMUL
INTRAVENOUS | Status: DC | PRN
  Administered 2020-07-27: 75 ug/kg/min via INTRAVENOUS

## 2020-07-27 MED ORDER — CLINDAMYCIN PHOSPHATE 600 MG/50ML IV SOLN
600.0000 mg | Freq: Four times a day (QID) | INTRAVENOUS | Status: AC
  Administered 2020-07-27 (×2): 600 mg via INTRAVENOUS
  Filled 2020-07-27 (×2): qty 50

## 2020-07-27 MED ORDER — ORAL CARE MOUTH RINSE: 15.0000 mL | Freq: Once | OROMUCOSAL | Status: AC

## 2020-07-27 MED ORDER — PHENYLEPHRINE HCL-NACL 10-0.9 MG/250ML-% IV SOLN
INTRAVENOUS | Status: DC | PRN
  Administered 2020-07-27: 25 ug/min via INTRAVENOUS

## 2020-07-27 MED ORDER — TACROLIMUS 1 MG PO CAPS
1.0000 mg | ORAL_CAPSULE | Freq: Two times a day (BID) | ORAL | Status: DC
  Administered 2020-07-27 – 2020-07-28 (×2): 1 mg via ORAL
  Filled 2020-07-27 (×2): qty 1

## 2020-07-27 MED ORDER — DOCUSATE SODIUM 100 MG PO CAPS
100.0000 mg | ORAL_CAPSULE | Freq: Two times a day (BID) | ORAL | Status: DC
  Administered 2020-07-27 – 2020-07-29 (×4): 100 mg via ORAL
  Filled 2020-07-27 (×4): qty 1

## 2020-07-27 MED ORDER — PANTOPRAZOLE SODIUM 40 MG PO TBEC: 40.0000 mg | DELAYED_RELEASE_TABLET | Freq: Every day | ORAL | Status: DC

## 2020-07-27 MED ORDER — ONDANSETRON HCL 4 MG/2ML IJ SOLN
INTRAMUSCULAR | Status: AC
  Filled 2020-07-27: qty 2

## 2020-07-27 MED ORDER — MIDAZOLAM HCL 2 MG/2ML IJ SOLN
1.0000 mg | Freq: Once | INTRAMUSCULAR | Status: AC
  Administered 2020-07-27: 1 mg via INTRAVENOUS
  Filled 2020-07-27: qty 2

## 2020-07-27 MED ORDER — OXYCODONE HCL 5 MG PO TABS: 5.0000 mg | ORAL_TABLET | Freq: Once | ORAL | Status: AC | PRN

## 2020-07-27 MED ORDER — ONDANSETRON HCL 4 MG/2ML IJ SOLN: 4.0000 mg | Freq: Once | INTRAMUSCULAR | Status: DC | PRN

## 2020-07-27 MED ORDER — LATANOPROST 0.005 % OP SOLN
1.0000 [drp] | Freq: Every day | OPHTHALMIC | Status: DC
  Administered 2020-07-27 – 2020-07-28 (×2): 1 [drp] via OPHTHALMIC
  Filled 2020-07-27: qty 2.5

## 2020-07-27 MED ORDER — ONDANSETRON HCL 4 MG PO TABS: 4.0000 mg | ORAL_TABLET | Freq: Four times a day (QID) | ORAL | Status: DC | PRN

## 2020-07-27 MED ORDER — BRIMONIDINE TARTRATE 0.2 % OP SOLN
1.0000 [drp] | Freq: Two times a day (BID) | OPHTHALMIC | Status: DC
  Administered 2020-07-27 – 2020-07-29 (×4): 1 [drp] via OPHTHALMIC
  Filled 2020-07-27: qty 5

## 2020-07-27 MED ORDER — FENTANYL CITRATE (PF) 100 MCG/2ML IJ SOLN
INTRAMUSCULAR | Status: AC
  Administered 2020-07-27: 50 ug via INTRAVENOUS
  Filled 2020-07-27: qty 4

## 2020-07-27 MED ORDER — CHLORHEXIDINE GLUCONATE 0.12 % MT SOLN
15.0000 mL | Freq: Once | OROMUCOSAL | Status: AC
  Administered 2020-07-27: 15 mL via OROMUCOSAL

## 2020-07-27 MED ORDER — 0.9 % SODIUM CHLORIDE (POUR BTL) OPTIME
TOPICAL | Status: DC | PRN
  Administered 2020-07-27: 1000 mL

## 2020-07-27 MED ORDER — METHOCARBAMOL 500 MG IVPB - SIMPLE MED
INTRAVENOUS | Status: AC
  Administered 2020-07-27: 500 mg via INTRAVENOUS
  Filled 2020-07-27: qty 50

## 2020-07-27 MED ORDER — OXYCODONE HCL 5 MG PO TABS
5.0000 mg | ORAL_TABLET | ORAL | Status: DC | PRN
  Administered 2020-07-27 – 2020-07-29 (×4): 10 mg via ORAL
  Filled 2020-07-27 (×4): qty 2

## 2020-07-27 MED ORDER — POVIDONE-IODINE 10 % EX SWAB
2.0000 "application " | Freq: Once | CUTANEOUS | Status: AC
  Administered 2020-07-27: 2 via TOPICAL

## 2020-07-27 MED ORDER — OXYCODONE HCL 5 MG/5ML PO SOLN: 5.0000 mg | Freq: Once | ORAL | Status: AC | PRN

## 2020-07-27 MED ORDER — TIMOLOL MALEATE 0.5 % OP SOLN
1.0000 [drp] | Freq: Two times a day (BID) | OPHTHALMIC | Status: DC
  Administered 2020-07-27 – 2020-07-29 (×4): 1 [drp] via OPHTHALMIC
  Filled 2020-07-27: qty 5

## 2020-07-27 MED ORDER — BUPIVACAINE-EPINEPHRINE 0.25% -1:200000 IJ SOLN
INTRAMUSCULAR | Status: DC | PRN
  Administered 2020-07-27: 30 mL

## 2020-07-27 MED ORDER — HYDROMORPHONE HCL 1 MG/ML IJ SOLN
0.5000 mg | INTRAMUSCULAR | Status: DC | PRN
  Administered 2020-07-28: 0.5 mg via INTRAVENOUS
  Filled 2020-07-27: qty 1

## 2020-07-27 MED ORDER — OXYCODONE HCL 5 MG PO TABS
10.0000 mg | ORAL_TABLET | ORAL | Status: DC | PRN
  Administered 2020-07-27 – 2020-07-28 (×5): 15 mg via ORAL
  Filled 2020-07-27 (×5): qty 3

## 2020-07-27 MED ORDER — TRANEXAMIC ACID-NACL 1000-0.7 MG/100ML-% IV SOLN
1000.0000 mg | INTRAVENOUS | Status: AC
  Administered 2020-07-27: 1000 mg via INTRAVENOUS
  Filled 2020-07-27: qty 100

## 2020-07-27 MED ORDER — FENTANYL CITRATE (PF) 100 MCG/2ML IJ SOLN
25.0000 ug | INTRAMUSCULAR | Status: DC | PRN
  Administered 2020-07-27 (×2): 50 ug via INTRAVENOUS

## 2020-07-27 MED ORDER — SODIUM CHLORIDE 0.9 % IV SOLN: INTRAVENOUS | Status: DC

## 2020-07-27 MED ORDER — FENTANYL CITRATE (PF) 100 MCG/2ML IJ SOLN
50.0000 ug | Freq: Once | INTRAMUSCULAR | Status: AC
  Administered 2020-07-27: 50 ug via INTRAVENOUS

## 2020-07-27 MED ORDER — MYCOPHENOLATE MOFETIL 250 MG PO CAPS
1500.0000 mg | ORAL_CAPSULE | Freq: Two times a day (BID) | ORAL | Status: DC
  Administered 2020-07-27 – 2020-07-29 (×4): 1500 mg via ORAL
  Filled 2020-07-27 (×4): qty 6

## 2020-07-27 MED ORDER — LOSARTAN POTASSIUM 50 MG PO TABS
100.0000 mg | ORAL_TABLET | Freq: Every day | ORAL | Status: DC
  Administered 2020-07-27 – 2020-07-29 (×3): 100 mg via ORAL
  Filled 2020-07-27 (×3): qty 2

## 2020-07-27 MED ORDER — PHENOL 1.4 % MT LIQD: 1.0000 | OROMUCOSAL | Status: DC | PRN

## 2020-07-27 MED ORDER — ASPIRIN 81 MG PO CHEW
81.0000 mg | CHEWABLE_TABLET | Freq: Two times a day (BID) | ORAL | Status: DC
  Administered 2020-07-27 – 2020-07-29 (×4): 81 mg via ORAL
  Filled 2020-07-27 (×4): qty 1

## 2020-07-27 MED ORDER — ONDANSETRON HCL 4 MG/2ML IJ SOLN
4.0000 mg | Freq: Four times a day (QID) | INTRAMUSCULAR | Status: DC | PRN
  Administered 2020-07-28: 4 mg via INTRAVENOUS
  Filled 2020-07-27 (×2): qty 2

## 2020-07-27 MED ORDER — METHOCARBAMOL 500 MG IVPB - SIMPLE MED
500.0000 mg | Freq: Four times a day (QID) | INTRAVENOUS | Status: DC | PRN
  Filled 2020-07-27: qty 50

## 2020-07-27 MED ORDER — SODIUM CHLORIDE 0.9 % IR SOLN
Status: DC | PRN
  Administered 2020-07-27: 1

## 2020-07-27 MED ORDER — LACTATED RINGERS IV SOLN: INTRAVENOUS | Status: DC

## 2020-07-27 MED ORDER — LOSARTAN POTASSIUM-HCTZ 100-12.5 MG PO TABS: 1.0000 | ORAL_TABLET | Freq: Every day | ORAL | Status: DC

## 2020-07-27 MED ORDER — DIPHENHYDRAMINE HCL 12.5 MG/5ML PO ELIX: 12.5000 mg | ORAL_SOLUTION | ORAL | Status: DC | PRN

## 2020-07-27 MED ORDER — POLYETHYLENE GLYCOL 3350 17 G PO PACK: 17.0000 g | PACK | Freq: Every day | ORAL | Status: DC | PRN

## 2020-07-27 MED ORDER — ONDANSETRON HCL 4 MG/2ML IJ SOLN
INTRAMUSCULAR | Status: DC | PRN
  Administered 2020-07-27: 4 mg via INTRAVENOUS

## 2020-07-27 MED ORDER — BUPIVACAINE IN DEXTROSE 0.75-8.25 % IT SOLN
INTRATHECAL | Status: DC | PRN
  Administered 2020-07-27: 1.6 mg via INTRATHECAL

## 2020-07-27 MED ORDER — PROPOFOL 10 MG/ML IV BOLUS
INTRAVENOUS | Status: DC | PRN
  Administered 2020-07-27: 10 mg via INTRAVENOUS

## 2020-07-27 MED ORDER — STERILE WATER FOR IRRIGATION IR SOLN
Status: DC | PRN
  Administered 2020-07-27: 1000 mL

## 2020-07-27 MED ORDER — ROSUVASTATIN CALCIUM 20 MG PO TABS
20.0000 mg | ORAL_TABLET | Freq: Every day | ORAL | Status: DC
  Administered 2020-07-27 – 2020-07-29 (×3): 20 mg via ORAL
  Filled 2020-07-27 (×3): qty 1

## 2020-07-27 MED ORDER — ACETAMINOPHEN 325 MG PO TABS
325.0000 mg | ORAL_TABLET | Freq: Four times a day (QID) | ORAL | Status: DC | PRN
  Administered 2020-07-27 – 2020-07-29 (×2): 650 mg via ORAL
  Filled 2020-07-27 (×2): qty 2

## 2020-07-27 MED ORDER — BUPIVACAINE-EPINEPHRINE (PF) 0.25% -1:200000 IJ SOLN
INTRAMUSCULAR | Status: AC
  Filled 2020-07-27: qty 30

## 2020-07-27 MED ORDER — OXYCODONE HCL 5 MG PO TABS
ORAL_TABLET | ORAL | Status: AC
  Administered 2020-07-27: 5 mg via ORAL
  Filled 2020-07-27: qty 1

## 2020-07-27 MED ORDER — MENTHOL 3 MG MT LOZG: 1.0000 | LOZENGE | OROMUCOSAL | Status: DC | PRN

## 2020-07-27 MED ORDER — ALUM & MAG HYDROXIDE-SIMETH 200-200-20 MG/5ML PO SUSP: 30.0000 mL | ORAL | Status: DC | PRN

## 2020-07-27 MED ORDER — METOCLOPRAMIDE HCL 5 MG/ML IJ SOLN
5.0000 mg | Freq: Three times a day (TID) | INTRAMUSCULAR | Status: DC | PRN
  Administered 2020-07-28: 10 mg via INTRAVENOUS
  Filled 2020-07-27: qty 2

## 2020-07-27 MED ORDER — BRIMONIDINE TARTRATE-TIMOLOL 0.2-0.5 % OP SOLN: 1.0000 [drp] | Freq: Two times a day (BID) | OPHTHALMIC | Status: DC

## 2020-07-27 MED ORDER — HYDROCHLOROTHIAZIDE 12.5 MG PO CAPS
12.5000 mg | ORAL_CAPSULE | Freq: Every day | ORAL | Status: DC
  Administered 2020-07-27 – 2020-07-29 (×3): 12.5 mg via ORAL
  Filled 2020-07-27 (×3): qty 1

## 2020-07-27 MED ORDER — METOCLOPRAMIDE HCL 5 MG PO TABS: 5.0000 mg | ORAL_TABLET | Freq: Three times a day (TID) | ORAL | Status: DC | PRN

## 2020-07-27 SURGICAL SUPPLY — 58 items
BAG ZIPLOCK 12X15 (MISCELLANEOUS) IMPLANT
BASEPLATE TIBIAL PRIMARY SZ4 (Plate) ×2 IMPLANT
BENZOIN TINCTURE PRP APPL 2/3 (GAUZE/BANDAGES/DRESSINGS) IMPLANT
BLADE SAG 18X100X1.27 (BLADE) IMPLANT
BLADE SURG SZ10 CARB STEEL (BLADE) ×4 IMPLANT
BNDG ELASTIC 6X10 VLCR STRL LF (GAUZE/BANDAGES/DRESSINGS) ×4 IMPLANT
BNDG ELASTIC 6X5.8 VLCR STR LF (GAUZE/BANDAGES/DRESSINGS) ×2 IMPLANT
BOWL SMART MIX CTS (DISPOSABLE) IMPLANT
CEMENT BONE SIMPLEX SPEEDSET (Cement) ×4 IMPLANT
CLSR STERI-STRIP ANTIMIC 1/2X4 (GAUZE/BANDAGES/DRESSINGS) ×4 IMPLANT
COVER SURGICAL LIGHT HANDLE (MISCELLANEOUS) ×2 IMPLANT
COVER WAND RF STERILE (DRAPES) IMPLANT
CUFF TOURN SGL QUICK 34 (TOURNIQUET CUFF) ×1
CUFF TRNQT CYL 34X4.125X (TOURNIQUET CUFF) ×1 IMPLANT
DECANTER SPIKE VIAL GLASS SM (MISCELLANEOUS) IMPLANT
DRAPE U-SHAPE 47X51 STRL (DRAPES) ×2 IMPLANT
DRSG PAD ABDOMINAL 8X10 ST (GAUZE/BANDAGES/DRESSINGS) ×4 IMPLANT
DURAPREP 26ML APPLICATOR (WOUND CARE) ×2 IMPLANT
ELECT BLADE TIP CTD 4 INCH (ELECTRODE) ×2 IMPLANT
ELECT REM PT RETURN 15FT ADLT (MISCELLANEOUS) ×2 IMPLANT
FEMORAL PEG DISTAL FIXATION (Orthopedic Implant) ×2 IMPLANT
FEMORAL POSTERIOR STAB SZ4 (Orthopedic Implant) ×2 IMPLANT
GAUZE SPONGE 4X4 12PLY STRL (GAUZE/BANDAGES/DRESSINGS) ×2 IMPLANT
GAUZE XEROFORM 1X8 LF (GAUZE/BANDAGES/DRESSINGS) ×2 IMPLANT
GLOVE BIO SURGEON STRL SZ7.5 (GLOVE) ×2 IMPLANT
GLOVE BIOGEL PI IND STRL 8 (GLOVE) ×2 IMPLANT
GLOVE BIOGEL PI INDICATOR 8 (GLOVE) ×2
GLOVE ECLIPSE 8.0 STRL XLNG CF (GLOVE) ×2 IMPLANT
GOWN STRL REUS W/TWL XL LVL3 (GOWN DISPOSABLE) ×4 IMPLANT
HANDPIECE INTERPULSE COAX TIP (DISPOSABLE) ×1
HOLDER FOLEY CATH W/STRAP (MISCELLANEOUS) IMPLANT
IMMOBILIZER KNEE 20 (SOFTGOODS) ×2
IMMOBILIZER KNEE 20 THIGH 36 (SOFTGOODS) ×1 IMPLANT
INSERT TIB 4 13XPOST STAB BRNG (Insert) ×1 IMPLANT
INSERT TIB TRIATH 4X13 (Insert) ×1 IMPLANT
INSRT TIB 4 13XPOST STAB BRNG (Insert) ×1 IMPLANT
KIT TURNOVER KIT A (KITS) IMPLANT
NS IRRIG 1000ML POUR BTL (IV SOLUTION) ×2 IMPLANT
PACK TOTAL KNEE CUSTOM (KITS) ×2 IMPLANT
PADDING CAST ABS 6INX4YD NS (CAST SUPPLIES) ×2
PADDING CAST ABS COTTON 6X4 NS (CAST SUPPLIES) ×2 IMPLANT
PADDING CAST COTTON 6X4 STRL (CAST SUPPLIES) ×4 IMPLANT
PATELLA 32MMX10MM (Knees) ×2 IMPLANT
PENCIL SMOKE EVACUATOR (MISCELLANEOUS) IMPLANT
PIN FLUTED HEDLESS FIX 3.5X1/8 (PIN) ×2 IMPLANT
PROTECTOR NERVE ULNAR (MISCELLANEOUS) ×2 IMPLANT
SET HNDPC FAN SPRY TIP SCT (DISPOSABLE) ×1 IMPLANT
SET PAD KNEE POSITIONER (MISCELLANEOUS) ×2 IMPLANT
STAPLER VISISTAT 35W (STAPLE) IMPLANT
STRIP CLOSURE SKIN 1/2X4 (GAUZE/BANDAGES/DRESSINGS) IMPLANT
SUT MNCRL AB 4-0 PS2 18 (SUTURE) IMPLANT
SUT VIC AB 0 CT1 27 (SUTURE) ×1
SUT VIC AB 0 CT1 27XBRD ANTBC (SUTURE) ×1 IMPLANT
SUT VIC AB 1 CT1 36 (SUTURE) ×4 IMPLANT
SUT VIC AB 2-0 CT1 27 (SUTURE) ×2
SUT VIC AB 2-0 CT1 TAPERPNT 27 (SUTURE) ×2 IMPLANT
TRAY FOLEY MTR SLVR 16FR STAT (SET/KITS/TRAYS/PACK) ×2 IMPLANT
WATER STERILE IRR 1000ML POUR (IV SOLUTION) ×2 IMPLANT

## 2020-07-27 NOTE — Transfer of Care (Signed)
Immediate Anesthesia Transfer of Care Note  Patient: Olivia Barnes  Procedure(s) Performed: LEFT TOTAL KNEE ARTHROPLASTY (Left Knee)  Patient Location: PACU  Anesthesia Type:Spinal  Level of Consciousness: awake, alert  and oriented  Airway & Oxygen Therapy: Patient Spontanous Breathing and Patient connected to face mask oxygen  Post-op Assessment: Report given to RN and Post -op Vital signs reviewed and stable  Post vital signs: Reviewed and stable  Last Vitals:  Vitals Value Taken Time  BP    Temp    Pulse 57 07/27/20 1226  Resp 10 07/27/20 1226  SpO2 100 % 07/27/20 1226  Vitals shown include unvalidated device data.  Last Pain:  Vitals:   07/27/20 0905  TempSrc:   PainSc: 0-No pain         Complications: No complications documented.

## 2020-07-27 NOTE — Interval H&P Note (Signed)
History and Physical Interval Note: The patient understands that she is here today for a left total knee arthroplasty to treat the pain from her left knee osteoarthritis.  There has been no interval change in her medical status.  The risk and benefits of been described in detail of left knee replacement surgery.  See recent H&P.  Informed consent is obtained and the left knee has been marked.  07/27/2020 9:13 AM  Olivia Barnes  has presented today for surgery, with the diagnosis of Osteoarthritis Left Knee.  The various methods of treatment have been discussed with the patient and family. After consideration of risks, benefits and other options for treatment, the patient has consented to  Procedure(s): LEFT TOTAL KNEE ARTHROPLASTY (Left) as a surgical intervention.  The patient's history has been reviewed, patient examined, no change in status, stable for surgery.  I have reviewed the patient's chart and labs.  Questions were answered to the patient's satisfaction.     Kathryne Hitch

## 2020-07-27 NOTE — Brief Op Note (Signed)
07/27/2020  12:04 PM  PATIENT:  Alfredo Batty  64 y.o. female  PRE-OPERATIVE DIAGNOSIS:  Osteoarthritis Left Knee  POST-OPERATIVE DIAGNOSIS:  Osteoarthritis Left Knee  PROCEDURE:  Procedure(s): LEFT TOTAL KNEE ARTHROPLASTY (Left)  SURGEON:  Surgeon(s) and Role:    Kathryne Hitch, MD - Primary  PHYSICIAN ASSISTANT:  Rexene Edison, PA-C  ANESTHESIA:   local, regional and spinal  EBL:  50 mL   COUNTS:  YES  TOURNIQUET:   Total Tourniquet Time Documented: Thigh (Left) - 48 minutes Total: Thigh (Left) - 48 minutes   DICTATION: .Other Dictation: Dictation Number [id number]  PLAN OF CARE: Admit for overnight observation  PATIENT DISPOSITION:  PACU - hemodynamically stable.   Delay start of Pharmacological VTE agent (>24hrs) due to surgical blood loss or risk of bleeding: no

## 2020-07-27 NOTE — Anesthesia Preprocedure Evaluation (Signed)
Anesthesia Evaluation  Patient identified by MRN, date of birth, ID band Patient awake    Reviewed: Allergy & Precautions, NPO status , Patient's Chart, lab work & pertinent test results  Airway Mallampati: II  TM Distance: >3 FB Neck ROM: Full    Dental  (+) Teeth Intact   Pulmonary    breath sounds clear to auscultation       Cardiovascular hypertension,  Rhythm:Regular Rate:Normal     Neuro/Psych    GI/Hepatic   Endo/Other    Renal/GU      Musculoskeletal   Abdominal   Peds  Hematology   Anesthesia Other Findings   Reproductive/Obstetrics                             Anesthesia Physical Anesthesia Plan  ASA: III  Anesthesia Plan: Spinal   Post-op Pain Management:  Regional for Post-op pain   Induction:   PONV Risk Score and Plan: Ondansetron and Dexamethasone  Airway Management Planned: Natural Airway and Simple Face Mask  Additional Equipment:   Intra-op Plan:   Post-operative Plan:   Informed Consent: I have reviewed the patients History and Physical, chart, labs and discussed the procedure including the risks, benefits and alternatives for the proposed anesthesia with the patient or authorized representative who has indicated his/her understanding and acceptance.       Plan Discussed with: CRNA and Anesthesiologist  Anesthesia Plan Comments:         Anesthesia Quick Evaluation

## 2020-07-27 NOTE — Anesthesia Postprocedure Evaluation (Signed)
Anesthesia Post Note  Patient: Olivia Barnes  Procedure(s) Performed: LEFT TOTAL KNEE ARTHROPLASTY (Left Knee)     Patient location during evaluation: PACU Anesthesia Type: Spinal Level of consciousness: awake and alert Pain management: pain level controlled Vital Signs Assessment: post-procedure vital signs reviewed and stable Respiratory status: spontaneous breathing, nonlabored ventilation, respiratory function stable and patient connected to nasal cannula oxygen Cardiovascular status: blood pressure returned to baseline and stable Postop Assessment: no apparent nausea or vomiting Anesthetic complications: no   No complications documented.  Last Vitals:  Vitals:   07/27/20 1559 07/27/20 1711  BP: (!) 163/71 (!) 145/61  Pulse: (!) 57 (!) 58  Resp: 14 14  Temp: 36.9 C 36.9 C  SpO2: 100% 97%    Last Pain:  Vitals:   07/27/20 1758  TempSrc:   PainSc: 8                  Newel Oien COKER

## 2020-07-27 NOTE — Anesthesia Procedure Notes (Addendum)
Spinal  Patient location during procedure: OR Start time: 07/27/2020 10:25 AM End time: 07/27/2020 10:30 AM Staffing Performed: anesthesiologist  Anesthesiologist: Kipp Brood, MD Preanesthetic Checklist Completed: patient identified, IV checked, site marked, risks and benefits discussed, surgical consent, monitors and equipment checked, pre-op evaluation and timeout performed Spinal Block Patient position: sitting Prep: DuraPrep Patient monitoring: heart rate, cardiac monitor, continuous pulse ox and blood pressure Approach: midline Location: L3-4 Injection technique: single-shot Needle Needle type: Sprotte and Tuohy  Needle gauge: 22 G Needle length: 9 cm Assessment Sensory level: T4 Additional Notes 1.6 cc 0.75% Bupivacaine injected easily  R. Paramedian approach blood tinged CSF

## 2020-07-27 NOTE — Evaluation (Signed)
Physical Therapy Evaluation Patient Details Name: Olivia Barnes MRN: [id number] DOB: 05-08-1956 Today's Date: 07/27/2020   History of Present Illness  Pt s/p L TKR and with hx of bil THR  Clinical Impression  Pt s/p L TKR and presents with decreased L LE strength/ROM and post op pain limiting functional mobility.  Pt should progress to dc home with family assist.    Follow Up Recommendations Home health PT;Follow surgeon's recommendation for DC plan and follow-up therapies    Equipment Recommendations  None recommended by PT    Recommendations for Other Services       Precautions / Restrictions Precautions Precautions: Knee;Fall Required Braces or Orthoses: Knee Immobilizer - Left Knee Immobilizer - Left: Discontinue once straight leg raise with < 10 degree lag Restrictions Weight Bearing Restrictions: No LLE Weight Bearing: Weight bearing as tolerated      Mobility  Bed Mobility Overal bed mobility: Needs Assistance Bed Mobility: Supine to Sit     Supine to sit: Min assist;Mod assist     General bed mobility comments: cues for sequence and use of R LE to self assist;  Physical assist to manage L LE and to bring trunk to upright    Transfers Overall transfer level: Needs assistance Equipment used: Rolling walker (2 wheeled) Transfers: Sit to/from Stand Sit to Stand: Min assist;From elevated surface         General transfer comment: cues for LE management and use of UEs to self assist  Ambulation/Gait Ambulation/Gait assistance: Min assist Gait Distance (Feet): 21 Feet Assistive device: Rolling walker (2 wheeled) Gait Pattern/deviations: Step-to pattern;Decreased step length - right;Decreased step length - left;Shuffle;Trunk flexed Gait velocity: decr   General Gait Details: cues for sequence, posture and position from RW; distance ltd by nausea  Stairs            Wheelchair Mobility    Modified Rankin (Stroke Patients Only)       Balance  Overall balance assessment: Needs assistance Sitting-balance support: No upper extremity supported;Feet supported Sitting balance-Leahy Scale: Fair     Standing balance support: Bilateral upper extremity supported Standing balance-Leahy Scale: Poor                               Pertinent Vitals/Pain Pain Assessment: 0-10 Pain Score: 5  Pain Location: L knee Pain Descriptors / Indicators: Aching;Sore Pain Intervention(s): Limited activity within patient's tolerance;Monitored during session;Premedicated before session;Ice applied    Home Living Family/patient expects to be discharged to:: Private residence Living Arrangements: Spouse/significant other Available Help at Discharge: Family Type of Home: House Home Access: Stairs to enter Entrance Stairs-Rails: None Entrance Stairs-Number of Steps: 1+1 Home Layout: One level Home Equipment: Walker - standard;Cane - single point;Bedside commode      Prior Function Level of Independence: Independent               Hand Dominance        Extremity/Trunk Assessment   Upper Extremity Assessment Upper Extremity Assessment: Overall WFL for tasks assessed    Lower Extremity Assessment Lower Extremity Assessment: LLE deficits/detail    Cervical / Trunk Assessment Cervical / Trunk Assessment: Normal  Communication   Communication: No difficulties  Cognition Arousal/Alertness: Awake/alert Behavior During Therapy: WFL for tasks assessed/performed Overall Cognitive Status: Within Functional Limits for tasks assessed  General Comments      Exercises Total Joint Exercises Ankle Circles/Pumps: AROM;15 reps;Supine;Both   Assessment/Plan    PT Assessment Patient needs continued PT services  PT Problem List Decreased strength;Decreased range of motion;Decreased activity tolerance;Decreased balance;Decreased mobility;Decreased knowledge of use of  DME;Obesity;Pain       PT Treatment Interventions DME instruction;Gait training;Stair training;Functional mobility training;Therapeutic activities;Therapeutic exercise;Patient/family education;Balance training    PT Goals (Current goals can be found in the Care Plan section)  Acute Rehab PT Goals Patient Stated Goal: Regain IND PT Goal Formulation: With patient Time For Goal Achievement: 08/10/20 Potential to Achieve Goals: Good    Frequency 7X/week   Barriers to discharge        Co-evaluation               AM-PAC PT "6 Clicks" Mobility  Outcome Measure Help needed turning from your back to your side while in a flat bed without using bedrails?: A Little Help needed moving from lying on your back to sitting on the side of a flat bed without using bedrails?: A Little Help needed moving to and from a bed to a chair (including a wheelchair)?: A Little Help needed standing up from a chair using your arms (e.g., wheelchair or bedside chair)?: A Little Help needed to walk in hospital room?: A Little Help needed climbing 3-5 steps with a railing? : A Lot 6 Click Score: 17    End of Session Equipment Utilized During Treatment: Gait belt;Left knee immobilizer Activity Tolerance: Patient tolerated treatment well Patient left: in chair;with call bell/phone within reach;with chair alarm set Nurse Communication: Mobility status PT Visit Diagnosis: Difficulty in walking, not elsewhere classified (R26.2)    Time: 9024-0973 PT Time Calculation (min) (ACUTE ONLY): 27 min   Charges:   PT Evaluation $PT Eval Low Complexity: 1 Low PT Treatments $Gait Training: 8-22 mins        Mauro Kaufmann PT Acute Rehabilitation Services Pager [phone] Office [phone]   Mellody Masri 07/27/2020, 4:51 PM

## 2020-07-27 NOTE — Anesthesia Procedure Notes (Signed)
Procedure Name: MAC Date/Time: 07/27/2020 10:19 AM Performed by: Maxwell Caul, CRNA Pre-anesthesia Checklist: Patient identified, Emergency Drugs available, Suction available and Patient being monitored Oxygen Delivery Method: Simple face mask

## 2020-07-27 NOTE — Progress Notes (Signed)
AssistedDr. Joslin with left, ultrasound guided, adductor canal block. Side rails up, monitors on throughout procedure. See vital signs in flow sheet. Tolerated Procedure well.  

## 2020-07-28 DIAGNOSIS — Z20822 Contact with and (suspected) exposure to covid-19: Secondary | ICD-10-CM | POA: Diagnosis present

## 2020-07-28 DIAGNOSIS — Z6841 Body Mass Index (BMI) 40.0 and over, adult: Secondary | ICD-10-CM | POA: Diagnosis not present

## 2020-07-28 DIAGNOSIS — G8929 Other chronic pain: Secondary | ICD-10-CM | POA: Diagnosis present

## 2020-07-28 DIAGNOSIS — Z79899 Other long term (current) drug therapy: Secondary | ICD-10-CM | POA: Diagnosis not present

## 2020-07-28 DIAGNOSIS — I10 Essential (primary) hypertension: Secondary | ICD-10-CM | POA: Diagnosis present

## 2020-07-28 DIAGNOSIS — M1712 Unilateral primary osteoarthritis, left knee: Secondary | ICD-10-CM | POA: Diagnosis present

## 2020-07-28 DIAGNOSIS — E785 Hyperlipidemia, unspecified: Secondary | ICD-10-CM | POA: Diagnosis present

## 2020-07-28 LAB — BASIC METABOLIC PANEL
Anion gap: 9 (ref 5–15)
BUN: 18 mg/dL (ref 8–23)
CO2: 23 mmol/L (ref 22–32)
Calcium: 8.5 mg/dL — ABNORMAL LOW (ref 8.9–10.3)
Chloride: 103 mmol/L (ref 98–111)
Creatinine, Ser: 0.63 mg/dL (ref 0.44–1.00)
GFR, Estimated: >60 mL/min (ref >60)
Glucose, Bld: 145 mg/dL — ABNORMAL HIGH (ref 70–99)
Potassium: 4.3 mmol/L (ref 3.5–5.1)
Sodium: 135 mmol/L (ref 135–145)

## 2020-07-28 LAB — CBC
HCT: 34.5 % — ABNORMAL LOW (ref 36.0–46.0)
Hemoglobin: 10.9 g/dL — ABNORMAL LOW (ref 12.0–15.0)
MCH: 29.3 pg (ref 26.0–34.0)
MCHC: 31.6 g/dL (ref 30.0–36.0)
MCV: 92.7 fL (ref 80.0–100.0)
Platelets: 140 10*3/uL — ABNORMAL LOW (ref 150–400)
RBC: 3.72 MIL/uL — ABNORMAL LOW (ref 3.87–5.11)
RDW: 13.6 % (ref 11.5–15.5)
WBC: 8.3 10*3/uL (ref 4.0–10.5)
nRBC: 0 % (ref 0.0–0.2)

## 2020-07-28 MED ORDER — ASPIRIN 81 MG PO CHEW
81.0000 mg | CHEWABLE_TABLET | Freq: Two times a day (BID) | ORAL | 0 refills | Status: DC
Start: 2020-07-28 — End: 2020-10-12

## 2020-07-28 MED ORDER — TACROLIMUS 1 MG PO CAPS
2.0000 mg | ORAL_CAPSULE | Freq: Two times a day (BID) | ORAL | Status: DC
  Administered 2020-07-28 – 2020-07-29 (×2): 2 mg via ORAL
  Filled 2020-07-28 (×2): qty 2

## 2020-07-28 MED ORDER — METHOCARBAMOL 500 MG PO TABS
500.0000 mg | ORAL_TABLET | Freq: Four times a day (QID) | ORAL | 1 refills | Status: DC | PRN
Start: 2020-07-28 — End: 2020-10-12

## 2020-07-28 MED ORDER — OXYCODONE HCL 5 MG PO TABS
5.0000 mg | ORAL_TABLET | ORAL | 0 refills | Status: DC | PRN
Start: 2020-07-28 — End: 2020-08-20

## 2020-07-28 NOTE — Plan of Care (Signed)
  Problem: Education: Goal: Knowledge of General Education information will improve Description: Including pain rating scale, medication(s)/side effects and non-pharmacologic comfort measures Outcome: Progressing   Problem: Health Behavior/Discharge Planning: Goal: Ability to manage health-related needs will improve Outcome: Progressing   Problem: Activity: Goal: Risk for activity intolerance will decrease Outcome: Progressing   Problem: Elimination: Goal: Will not experience complications related to urinary retention Outcome: Progressing   

## 2020-07-28 NOTE — Plan of Care (Signed)

## 2020-07-28 NOTE — Op Note (Signed)
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  POSTOPERATIVE DIAGNOSES:  Primary osteoarthritis and degenerative joint disease of left knee.  PROCEDURE:  Left total knee arthroplasty.  IMPLANTS:  Stryker Triathlon cemented knee system with size 4 femur, size 4 tibial tray, 13 mm thickness fixed bearing polyethylene liner, size 32 patellar button.  SURGEON:  Vanita Panda. Magnus Ivan, MD  ASSISTANT:  Richardean Canal, PA-C  ANESTHESIA: 1.  Left lower extremity adductor canal block. 2.  Spinal. 3.  Local with mixture of Marcaine with epinephrine.  TOURNIQUET TIME:  Under 1 hour.  ANTIBIOTICS:  900 mg IV clindamycin.  ESTIMATED BLOOD LOSS:  Less than 100 mL.  COMPLICATIONS:  None.  INDICATIONS:  The patient is a 64 year old female with chronic left knee pain.  She has tried and failed all forms of conservative treatment and has had multiple arthroscopic interventions elsewhere.  She has had multiple injections in her knee and at  this point, her left knee pain is daily and is detrimentally affecting her mobility, her quality of life and activities of daily living.  Plain films and MRI show significant cartilage loss in her knee.  At this point, with very conservative treatment,  she was wishing for a total knee arthroplasty.  She is someone who is morbidly obese with a BMI of 40.  We explained in detail the risk of acute blood loss anemia, nerve or vessel injury, fracture, infection, DVT, and implant failure which ____ and given  her obesity.  She understands our goals are to decrease pain, improve mobility and overall improve quality of life.  DESCRIPTION OF PROCEDURE:  After informed consent was obtained, appropriate left  knee was marked, anesthesia obtained an adductor canal block in the holding room of the left lower extremity.  She was then brought to the operating room and sat up on the  operating table.  Spinal anesthesia was obtained.  She was placed in supine position on the operating table.  Foley catheter was placed and a nonsterile tourniquet was placed around her upper left thigh.  Her left thigh, knee, leg, ankle and foot were  prepped and draped with DuraPrep and sterile drapes including a sterile stockinette.  Time-out was called.  She was identified as correct patient, correct left knee.  I then wrapped out the leg with the Esmarch and the tourniquet was inflated to 300 mm  of pressure.  I then made a direct midline incision over the patella and carried this proximally and distally.  I dissected down the knee joint and carried out a medial parapatellar arthrotomy, finding a moderate joint effusion and significant  periarticular osteophytes and cartilage loss in the knee.  With the knee in a flexed position, I removed the medial and lateral meniscus as well as ACL and PCL.  We used extramedullary guide for making our proximal tibia cut, correcting for varus and  valgus and neutral slope.  We set this cut to take 9 mm off the high side.  We did this without difficulty.  We then used an intramedullary guide for the femur and made our distal femoral cut at 10 mm distal femoral cut using a left distal femoral  cutting block set for a left knee at 5 degrees externally rotated.  We made this cut without difficulty and brought the  knee back down to full extension and with a 10 mm extension block, she still does slightly hyperextended.  We then went back to the  femur and put our femoral sizing guide based off the epicondylar axis.  Based off this, we chose a size 4 femur.  We placed our 4-in-1 cutting block for a size 4 femur, made our anterior and posterior cuts, followed by our chamfer cuts.  We then made our   femoral box cut.  Attention was then turned back to the tibia.  We chose a size 4 tibial tray for coverage, setting the rotation off the femur and the tibial tubercle.  We made our keel punch off of this.  With size 4 tibial tray followed by the size 4  left femur, we trialed a 10, a 11 and then 13 mm polyethylene insert.  We felt good about the stability with a 13 mm insert.  We then made our patellar cut and drilled 3 holes for a size 32 patellar button.  With the trial instrumentation in the knee, I  put the knee through several cycles of range of motion.  We were pleased with the motion.  We then removed all instrumentation from the knee and irrigated the knee with normal saline solution.  I placed my epinephrine and Marcaine mixture around the knee  joint and then mixed our cement.  With the knee in a flexed position, we dried the knee real well and then cemented our Stryker Triathlon tibial tray size 4 followed by a 4 left femur.  We placed our 13 mm fixed bearing polyethylene insert and cemented  our size 32 patellar button.  We then held the knee in extended position while the cement hardened as we compressed this.  We removed excess cement debris from the knee and once the cement had hardened, we let the tourniquet down and hemostasis obtained  with electrocautery.  We then closed the arthrotomy with interrupted #1 Vicryl suture followed by 0 Vicryl to close the deep tissue and 2-0 Vicryl was used to close the subcutaneous tissue.  The skin was reapproximated with staples.  Xeroform well-padded  sterile dressing was applied.  She was then taken to recovery room in stable condition with all final counts being correct.  No complications noted.  Of note, Rexene Edison, PA-C, assisted during the entire case and his assistance was crucial for  facilitating all aspects of this case.  HN/NUANCE  D:07/27/2020 T:07/28/2020 JOB:[id number]

## 2020-07-28 NOTE — Progress Notes (Signed)
Physical Therapy Treatment Patient Details Name: Olivia Barnes MRN: [id number] DOB: Feb 16, 1956 Today's Date: 07/28/2020    History of Present Illness Pt s/p L TKR and with hx of bil THR    PT Comments    Improved activity tolerance with limited c/o nausea this pm.  Pt hopeful for dc home fomorrow.   Follow Up Recommendations  Home health PT;Follow surgeon's recommendation for DC plan and follow-up therapies     Equipment Recommendations  None recommended by PT    Recommendations for Other Services       Precautions / Restrictions Precautions Precautions: Knee;Fall Required Braces or Orthoses: Knee Immobilizer - Left Knee Immobilizer - Left: Discontinue once straight leg raise with < 10 degree lag Restrictions Weight Bearing Restrictions: No LLE Weight Bearing: Weight bearing as tolerated    Mobility  Bed Mobility               General bed mobility comments: Pt up in chair and requests back to same  Transfers Overall transfer level: Needs assistance Equipment used: Rolling walker (2 wheeled) Transfers: Sit to/from Stand Sit to Stand: Min assist         General transfer comment: cues for LE management and use of UEs to self assist  Ambulation/Gait Ambulation/Gait assistance: Min assist Gait Distance (Feet): 28 Feet Assistive device: Rolling walker (2 wheeled) Gait Pattern/deviations: Step-to pattern;Decreased step length - right;Decreased step length - left;Shuffle;Trunk flexed Gait velocity: decr   General Gait Details: cues for sequence, posture and position from [employer]             Wheelchair Mobility    Modified Rankin (Stroke Patients Only)       Balance Overall balance assessment: Needs assistance Sitting-balance support: No upper extremity supported;Feet supported Sitting balance-Leahy Scale: Fair     Standing balance support: Bilateral upper extremity supported Standing balance-Leahy Scale: Poor                               Cognition Arousal/Alertness: Awake/alert Behavior During Therapy: WFL for tasks assessed/performed Overall Cognitive Status: Within Functional Limits for tasks assessed                                        Exercises Total Joint Exercises Ankle Circles/Pumps: AROM;15 reps;Supine;Both Quad Sets: AROM;Both;10 reps;Supine Heel Slides: AAROM;Left;10 reps;Supine Straight Leg Raises: AAROM;Left;10 reps;Supine    General Comments        Pertinent Vitals/Pain Pain Assessment: 0-10 Pain Score: 4  Pain Location: L knee Pain Descriptors / Indicators: Aching;Sore Pain Intervention(s): Limited activity within patient's tolerance;Monitored during session;Premedicated before session;Ice applied    Home Living                      Prior Function            PT Goals (current goals can now be found in the care plan section) Acute Rehab PT Goals Patient Stated Goal: Regain IND PT Goal Formulation: With patient Time For Goal Achievement: 08/10/20 Potential to Achieve Goals: Good Progress towards PT goals: Progressing toward goals    Frequency    7X/week      PT Plan Current plan remains appropriate    Co-evaluation              AM-PAC PT "6 Clicks" Mobility  Outcome Measure  Help needed turning from your back to your side while in a flat bed without using bedrails?: A Little Help needed moving from lying on your back to sitting on the side of a flat bed without using bedrails?: A Little Help needed moving to and from a bed to a chair (including a wheelchair)?: A Little Help needed standing up from a chair using your arms (e.g., wheelchair or bedside chair)?: A Little Help needed to walk in hospital room?: A Little Help needed climbing 3-5 steps with a railing? : A Lot 6 Click Score: 17    End of Session Equipment Utilized During Treatment: Gait belt;Left knee immobilizer Activity Tolerance: Patient tolerated treatment well  Patient left: with call bell/phone within reach;in chair;with chair alarm set Nurse Communication: Mobility status PT Visit Diagnosis: Difficulty in walking, not elsewhere classified (R26.2)     Time: 7026-3785 PT Time Calculation (min) (ACUTE ONLY): 26 min  Charges:  $Gait Training: 8-22 mins $Therapeutic Exercise: 8-22 mins                     Mauro Kaufmann PT Acute Rehabilitation Services Pager [phone] Office [phone]    BRADSHAW,HUNTER 07/28/2020, 4:14 PM

## 2020-07-28 NOTE — Progress Notes (Signed)
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  RN aware.  Follow Up Recommendations  Home health PT;Follow surgeon's recommendation for DC plan and follow-up therapies     Equipment Recommendations  None recommended by PT    Recommendations for Other Services       Precautions / Restrictions Precautions Precautions: Knee;Fall Required Braces or Orthoses: Knee Immobilizer - Left Knee Immobilizer - Left: Discontinue once straight leg raise with < 10 degree lag Restrictions Weight Bearing Restrictions: No LLE Weight Bearing: Weight bearing as tolerated    Mobility  Bed Mobility Overal bed mobility: Needs Assistance Bed Mobility: Sit to Supine       Sit to supine: Mod assist   General bed mobility comments: cues for sequence and use of R LE to self assist;  Physical assist to manage L LE and to bring trunk to upright  Transfers Overall transfer level: Needs assistance Equipment used: Rolling walker (2 wheeled) Transfers: Sit to/from Stand Sit to Stand: Min assist;From elevated surface         General transfer comment: cues for LE management and use of UEs to self assist  Ambulation/Gait Ambulation/Gait assistance: Min assist Gait Distance (Feet): 2 Feet Assistive device: Rolling walker (2 wheeled) Gait Pattern/deviations: Step-to pattern;Decreased step length - right;Decreased step length - left;Shuffle;Trunk flexed Gait velocity: decr   General Gait Details: cues for sequence, posture and position from RW; distance ltd by nausea   Stairs             Wheelchair Mobility    Modified Rankin (Stroke Patients Only)       Balance Overall balance assessment: Needs assistance Sitting-balance support: No  upper extremity supported;Feet supported Sitting balance-Leahy Scale: Fair     Standing balance support: Bilateral upper extremity supported Standing balance-Leahy Scale: Poor                              Cognition Arousal/Alertness: Awake/alert Behavior During Therapy: WFL for tasks assessed/performed Overall Cognitive Status: Within Functional Limits for tasks assessed                                        Exercises      General Comments        Pertinent Vitals/Pain Pain Assessment: 0-10 Pain Score: 6  Pain Location: L knee Pain Descriptors / Indicators: Aching;Sore Pain Intervention(s): Limited activity within patient's tolerance;Monitored during session;Premedicated before session;Ice applied    Home Living                      Prior Function            PT Goals (current goals can now be found in the care plan section) Acute Rehab PT Goals Patient Stated Goal: Regain IND PT Goal Formulation: With patient Time For Goal Achievement: 08/10/20 Potential to Achieve Goals: Good Progress towards PT goals: Not progressing toward goals - comment (N&V)    Frequency    7X/week      PT Plan Current plan remains appropriate    Co-evaluation  AM-PAC PT "6 Clicks" Mobility   Outcome Measure  Help needed turning from your back to your side while in a flat bed without using bedrails?: A Little Help needed moving from lying on your back to sitting on the side of a flat bed without using bedrails?: A Little Help needed moving to and from a bed to a chair (including a wheelchair)?: A Little Help needed standing up from a chair using your arms (e.g., wheelchair or bedside chair)?: A Little Help needed to walk in hospital room?: A Little Help needed climbing 3-5 steps with a railing? : A Lot 6 Click Score: 17    End of Session Equipment Utilized During Treatment: Gait belt;Left knee immobilizer Activity  Tolerance: Patient tolerated treatment well Patient left: with call bell/phone within reach;in bed;with bed alarm set Nurse Communication: Mobility status (N&V) PT Visit Diagnosis: Difficulty in walking, not elsewhere classified (R26.2)     Time: 1100-1120 PT Time Calculation (min) (ACUTE ONLY): 20 min  Charges:  $Gait Training: 8-22 mins                     Mauro Kaufmann PT Acute Rehabilitation Services Pager [phone] Office [phone]    Javious Hallisey 07/28/2020, 12:29 PM

## 2020-07-28 NOTE — Progress Notes (Signed)
     Subjective: 1 Day Post-Op Procedure(s) (LRB): LEFT TOTAL KNEE ARTHROPLASTY (Left) At toilet this AM. Standing and pivoting to commode. Moderate pain.  Patient reports pain as moderate.    Objective:   VITALS:  Temp:  [97.6 F (36.4 C)-99.2 F (37.3 C)] 99.2 F (37.3 C) (12/04 0625) Pulse Rate:  [46-67] 64 (12/04 0625) Resp:  [12-20] 18 (12/04 0625) BP: (133-163)/(56-101) 144/61 (12/04 0625) SpO2:  [96 %-100 %] 99 % (12/04 0625)  Neurologically intact ABD soft Neurovascular intact Sensation intact distally Intact pulses distally Dorsiflexion/Plantar flexion intact Incision: dressing C/D/I and no drainage   LABS Recent Labs    07/28/20 0409  HGB 10.9*  WBC 8.3  PLT 140*   Recent Labs    07/28/20 0409  NA 135  K 4.3  CL 103  CO2 23  BUN 18  CREATININE 0.63  GLUCOSE 145*   No results for input(s): LABPT, INR in the last 72 hours.   Assessment/Plan: 1 Day Post-Op Procedure(s) (LRB): LEFT TOTAL KNEE ARTHROPLASTY (Left)  Advance diet Up with therapy D/C IV fluids Plan for discharge tomorrow  Vira Browns 07/28/2020, 9:59 AMPatient ID: Olivia Barnes, female   DOB: [birth year], 64 y.o.   MRN: [id number]

## 2020-07-29 NOTE — Progress Notes (Signed)
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  Pt ambulated increased distance, negotiated stairs, performed HEP, and reviewed don/doff KI.  Spouse present for entire session to observe and assist.   Follow Up Recommendations  Home health PT;Follow surgeon's recommendation for DC plan and follow-up therapies     Equipment Recommendations  None recommended by PT    Recommendations for Other Services       Precautions / Restrictions Precautions Precautions: Knee;Fall Required Braces or Orthoses: Knee Immobilizer - Left Knee Immobilizer - Left: Discontinue once straight leg raise with < 10 degree lag Restrictions Weight Bearing Restrictions: No LLE Weight Bearing: Weight bearing as tolerated    Mobility  Bed Mobility Overal bed mobility: Needs Assistance Bed Mobility: Supine to Sit     Supine to sit: Supervision     General bed mobility comments: increased time with use of bedrail but no physical assist  Transfers Overall transfer level: Needs assistance Equipment used: Rolling walker (2 wheeled) Transfers: Sit to/from Stand Sit to Stand: Min guard;Supervision         General transfer comment: cues for LE management and use of UEs to self assist  Ambulation/Gait Ambulation/Gait assistance: Min guard;Supervision Gait Distance (Feet): 45 Feet Assistive device: Rolling walker (2 wheeled) Gait Pattern/deviations: Step-to pattern;Decreased step length - right;Decreased step length - left;Shuffle;Trunk flexed Gait velocity: decr   General Gait Details: cues for sequence, posture and position from RW   Stairs Stairs: Yes Stairs assistance: Min assist Stair Management: No rails;Forwards;With walker;Step to pattern Number of  Stairs: 2 General stair comments: single step twice with RW and cues for sequence and foot/RW placement; spouse assisting on second attempt   Wheelchair Mobility    Modified Rankin (Stroke Patients Only)       Balance Overall balance assessment: Needs assistance Sitting-balance support: No upper extremity supported;Feet supported Sitting balance-Leahy Scale: Fair     Standing balance support: Bilateral upper extremity supported Standing balance-Leahy Scale: Poor                              Cognition Arousal/Alertness: Awake/alert Behavior During Therapy: WFL for tasks assessed/performed Overall Cognitive Status: Within Functional Limits for tasks assessed                                        Exercises Total Joint Exercises Ankle Circles/Pumps: AROM;15 reps;Supine;Both Quad Sets: AROM;Both;10 reps;Supine Heel Slides: AAROM;Left;Supine;20 reps Straight Leg Raises: AAROM;Left;Supine;20 reps    General Comments        Pertinent Vitals/Pain Pain Assessment: 0-10 Pain Score: 5  Pain Location: L knee Pain Descriptors / Indicators: Aching;Sore Pain Intervention(s): Limited activity within patient's tolerance;Monitored during session;Premedicated before session;Ice applied    Home Living                      Prior Function            PT Goals (current goals can now be found in the care plan section) Acute Rehab PT Goals Patient Stated Goal: Regain IND PT Goal Formulation: With patient Time For  Goal Achievement: 08/10/20 Potential to Achieve Goals: Good Progress towards PT goals: Progressing toward goals    Frequency    7X/week      PT Plan Current plan remains appropriate    Co-evaluation              AM-PAC PT "6 Clicks" Mobility   Outcome Measure  Help needed turning from your back to your side while in a flat bed without using bedrails?: A Little Help needed moving from lying on your back to sitting on  the side of a flat bed without using bedrails?: A Little Help needed moving to and from a bed to a chair (including a wheelchair)?: A Little Help needed standing up from a chair using your arms (e.g., wheelchair or bedside chair)?: A Little Help needed to walk in hospital room?: A Little Help needed climbing 3-5 steps with a railing? : A Little 6 Click Score: 18    End of Session Equipment Utilized During Treatment: Gait belt;Left knee immobilizer Activity Tolerance: Patient tolerated treatment well Patient left: with call bell/phone within reach;in chair;with chair alarm set;with family/visitor present Nurse Communication: Mobility status PT Visit Diagnosis: Difficulty in walking, not elsewhere classified (R26.2)     Time: 1610-9604 PT Time Calculation (min) (ACUTE ONLY): 43 min  Charges:  $Gait Training: 8-22 mins $Therapeutic Exercise: 8-22 mins $Therapeutic Activity: 8-22 mins                     Mauro Kaufmann PT Acute Rehabilitation Services Pager [phone] Office [phone]    Olivia Barnes 07/29/2020, 12:21 PM

## 2020-07-29 NOTE — Progress Notes (Signed)
     Subjective: 2 Days Post-Op Procedure(s) (LRB): LEFT TOTAL KNEE ARTHROPLASTY (Left) Awake, alert and oriented x 4. I want to go home.  Walking with PT in hallway. HHN referral for post op rehabilitation.   Patient reports pain as mild.    Objective:   VITALS:  Temp:  [98 F (36.7 C)-98.6 F (37 C)] 98.3 F (36.8 C) (12/05 0537) Pulse Rate:  [70-81] 81 (12/05 0537) Resp:  [14-19] 14 (12/05 0537) BP: (127-138)/(53-60) 127/58 (12/05 0537) SpO2:  [98 %-100 %] 100 % (12/05 0537)  Neurologically intact ABD soft Neurovascular intact Sensation intact distally Intact pulses distally Dorsiflexion/Plantar flexion intact Incision: no drainage and scant drainage No cellulitis present Compartment soft   LABS Recent Labs    07/28/20 0409  HGB 10.9*  WBC 8.3  PLT 140*   Recent Labs    07/28/20 0409  NA 135  K 4.3  CL 103  CO2 23  BUN 18  CREATININE 0.63  GLUCOSE 145*   No results for input(s): LABPT, INR in the last 72 hours.   Assessment/Plan: 2 Days Post-Op Procedure(s) (LRB): LEFT TOTAL KNEE ARTHROPLASTY (Left)  Advance diet Up with therapy D/C IV fluids Discharge home with home health  Vira Browns 07/29/2020, 10:11 AMPatient ID: Olivia Barnes, female   DOB: 05-20-1956, 64 y.o.   MRN: [id number]

## 2020-07-29 NOTE — Plan of Care (Signed)
Patient dc'd all care plans completed  

## 2020-07-29 NOTE — Discharge Instructions (Signed)

## 2020-07-29 NOTE — Plan of Care (Signed)
°  Problem: Education: °Goal: Knowledge of General Education information will improve °Description: Including pain rating scale, medication(s)/side effects and non-pharmacologic comfort measures °Outcome: Progressing °  °Problem: Clinical Measurements: °Goal: Ability to maintain clinical measurements within normal limits will improve °Outcome: Progressing °  °Problem: Nutrition: °Goal: Adequate nutrition will be maintained °Outcome: Progressing °  °

## 2020-07-29 NOTE — TOC Progression Note (Signed)
Transition of Care [facility]) - Progression Note    Patient Details  Name: Favor Kreh MRN: [id number] Date of Birth: 1955-12-29  Transition of Care [facility]) CM/SW Contact  Armanda Heritage, RN Phone Number: 07/29/2020, 1:14 PM  Clinical Narrative:    CM spoke with patient who reports she has rolling walker and 3in1 at home.  [facility] to provide home health physical therapy.    Expected Discharge Plan: Home w Home Health Services Barriers to Discharge: No Barriers Identified  Expected Discharge Plan and Services Expected Discharge Plan: Home w Home Health Services   Discharge Planning Services: CM Consult Post Acute Care Choice: Home Health Living arrangements for the past 2 months: Single Family Home Expected Discharge Date: 07/29/20               DME Arranged: N/A DME Agency: NA       HH Arranged: PT HH Agency: [facility] Date [facility] Agency Contacted: 07/29/20 Time HH Agency Contacted: [phone] Representative spoke with at [facility] Agency: Arline Asp   Social Determinants of Health (SDOH) Interventions    Readmission Risk Interventions No flowsheet data found.

## 2020-07-30 ENCOUNTER — Encounter (HOSPITAL_COMMUNITY): Payer: Self-pay | Admitting: Orthopaedic Surgery

## 2020-07-31 ENCOUNTER — Telehealth: Payer: Self-pay | Admitting: Orthopaedic Surgery

## 2020-07-31 NOTE — Discharge Summary (Signed)
Patient ID: Olivia Barnes MRN: [id number] DOB/AGE: 1955/09/25 64 y.o.  Admit date: 07/27/2020 Discharge date: 07/31/2020  Admission Diagnoses:  Principal Problem:   Unilateral primary osteoarthritis, left knee Active Problems:   Status post total left knee replacement   Discharge Diagnoses:  Status post left total knee arthroplasty  Past Medical History:  Diagnosis Date  . Arthritis   . Hyperlipemia   . Hypertension     Surgeries: Procedure(s): LEFT TOTAL KNEE ARTHROPLASTY on 07/27/2020   Consultants:   Discharged Condition: Improved  Hospital Course: Olivia Barnes is an 64 y.o. female who was admitted 07/27/2020 for operative treatment ofUnilateral primary osteoarthritis, left knee. Patient has severe unremitting pain that affects sleep, daily activities, and work/hobbies. After pre-op clearance the patient was taken to the operating room on 07/27/2020 and underwent  Procedure(s): LEFT TOTAL KNEE ARTHROPLASTY.    Patient was given perioperative antibiotics:  Anti-infectives (From admission, onward)   Start     Dose/Rate Route Frequency Ordered Stop   07/27/20 1630  clindamycin (CLEOCIN) IVPB 600 mg        600 mg 100 mL/hr over 30 Minutes Intravenous Every 6 hours 07/27/20 1343 07/27/20 2239   07/27/20 0745  clindamycin (CLEOCIN) IVPB 900 mg        900 mg 100 mL/hr over 30 Minutes Intravenous On call to O.R. 07/27/20 0733 07/27/20 1031       Patient was given sequential compression devices, early ambulation, and chemoprophylaxis to prevent DVT.  Patient benefited maximally from hospital stay and there were no complications.    Recent vital signs: No data found.   Recent laboratory studies: No results for input(s): WBC, HGB, HCT, PLT, NA, K, CL, CO2, BUN, CREATININE, GLUCOSE, INR, CALCIUM in the last 72 hours.  Invalid input(s): PT, 2   Discharge Medications:   Allergies as of 07/29/2020      Reactions   Gabapentin Nausea And Vomiting   Penicillins Rash       Medication List    STOP taking these medications   Diclofenac Sodium 2 % Soln   Pennsaid 2 % Soln Generic drug: Diclofenac Sodium   tiZANidine 4 MG tablet Commonly known as: ZANAFLEX     TAKE these medications   aspirin 81 MG chewable tablet Chew 1 tablet (81 mg total) by mouth 2 (two) times daily.   bimatoprost 0.03 % ophthalmic solution Commonly known as: LUMIGAN Place 1 drop into both eyes at bedtime.   Combigan 0.2-0.5 % ophthalmic solution Generic drug: brimonidine-timolol Place 1 drop into both eyes 2 (two) times daily.   Humira 40 MG/0.4ML Pskt Generic drug: Adalimumab Inject 40 mg into the skin every 14 (fourteen) days.   losartan-hydrochlorothiazide 100-12.5 MG tablet Commonly known as: HYZAAR Take 1 tablet by mouth daily.   methocarbamol 500 MG tablet Commonly known as: ROBAXIN Take 1 tablet (500 mg total) by mouth every 6 (six) hours as needed for muscle spasms.   MULTIVITAMIN PO Take 1 tablet by mouth daily.   mycophenolate 500 MG tablet Commonly known as: CELLCEPT Take 1,500 mg by mouth 2 (two) times daily.   oxyCODONE 5 MG immediate release tablet Commonly known as: Oxy IR/ROXICODONE Take 1-2 tablets (5-10 mg total) by mouth every 4 (four) hours as needed for moderate pain (pain score 4-6).   polyethylene glycol powder 17 GM/SCOOP powder Commonly known as: GLYCOLAX/MIRALAX Take 1 Container by mouth daily.   rosuvastatin 20 MG tablet Commonly known as: CRESTOR Take 20 mg by mouth daily.   tacrolimus  1 MG capsule Commonly known as: PROGRAF Take 2 mg by mouth 2 (two) times daily.       Diagnostic Studies: X-ray knee left AP and lateral  Result Date: 07/27/2020 CLINICAL DATA:  Knee replacement. EXAM: LEFT KNEE - 1-2 VIEW COMPARISON:  07/04/2020 FINDINGS: Interval right total knee replacement. No evidence for immediate hardware complication. Gas in the soft tissues compatible with the immediate postoperative state. IMPRESSION: Tricompartmental  knee replacement without complicating features. Electronically Signed   By: Kennith CenterEric  Mansell M.D.   On: 07/27/2020 13:53   US LIMITED JOINT SPACE STRUCTURES LOW RIGHT(NO LINKED CHARGES)  Result Date: 07/06/2020 Limited musculoskeletal ultrasound was performed and interpreted by Judi SaaZachary M Smith Limited ultrasound of patient's right knee shows the patient does have a trace effusion noted of the patellofemoral joint with narrowing of the patellofemoral joint.  Patient also has moderate arthritic changes of the medial and lateral compartment.  No meniscus pathology though noted on ultrasound today. Impression: Knee arthritis with trace effusion  DG Knee 3 Views Right  Result Date: 07/04/2020 CLINICAL DATA:  Chronic right knee pain. EXAM: RIGHT KNEE - 3 VIEW COMPARISON:  None. FINDINGS: No acute fracture or dislocation. No joint effusion. Joint spaces are preserved. Tiny tricompartmental marginal osteophytes. Bone mineralization is normal. Soft tissues are unremarkable. IMPRESSION: 1. Minimal tricompartmental degenerative changes. Electronically Signed   By: Obie DredgeWilliam T Derry M.D.   On: 07/04/2020 15:38    Disposition: Discharge disposition: 01-Home or Self Care       Discharge Instructions    Call MD / Call 911   Complete by: As directed    If you experience chest pain or shortness of breath, CALL 911 and be transported to the hospital emergency room.  If you develope a fever above 101 F, pus (white drainage) or increased drainage or redness at the wound, or calf pain, call your surgeon's office.   Constipation Prevention   Complete by: As directed    Drink plenty of fluids.  Prune juice may be helpful.  You may use a stool softener, such as Colace (over the counter) 100 mg twice a day.  Use MiraLax (over the counter) for constipation as needed.   Diet - low sodium heart healthy   Complete by: As directed    Discharge instructions   Complete by: As directed    INSTRUCTIONS AFTER JOINT  REPLACEMENT   Remove items at home which could result in a fall. This includes throw rugs or furniture in walking pathways ICE to the affected joint every three hours while awake for 30 minutes at a time, for at least the first 3-5 days, and then as needed for pain and swelling.  Continue to use ice for pain and swelling. You may notice swelling that will progress down to the foot and ankle.  This is normal after surgery.  Elevate your leg when you are not up walking on it.   Continue to use the breathing machine you got in the hospital (incentive spirometer) which will help keep your temperature down.  It is common for your temperature to cycle up and down following surgery, especially at night when you are not up moving around and exerting yourself.  The breathing machine keeps your lungs expanded and your temperature down.   DIET:  As you were doing prior to hospitalization, we recommend a well-balanced diet.  DRESSING / WOUND CARE / SHOWERING  Keep the surgical dressing until follow up.  The dressing is water proof, so you  can shower without any extra covering.  IF THE DRESSING FALLS OFF or the wound gets wet inside, change the dressing with sterile gauze.  Please use good hand washing techniques before changing the dressing.  Do not use any lotions or creams on the incision until instructed by your surgeon.    ACTIVITY  Increase activity slowly as tolerated, but follow the weight bearing instructions below.   No driving for 6 weeks or until further direction given by your physician.  You cannot drive while taking narcotics.  No lifting or carrying greater than 10 lbs. until further directed by your surgeon. Avoid periods of inactivity such as sitting longer than an hour when not asleep. This helps prevent blood clots.  You may return to work once you are authorized by your doctor.     WEIGHT BEARING   Weight bearing as tolerated with assist device (walker, cane, etc) as directed, use it  as long as suggested by your surgeon or therapist, typically at least 4-6 weeks.   EXERCISES  Results after joint replacement surgery are often greatly improved when you follow the exercise, range of motion and muscle strengthening exercises prescribed by your doctor. Safety measures are also important to protect the joint from further injury. Any time any of these exercises cause you to have increased pain or swelling, decrease what you are doing until you are comfortable again and then slowly increase them. If you have problems or questions, call your caregiver or physical therapist for advice.   Rehabilitation is important following a joint replacement. After just a few days of immobilization, the muscles of the leg can become weakened and shrink (atrophy).  These exercises are designed to build up the tone and strength of the thigh and leg muscles and to improve motion. Often times heat used for twenty to thirty minutes before working out will loosen up your tissues and help with improving the range of motion but do not use heat for the first two weeks following surgery (sometimes heat can increase post-operative swelling).   These exercises can be done on a training (exercise) mat, on the floor, on a table or on a bed. Use whatever works the best and is most comfortable for you.    Use music or television while you are exercising so that the exercises are a pleasant break in your day. This will make your life better with the exercises acting as a break in your routine that you can look forward to.   Perform all exercises about fifteen times, three times per day or as directed.  You should exercise both the operative leg and the other leg as well.  Exercises include:   Quad Sets - Tighten up the muscle on the front of the thigh (Quad) and hold for 5-10 seconds.   Straight Leg Raises - With your knee straight (if you were given a brace, keep it on), lift the leg to 60 degrees, hold for 3 seconds,  and slowly lower the leg.  Perform this exercise against resistance later as your leg gets stronger.  Leg Slides: Lying on your back, slowly slide your foot toward your buttocks, bending your knee up off the floor (only go as far as is comfortable). Then slowly slide your foot back down until your leg is flat on the floor again.  Angel Wings: Lying on your back spread your legs to the side as far apart as you can without causing discomfort.  Hamstring Strength:  Lying on your  back, push your heel against the floor with your leg straight by tightening up the muscles of your buttocks.  Repeat, but this time bend your knee to a comfortable angle, and push your heel against the floor.  You may put a pillow under the heel to make it more comfortable if necessary.   A rehabilitation program following joint replacement surgery can speed recovery and prevent re-injury in the future due to weakened muscles. Contact your doctor or a physical therapist for more information on knee rehabilitation.    CONSTIPATION  Constipation is defined medically as fewer than three stools per week and severe constipation as less than one stool per week.  Even if you have a regular bowel pattern at home, your normal regimen is likely to be disrupted due to multiple reasons following surgery.  Combination of anesthesia, postoperative narcotics, change in appetite and fluid intake all can affect your bowels.   YOU MUST use at least one of the following options; they are listed in order of increasing strength to get the job done.  They are all available over the counter, and you may need to use some, POSSIBLY even all of these options:    Drink plenty of fluids (prune juice may be helpful) and high fiber foods Colace 100 mg by mouth twice a day  Senokot for constipation as directed and as needed Dulcolax (bisacodyl), take with full glass of water  Miralax (polyethylene glycol) once or twice a day as needed.  If you have tried  all these things and are unable to have a bowel movement in the first 3-4 days after surgery call either your surgeon or your primary doctor.    If you experience loose stools or diarrhea, hold the medications until you stool forms back up.  If your symptoms do not get better within 1 week or if they get worse, check with your doctor.  If you experience "the worst abdominal pain ever" or develop nausea or vomiting, please contact the office immediately for further recommendations for treatment.   ITCHING:  If you experience itching with your medications, try taking only a single pain pill, or even half a pain pill at a time.  You can also use Benadryl over the counter for itching or also to help with sleep.   TED HOSE STOCKINGS:  Use stockings on both legs until for at least 2 weeks or as directed by physician office. They may be removed at night for sleeping.  MEDICATIONS:  See your medication summary on the "After Visit Summary" that nursing will review with you.  You may have some home medications which will be placed on hold until you complete the course of blood thinner medication.  It is important for you to complete the blood thinner medication as prescribed.  PRECAUTIONS:  If you experience chest pain or shortness of breath - call 911 immediately for transfer to the hospital emergency department.   If you develop a fever greater that 101 F, purulent drainage from wound, increased redness or drainage from wound, foul odor from the wound/dressing, or calf pain - CONTACT YOUR SURGEON.                                                   FOLLOW-UP APPOINTMENTS:  If you do not already have a post-op appointment, please call the  office for an appointment to be seen by your surgeon.  Guidelines for how soon to be seen are listed in your "After Visit Summary", but are typically between 1-4 weeks after surgery.  OTHER INSTRUCTIONS:   Knee Replacement:  Do not place pillow under knee, focus on keeping  the knee straight while resting. CPM instructions: 0-90 degrees, 2 hours in the morning, 2 hours in the afternoon, and 2 hours in the evening. Place foam block, curve side up under heel at all times except when in CPM or when walking.  DO NOT modify, tear, cut, or change the foam block in any way.   DENTAL ANTIBIOTICS:  In most cases prophylactic antibiotics for Dental procdeures after total joint surgery are not necessary.  Exceptions are as follows:  1. History of prior total joint infection  2. Severely immunocompromised (Organ Transplant, cancer chemotherapy, Rheumatoid biologic meds such as Humera)  3. Poorly controlled diabetes (A1C &gt; 8.0, blood glucose over 200)  If you have one of these conditions, contact your surgeon for an antibiotic prescription, prior to your dental procedure.   MAKE SURE YOU:  Understand these instructions.  Get help right away if you are not doing well or get worse.    Thank you for letting us be a part of your medical care team.  It is a privilege we respect greatly.  We hope these instructions will help you stay on track for a fast and full recovery!  Dental Antibiotics:  In most cases prophylactic antibiotics for Dental procdeures after total joint surgery are not necessary.  Exceptions are as follows:  1. History of prior total joint infection  2. Severely immunocompromised (Organ Transplant, cancer chemotherapy, Rheumatoid biologic meds such as Humera)  3. Poorly controlled diabetes (A1C &gt; 8.0, blood glucose over 200)  If you have one of these conditions, contact your surgeon for an antibiotic prescription, prior to your dental procedure.   Driving restrictions   Complete by: As directed    No driving for 6 weeks   Increase activity slowly as tolerated   Complete by: As directed    Lifting restrictions   Complete by: As directed    No lifting for 8 weeks       Follow-up Information    Kathryne Hitch, MD Follow up  in 2 week(s).   Specialty: Orthopedic Surgery Contact information: [address] [place] [address] [phone]        [facility] Follow up.   Specialty: Home Health Services Why: agency will provide home health therapy Contact information: [address] [place] [address] [phone]                Signed: Richardean Canal 07/31/2020, 2:33 PM

## 2020-07-31 NOTE — Telephone Encounter (Signed)
That will be fine from my standpoint.  Please give letter note that they feel is necessary.  Thank you.

## 2020-07-31 NOTE — Telephone Encounter (Signed)
Ok for note 

## 2020-07-31 NOTE — Telephone Encounter (Signed)
Lvm for pt to call back to get husbands name to complete note

## 2020-07-31 NOTE — Telephone Encounter (Signed)
Received disability/FMLA form    Forwarding to [employer] today

## 2020-07-31 NOTE — Telephone Encounter (Signed)
Pt states she recently had surgery and her husband has been out of work helping her so she would like to know if Dr.Blackman can write a note for his job to excuse him for the rest of the week? Pt would like a CB to be updated.   [phone]

## 2020-08-01 ENCOUNTER — Telehealth: Payer: Self-pay | Admitting: Orthopaedic Surgery

## 2020-08-01 ENCOUNTER — Telehealth: Payer: Self-pay

## 2020-08-01 DIAGNOSIS — M7989 Other specified soft tissue disorders: Secondary | ICD-10-CM

## 2020-08-01 NOTE — Telephone Encounter (Signed)
We can order a Doppler ultrasound of her left leg to rule out DVT.  This does not have to be done urgently though.

## 2020-08-01 NOTE — Telephone Encounter (Signed)
Olivia Barnes, PT with [facility] wanted to let Dr. Magnus Ivan know that patient is doing okay, but has some left calf swelling and pain.  Stated that when he squeezes her left calf, patient stated that it does hurt. Patient now has the correct support hose.  Cb# [phone]. Patient Cb# [phone].  Please advise.  Thank you.

## 2020-08-01 NOTE — Telephone Encounter (Signed)
Pt informed it was ready for pick up @ front desk

## 2020-08-01 NOTE — Telephone Encounter (Signed)
FYI

## 2020-08-01 NOTE — Telephone Encounter (Signed)
Note completed 

## 2020-08-01 NOTE — Telephone Encounter (Signed)
NOTED

## 2020-08-01 NOTE — Addendum Note (Signed)
Addended by: Barbette Or on: 08/01/2020 01:56 PM   Modules accepted: Orders

## 2020-08-01 NOTE — Telephone Encounter (Signed)
Olivia Barnes

## 2020-08-01 NOTE — Telephone Encounter (Signed)
error 

## 2020-08-01 NOTE — Telephone Encounter (Signed)
Husbands name above for note

## 2020-08-01 NOTE — Telephone Encounter (Signed)
Ultrasound order placed in chart   Pt called and informed and stated understanding

## 2020-08-02 ENCOUNTER — Telehealth: Payer: Self-pay | Admitting: Orthopaedic Surgery

## 2020-08-02 ENCOUNTER — Ambulatory Visit (HOSPITAL_COMMUNITY)
Admission: RE | Admit: 2020-08-02 | Discharge: 2020-08-02 | Disposition: A | Discharge status: Home / Self Care | Payer: 59 | Source: Ambulatory Visit | Attending: Orthopaedic Surgery | Admitting: Orthopaedic Surgery

## 2020-08-02 ENCOUNTER — Other Ambulatory Visit: Payer: Self-pay

## 2020-08-02 DIAGNOSIS — M7989 Other specified soft tissue disorders: Secondary | ICD-10-CM | POA: Diagnosis not present

## 2020-08-02 NOTE — Telephone Encounter (Signed)
Megan with [facility] called with the pts results, she stated that the DVT of the L leg was negative and she did state there was some difficult areas to see but there were no clots.  Aundra Millet CB# [phone]

## 2020-08-03 ENCOUNTER — Telehealth: Payer: Self-pay | Admitting: Orthopaedic Surgery

## 2020-08-03 NOTE — Telephone Encounter (Signed)
Received $25.00 cash  and FMLA/Disability paperwork for [facility]

## 2020-08-06 ENCOUNTER — Telehealth: Payer: Self-pay | Admitting: Orthopaedic Surgery

## 2020-08-06 NOTE — Telephone Encounter (Signed)
Patient's husband Fayrene Fearing submitted medical release forms, short term disability, and cash payment of $25.00. Accepted 08/06/20.

## 2020-08-07 ENCOUNTER — Telehealth: Payer: Self-pay | Admitting: Orthopaedic Surgery

## 2020-08-07 NOTE — Telephone Encounter (Signed)
Patient's medical request sent to [employer]

## 2020-08-09 ENCOUNTER — Other Ambulatory Visit: Payer: Self-pay

## 2020-08-09 ENCOUNTER — Ambulatory Visit (INDEPENDENT_AMBULATORY_CARE_PROVIDER_SITE_OTHER): Payer: 59 | Admitting: Orthopaedic Surgery

## 2020-08-09 ENCOUNTER — Encounter: Payer: Self-pay | Admitting: Orthopaedic Surgery

## 2020-08-09 DIAGNOSIS — Z96652 Presence of left artificial knee joint: Secondary | ICD-10-CM

## 2020-08-09 NOTE — Progress Notes (Signed)
Olivia Barnes is 2 weeks tomorrow status post a left total knee arthroplasty.  She feels like she is getting better.  She is using a walker and wearing compressive garments.  She reports increased motion and strength.  She is from the [place] area and would like to go to outpatient therapy in [facility].  She has been on aspirin twice a day.  There is no calf pain on my exam today.  The staples have been removed from her left knee and Steri-Strips applied.  She has almost full extension both only gets to about 70 to 80 degrees flexion.  I stressed the importance of her getting that knee bending is much as possible.  I gave her a generic prescription for outpatient physical therapy.  I would like to see her back in 4 weeks to see how she is doing overall.  She will take 1 aspirin daily for the next week and can stop her aspirin.  She can come out a compressive hose when she does not have any foot ankle and swelling.  If there is any issues between now for week she will let us know.

## 2020-08-14 ENCOUNTER — Ambulatory Visit: Payer: 59 | Admitting: Family Medicine

## 2020-08-20 ENCOUNTER — Telehealth: Payer: Self-pay | Admitting: Orthopaedic Surgery

## 2020-08-20 MED ORDER — OXYCODONE HCL 5 MG PO TABS
5.0000 mg | ORAL_TABLET | Freq: Four times a day (QID) | ORAL | 0 refills | Status: DC | PRN
Start: 2020-08-20 — End: 2020-10-12

## 2020-08-20 NOTE — Telephone Encounter (Signed)
Pt called requesting a refill on her oxycodone pt states it helps when she goes to PT

## 2020-09-03 ENCOUNTER — Encounter: Payer: Self-pay | Admitting: Family Medicine

## 2020-09-03 ENCOUNTER — Other Ambulatory Visit: Payer: Self-pay

## 2020-09-03 ENCOUNTER — Ambulatory Visit: Payer: 59 | Admitting: Family Medicine

## 2020-09-03 DIAGNOSIS — M1711 Unilateral primary osteoarthritis, right knee: Secondary | ICD-10-CM | POA: Diagnosis not present

## 2020-09-03 NOTE — Patient Instructions (Signed)
Thanks for making my new year You are doing well See me when you need me

## 2020-09-03 NOTE — Progress Notes (Signed)
[name] [address] [place] [address] Phone: [phone] Subjective:   [name], am serving as a scribe for Dr. Antoine Primas. This visit occurred during the SARS-CoV-2 public health emergency.  Safety protocols were in place, including screening questions prior to the visit, additional usage of staff PPE, and extensive cleaning of exam room while observing appropriate contact time as indicated for disinfecting solutions.   I'm seeing this patient by the request  of:  [name], MD  CC: Right knee pain follow-up  BJY:[id number]   07/04/2020 Patient continues to have some mild neck pain but only at night.  Spasm is improved but patient states it did not help with the pain.  We discussed changing the pillow at the moment with her only having pain at night.  Worsening pain will consider further work-up to once again multiple different problems patient has including surgery coming up.  Injection given today.  Patient needs to have his knee feeling better with her having replacement scheduled on the contralateral side.  Discussed with patient icing regimen and home exercise.  X-rays pending.  Would be surprised if there is any other finding other than arthritic changes.  Ultrasound did show a very small effusion.  Increase activity slowly.  Follow-up with me again 6 weeks after surgery.   Update 09/03/2020 Olivia Barnes is a 65 y.o. female coming in with complaint of neck pain, right knee pain. States that her knee and back have been doing ok since last visit. Feels like her pain is improving in each area.  Patient states that the more she does activity the more it seems to get more improvement.  Does have some mild discomfort in the left hip after doing physical therapy for the left knee replacement.     Patient did have a left-sided knee replacement July 27, 2020.  Patient is in physical therapy for this Patient's right knee shows that  patient did have mild to moderate tricompartmental osteoarthritic changes at last exam July 06, 2020  Past Medical History:  Diagnosis Date  . Arthritis   . Hyperlipemia   . Hypertension    Past Surgical History:  Procedure Laterality Date  . arm surgery Left    plate in left arm  . CYST EXCISION Left    knee   . 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 Marital status: Married    Spouse name: Not on file  . Number of children: Not on file  . Years of education: Not on file  . Highest education level: Not on file  Occupational History  . Not on file  Tobacco Use  . Smoking status: Never Smoker  . Smokeless tobacco: Never Used  Vaping Use  . Vaping Use: Never used  Substance and Sexual Activity  . Alcohol use: Never    Alcohol/week: 0.0 standard drinks  . Drug use: Never  . Sexual activity: Not on file  Other Topics Concern  . Not on file  Social History Narrative  . Not on file   Social Determinants of Health   Financial Resource Strain: Not on file  Food Insecurity: Not on file  Transportation Needs: Not on file  Physical Activity: Not on file  Stress:  Not on file  Social Connections: Not on file   Allergies  Allergen Reactions  . Gabapentin Nausea And Vomiting  . Penicillins Rash   Family History  Problem Relation Age of Onset  . Heart disease Mother   . Healthy Brother      Current Outpatient Medications (Cardiovascular):  .  losartan-hydrochlorothiazide (HYZAAR) 100-12.5 MG tablet, Take 1 tablet by mouth daily. .  rosuvastatin (CRESTOR) 20 MG tablet, Take 20 mg by mouth daily.   Current Outpatient Medications (Analgesics):  [name]  Adalimumab (HUMIRA) 40 MG/0.4ML PSKT, Inject 40 mg into the skin every 14  (fourteen) days.  [name]  aspirin 81 MG chewable tablet, Chew 1 tablet (81 mg total) by mouth 2 (two) times daily. [name]  oxyCODONE (OXY IR/ROXICODONE) 5 MG immediate release tablet, Take 1-2 tablets (5-10 mg total) by mouth every 6 (six) hours as needed for moderate pain (pain score 4-6).   Current Outpatient Medications (Other):  .  bimatoprost (LUMIGAN) 0.03 % ophthalmic solution, Place 1 drop into both eyes at bedtime.  .  COMBIGAN 0.2-0.5 % ophthalmic solution, Place 1 drop into both eyes 2 (two) times daily.  .  methocarbamol (ROBAXIN) 500 MG tablet, Take 1 tablet (500 mg total) by mouth every 6 (six) hours as needed for muscle spasms. .  Multiple Vitamin (MULTIVITAMIN PO), Take 1 tablet by mouth daily.  .  mycophenolate (CELLCEPT) 500 MG tablet, Take 1,500 mg by mouth 2 (two) times daily.  .  polyethylene glycol powder (GLYCOLAX/MIRALAX) 17 GM/SCOOP powder, Take 1 Container by mouth daily.  .  tacrolimus (PROGRAF) 1 MG capsule, Take 2 mg by mouth 2 (two) times daily.    Reviewed prior external information including notes and imaging from  primary care provider As well as notes that were available from care everywhere and other healthcare systems.  Past medical history, social, surgical and family history all reviewed in electronic medical record.  No pertanent information unless stated regarding to the chief complaint.   Review of Systems:  No headache, visual changes, nausea, vomiting, diarrhea, constipation, dizziness, abdominal pain, skin rash, fevers, chills, night sweats, weight loss, swollen lymph nodes, body aches, joint swelling, chest pain, shortness of breath, mood changes. POSITIVE muscle aches  Objective  Blood pressure (!) 150/82, pulse 60, height 5\' 3"  (1.6 m), weight 218 lb (98.9 kg), SpO2 99 %.   General: No apparent distress alert and oriented x3 mood and affect normal, dressed appropriately.  HEENT: Pupils equal, extraocular movements intact  Respiratory: Patient's speak  in full sentences and does not appear short of breath  Cardiovascular: Trace edema bilaterally Right knee exam does have some mild lateral tracking of the patella.  No significant swelling noted.  Very minimal tenderness to palpation.  Neurovascularly intact distally. Left knee exam shows the patient's incision is well-healed.  Already has 85 degrees of flexion.  Patient's left hip is fairly unremarkable.  Mild tenderness with resisted hip flexion   Impression and Recommendations:     The above documentation has been reviewed and is accurate and complete , DO

## 2020-09-03 NOTE — Assessment & Plan Note (Signed)
Patient's knee is doing relatively well.  Did have a replacement on the left side.  I am expecting patient to do better overall.  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  Encouraged her to continue to work with physical therapy and follow-up with me as needed

## 2020-09-06 ENCOUNTER — Telehealth: Payer: Self-pay

## 2020-09-06 ENCOUNTER — Ambulatory Visit (INDEPENDENT_AMBULATORY_CARE_PROVIDER_SITE_OTHER): Payer: 59 | Admitting: Orthopaedic Surgery

## 2020-09-06 ENCOUNTER — Encounter: Payer: Self-pay | Admitting: Orthopaedic Surgery

## 2020-09-06 ENCOUNTER — Other Ambulatory Visit: Payer: Self-pay

## 2020-09-06 DIAGNOSIS — Z96652 Presence of left artificial knee joint: Secondary | ICD-10-CM

## 2020-09-06 MED ORDER — MELOXICAM 15 MG PO TABS: 15.0000 mg | ORAL_TABLET | Freq: Every day | ORAL | 3 refills | Status: DC

## 2020-09-06 MED ORDER — HYDROCODONE-ACETAMINOPHEN 5-325 MG PO TABS
1.0000 | ORAL_TABLET | Freq: Four times a day (QID) | ORAL | 0 refills | Status: DC | PRN
Start: 2020-09-06 — End: 2020-10-12

## 2020-09-06 NOTE — Progress Notes (Signed)
The patient is now 6 weeks status post a left total knee arthroplasty.  She is ambulating with a cane.  She reports worst pain at night but feels like her range of motion and strength are improving.  She has been participating diligently in physical therapy.  She says that the most they have flex her back to with the left knee is 108 degrees.  On my exam today she has almost full extension with only mild swelling with her left knee.  Her flexion for me is just out of 100 degrees.  Her knee does feel ligamentously stable.  Her left calf is soft.  She will continue extensive outpatient physical therapy.  I do not anticipate her being able to return to work until Monday, February 28.  I will start her on meloxicam as an anti-inflammatory and I am going to try some hydrocodone for pain.  She is reporting some pain in her groin but her left hip moves smoothly and fluidly.  We will continue to watch this as well.

## 2020-09-06 NOTE — Telephone Encounter (Signed)
Patient called she stated meloxicam was sent in and the last time she took the medication she got sick to her stomach she is requesting something different if possible. [phone]

## 2020-09-07 ENCOUNTER — Telehealth: Payer: Self-pay | Admitting: Orthopaedic Surgery

## 2020-09-07 ENCOUNTER — Other Ambulatory Visit: Payer: Self-pay | Admitting: Orthopaedic Surgery

## 2020-09-07 MED ORDER — CELECOXIB 200 MG PO CAPS: 200.0000 mg | ORAL_CAPSULE | Freq: Two times a day (BID) | ORAL | 1 refills | Status: DC | PRN

## 2020-09-07 NOTE — Telephone Encounter (Signed)
Patient called advised [facility] will not approve Rx (Hydrocodone) without a prior auth. The fax # to [facility] is [phone] The number to contact patient is [phone]

## 2020-09-07 NOTE — Telephone Encounter (Signed)
Please advise 

## 2020-09-07 NOTE — Telephone Encounter (Signed)
[place] forms received. Sent to [employer] 

## 2020-09-12 ENCOUNTER — Other Ambulatory Visit: Payer: Self-pay | Admitting: Orthopaedic Surgery

## 2020-10-03 ENCOUNTER — Telehealth: Payer: Self-pay | Admitting: Orthopaedic Surgery

## 2020-10-03 NOTE — Telephone Encounter (Signed)
[place] forms received. Sent to [employer] 

## 2020-10-04 ENCOUNTER — Encounter: Payer: Self-pay | Admitting: Orthopaedic Surgery

## 2020-10-04 ENCOUNTER — Ambulatory Visit (INDEPENDENT_AMBULATORY_CARE_PROVIDER_SITE_OTHER): Payer: 59 | Admitting: Orthopaedic Surgery

## 2020-10-04 DIAGNOSIS — Z96652 Presence of left artificial knee joint: Secondary | ICD-10-CM

## 2020-10-04 NOTE — Progress Notes (Signed)
The patient is now about 9 weeks status post a left total knee arthroplasty.  She is doing well overall.  She still has some soreness around her left hip and her left knee.  She does take Tylenol for pain.  She has been going to physical therapy and states that they have been able to flex her to about 112 degrees on that left knee.  She is an active 65 years old.  Examination her left knee shows full extension.  I was able to flex her to about 100 degrees in the office today.  The knee feels stable.  There is some swelling to be expected.  She will continue to increase her activities as comfort allows.  I will allow her to return to work October 22, 2020.  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  I will see her back in 3 months or sooner if there is any issues.  At that 61-month visit I would like a standing AP and lateral of her left operative knee.

## 2020-10-09 NOTE — Progress Notes (Unsigned)
Office Visit Note  Patient: Olivia Barnes             Date of Birth: 1955-12-08           MRN: [id number]             PCP: Charleston Poot, MD Referring: Charleston Poot, MD Visit Date: 10/12/2020 Occupation: @GUAROCC @  Subjective:  Other (Right shoulder pain )   History of Present Illness: Olivia Barnes is a 65 y.o. female with the history of uveitis and inflammatory arthritis.  She was seen last in September 2021.  At the time she was having pain and swelling in her left knee joint and left wrist joint.  She states the left wrist joint swelling resolved and she underwent left total knee replacement on August 16, 2020 by Dr. Ninfa Linden.  Her left knee joint is gradually recovering.  She states for the last 2 weeks she has been having pain and discomfort in her right shoulder.  None of the other joints are painful.  She was seen by Dr. Manuella Ghazi recently and was told that her eyes are doing better.  She states she continued her medications when she had her knee replacement  Activities of Daily Living:  Patient reports morning stiffness for all day.  Patient Reports nocturnal pain.  Difficulty dressing/grooming: Denies Difficulty climbing stairs: Reports Difficulty getting out of chair: Denies Difficulty using hands for taps, buttons, cutlery, and/or writing: Denies  Review of Systems  Constitutional: Negative for fatigue.  HENT: Negative for mouth sores, mouth dryness and nose dryness.   Eyes: Negative for pain, itching and dryness.  Respiratory: Negative for shortness of breath and difficulty breathing.   Cardiovascular: Negative for chest pain and palpitations.  Gastrointestinal: Negative for blood in stool, constipation and diarrhea.  Endocrine: Negative for increased urination.  Genitourinary: Negative for difficulty urinating.  Musculoskeletal: Positive for arthralgias, joint pain and morning stiffness. Negative for joint swelling, myalgias, muscle tenderness and myalgias.   Skin: Negative for color change, rash and redness.  Allergic/Immunologic: Negative for susceptible to infections.  Neurological: Negative for dizziness, numbness, headaches, memory loss and weakness.  Hematological: Positive for bruising/bleeding tendency.  Psychiatric/Behavioral: Negative for confusion.    PMFS History:  Patient Active Problem List   Diagnosis Date Noted  . Status post total left knee replacement 07/27/2020  . Degenerative arthritis of right knee 07/04/2020  . Neck muscle spasm 06/05/2020  . Unilateral primary osteoarthritis, left knee 05/22/2020  . Uveitis 01/11/2020  . Acute bursitis of right shoulder 12/06/2019  . Baker's cyst of knee, left 09/30/2019  . Ganglion of left knee 03/11/2019  . Tear of left hamstring 03/01/2019  . 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 Lumbar radiculopathy 03/20/2016  . Arthritis of right hip 10/20/2014  . Septic arthritis (Otho) 07/13/2014  . Transient synovitis of wrist 07/13/2014  . CN (constipation) 07/13/2014  . Essential hypertension 07/13/2014  . Primary osteoarthritis of right hip 07/13/2014    Past Medical History:  Diagnosis Date  . Arthritis   . Hyperlipemia   . Hypertension     Family History  Problem Relation Age of Onset  . Heart disease Mother   . Healthy Brother    Past Surgical History:  Procedure Laterality Date  . arm surgery Left    plate in left arm  . CYST EXCISION Left    knee   . 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 TOTAL  KNEE ARTHROPLASTY Left 07/27/2020   Procedure: LEFT TOTAL KNEE ARTHROPLASTY;  Surgeon: Mcarthur Rossetti, MD;  Location: [facility];  Service: Orthopedics;  Laterality: Left;   Social History   Social History Narrative  . Not on file   Immunization History  Administered Date(s) Administered  . PFIZER(Purple Top)SARS-COV-2 Vaccination 09/15/2019, 10/04/2019, 06/08/2020     Objective: Vital Signs:  BP (!) 148/83 (BP Location: Left Arm, Patient Position: Sitting, Cuff Size: Normal)   Pulse 65   Resp 15   Ht $R'5\' 3"'lX$  (1.6 m)   Wt 222 lb 6.4 oz (100.9 kg)   BMI 39.40 kg/m    Physical Exam Vitals and nursing note reviewed.  Constitutional:      Appearance: She is well-developed and well-nourished.  HENT:     Head: Normocephalic and atraumatic.  Eyes:     Extraocular Movements: EOM normal.     Conjunctiva/sclera: Conjunctivae normal.  Cardiovascular:     Rate and Rhythm: Normal rate and regular rhythm.     Pulses: Intact distal pulses.     Heart sounds: Normal heart sounds.  Pulmonary:     Effort: Pulmonary effort is normal.     Breath sounds: Normal breath sounds.  Abdominal:     General: Bowel sounds are normal.     Palpations: Abdomen is soft.  Musculoskeletal:     Cervical back: Normal range of motion.  Lymphadenopathy:     Cervical: No cervical adenopathy.  Skin:    General: Skin is warm and dry.     Capillary Refill: Capillary refill takes less than 2 seconds.  Neurological:     Mental Status: She is alert and oriented to person, place, and time.  Psychiatric:        Mood and Affect: Mood and affect normal.        Behavior: Behavior normal.      Musculoskeletal Exam: The spine was in good range of motion.  She had discomfort with abduction and internal rotation of her right shoulder joint.  She had full passive range of motion.  Left shoulder joint was in full range of motion.  Elbow joints, wrist joints, MCPs PIPs and DIPs with good range of motion with no synovitis.  Hip joints, knee joints, ankles, MTPs and PIPs with good range of motion with no synovitis.  Left knee joint was replaced with some warmth on palpation.  CDAI Exam: CDAI Score: - Patient Global: -; Provider Global: - Swollen: -; Tender: - Joint Exam 10/12/2020   No joint exam has been documented for this visit   There is currently no information documented on the homunculus. Go to the  Rheumatology activity and complete the homunculus joint exam.  Investigation: No additional findings.  Imaging: XR Shoulder Right  Result Date: 10/12/2020 No glenohumeral joint space narrowing was noted.  Acromioclavicular narrowing and spurring was noted.  Some spurring of the acromion was noted.  No chondrocalcinosis was noted. Impression: These findings are consistent with acromioclavicular arthritis and spurring of the acromion.   Recent Labs: Lab Results  Component Value Date   WBC 8.3 07/28/2020   HGB 10.9 (L) 07/28/2020   PLT 140 (L) 07/28/2020   NA 135 07/28/2020   K 4.3 07/28/2020   CL 103 07/28/2020   CO2 23 07/28/2020   GLUCOSE 145 (H) 07/28/2020   BUN 18 07/28/2020   CREATININE 0.63 07/28/2020   CALCIUM 8.5 (L) 07/28/2020   GFRAA  07/06/2010    >60  The eGFR has been calculated using the MDRD equation. This calculation has not been validated in all clinical situations. eGFR's persistently <60 mL/min signify possible Chronic Kidney Disease.    April 2021 labs done by Dr. Manuella Ghazi CBC normal, CMP normal, RF negative, anti-CCP negative, ACE negative, ANA negative, anti-DNase B-, Lyme negative, RPR negative, toxoplasmosis negative, TPA negative, proteinase 3 -, MPO negative, ANCA negative, TB Gold negative, SPEP negative, hepatitis A, hepatitis B, hepatitis C negative, HIV negative, HLA-B27 negative   Speciality Comments: No specialty comments available.  Procedures:  Large Joint Inj: R glenohumeral on 10/12/2020 8:34 AM Indications: pain Details: 27 G 1.5 in needle, posterior approach  Arthrogram: No  Medications: 40 mg triamcinolone acetonide 40 MG/ML; 1.5 mL lidocaine 1 % Aspirate: 0 mL Outcome: tolerated well, no immediate complications Procedure, treatment alternatives, risks and benefits explained, specific risks discussed. Consent was given by the patient. Immediately prior to procedure a time out was called to verify the correct patient, procedure,  equipment, support staff and site/side marked as required. Patient was prepped and draped in the usual sterile fashion.     Allergies: Gabapentin, Meloxicam, and Penicillins   Assessment / Plan:     Visit Diagnoses: Panuveitis of both eyes - Followed by Dr. Manuella Ghazi at [facility]..  According the patient her eye symptoms have been stable.  High risk medication use - CellCept 1500 mg p.o. twice daily, Humira 40 mg subcu every other week started July 2021.  Both medications are monitored by Dr. Manuella Ghazi.  Patient get labs from Dr. Trena Platt office.  Chronic right shoulder pain -she has been having pain and discomfort in her right shoulder joint for the last few weeks.  She is having difficulty raising her right arm.  Most likely her symptoms are related to the use of walker after the knee replacement.  Plan: XR Shoulder Right.  X-ray showed acromioclavicular arthritis and spurring of the acromion.  Her pain is mostly glenohumeral.  After informed consent was obtained in different side effects were discussed the right glenohumeral joint was injected with cortisone as described above.  Patient tolerated procedure well.  Postprocedure instructions were given and a handout on shoulder joint exercises was given.  Inflammatory arthritis - History of left knee joint and left wrist joint swelling.  All autoimmune work-up was negative.  Symptoms responded to immunosuppressive therapy.  Status post total knee replacement, left - August 16, 2020 by Dr. Ninfa Linden.  She is gradually recovering from it.  History of total hip replacement, bilateral - Chronic pain despite bilateral total hip replacement.  History of septic arthritis after hip replacement treated with antibiotics in the past.  Pain in both hands - History of left wrist joint swelling.  X-rays were consistent with osteoarthritis.  Lumbar radiculopathy-she continues to have some lower back pain.  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  Hypertensive retinopathy of both eyes  Essential hypertension-her blood pressure was slightly elevated today.  Have advised her to monitor blood pressure especially after the cortisone injection.  Dyslipidemia  Orders: Orders Placed This Encounter  Procedures  . XR Shoulder Right   No orders of the defined types were placed in this encounter.    Follow-Up Instructions: Return for Panuveitis and inflammatory arthritis.   Bo Merino, MD  Note - This record has been created using Editor, commissioning.  Chart creation errors have been sought, but may not always  have been located. Such creation errors do not reflect on  the  standard of medical care. 

## 2020-10-12 ENCOUNTER — Ambulatory Visit: Payer: Self-pay

## 2020-10-12 ENCOUNTER — Ambulatory Visit: Payer: 59 | Admitting: Rheumatology

## 2020-10-12 ENCOUNTER — Other Ambulatory Visit: Payer: Self-pay

## 2020-10-12 ENCOUNTER — Encounter: Payer: Self-pay | Admitting: Rheumatology

## 2020-10-12 VITALS — BP 148/83 | HR 65 | Resp 15 | Ht 63.0 in | Wt 222.4 lb

## 2020-10-12 DIAGNOSIS — M25511 Pain in right shoulder: Secondary | ICD-10-CM

## 2020-10-12 DIAGNOSIS — E785 Hyperlipidemia, unspecified: Secondary | ICD-10-CM

## 2020-10-12 DIAGNOSIS — M79641 Pain in right hand: Secondary | ICD-10-CM

## 2020-10-12 DIAGNOSIS — M7122 Synovial cyst of popliteal space [Baker], left knee: Secondary | ICD-10-CM

## 2020-10-12 DIAGNOSIS — H44113 Panuveitis, bilateral: Secondary | ICD-10-CM | POA: Diagnosis not present

## 2020-10-12 DIAGNOSIS — M2242 Chondromalacia patellae, left knee: Secondary | ICD-10-CM

## 2020-10-12 DIAGNOSIS — M1990 Unspecified osteoarthritis, unspecified site: Secondary | ICD-10-CM | POA: Diagnosis not present

## 2020-10-12 DIAGNOSIS — Z79899 Other long term (current) drug therapy: Secondary | ICD-10-CM

## 2020-10-12 DIAGNOSIS — Z8669 Personal history of other diseases of the nervous system and sense organs: Secondary | ICD-10-CM

## 2020-10-12 DIAGNOSIS — I10 Essential (primary) hypertension: Secondary | ICD-10-CM

## 2020-10-12 DIAGNOSIS — G8929 Other chronic pain: Secondary | ICD-10-CM

## 2020-10-12 DIAGNOSIS — M5416 Radiculopathy, lumbar region: Secondary | ICD-10-CM

## 2020-10-12 DIAGNOSIS — Z96643 Presence of artificial hip joint, bilateral: Secondary | ICD-10-CM

## 2020-10-12 DIAGNOSIS — Z96652 Presence of left artificial knee joint: Secondary | ICD-10-CM

## 2020-10-12 DIAGNOSIS — H35033 Hypertensive retinopathy, bilateral: Secondary | ICD-10-CM

## 2020-10-12 DIAGNOSIS — M79642 Pain in left hand: Secondary | ICD-10-CM

## 2020-10-12 MED ORDER — TRIAMCINOLONE ACETONIDE 40 MG/ML IJ SUSP
40.0000 mg | INTRAMUSCULAR | Status: AC | PRN
  Administered 2020-10-12: 40 mg via INTRA_ARTICULAR

## 2020-10-12 MED ORDER — LIDOCAINE HCL 1 % IJ SOLN
1.5000 mL | INTRAMUSCULAR | Status: AC | PRN
  Administered 2020-10-12: 1.5 mL

## 2020-10-12 NOTE — Patient Instructions (Signed)
Shoulder Exercises Ask your health care provider which exercises are safe for you. Do exercises exactly as told by your health care provider and adjust them as directed. It is normal to feel mild stretching, pulling, tightness, or discomfort as you do these exercises. Stop right away if you feel sudden pain or your pain gets worse. Do not begin these exercises until told by your health care provider. Stretching exercises External rotation and abduction This exercise is sometimes called corner stretch. This exercise rotates your arm outward (external rotation) and moves your arm out from your body (abduction). 1. Stand in a doorway with one of your feet slightly in front of the other. This is called a staggered stance. If you cannot reach your forearms to the door frame, stand facing a corner of a room. 2. Choose one of the following positions as told by your health care provider: ? Place your hands and forearms on the door frame above your head. ? Place your hands and forearms on the door frame at the height of your head. ? Place your hands on the door frame at the height of your elbows. 3. Slowly move your weight onto your front foot until you feel a stretch across your chest and in the front of your shoulders. Keep your head and chest upright and keep your abdominal muscles tight. 4. Hold for __________ seconds. 5. To release the stretch, shift your weight to your back foot. Repeat __________ times. Complete this exercise __________ times a day.   Extension, standing 1. Stand and hold a broomstick, a cane, or a similar object behind your back. ? Your hands should be a little wider than shoulder width apart. ? Your palms should face away from your back. 2. Keeping your elbows straight and your shoulder muscles relaxed, move the stick away from your body until you feel a stretch in your shoulders (extension). ? Avoid shrugging your shoulders while you move the stick. Keep your shoulder blades  tucked down toward the middle of your back. 3. Hold for __________ seconds. 4. Slowly return to the starting position. Repeat __________ times. Complete this exercise __________ times a day. Range-of-motion exercises Pendulum 1. Stand near a wall or a surface that you can hold onto for balance. 2. Bend at the waist and let your left / right arm hang straight down. Use your other arm to support you. Keep your back straight and do not lock your knees. 3. Relax your left / right arm and shoulder muscles, and move your hips and your trunk so your left / right arm swings freely. Your arm should swing because of the motion of your body, not because you are using your arm or shoulder muscles. 4. Keep moving your hips and trunk so your arm swings in the following directions, as told by your health care provider: ? Side to side. ? Forward and backward. ? In clockwise and counterclockwise circles. 5. Continue each motion for __________ seconds, or for as long as told by your health care provider. 6. Slowly return to the starting position. Repeat __________ times. Complete this exercise __________ times a day.   Shoulder flexion, standing 1. Stand and hold a broomstick, a cane, or a similar object. Place your hands a little more than shoulder width apart on the object. Your left / right hand should be palm up, and your other hand should be palm down. 2. Keep your elbow straight and your shoulder muscles relaxed. Push the stick up with your healthy   arm to raise your left / right arm in front of your body, and then over your head until you feel a stretch in your shoulder (flexion). ? Avoid shrugging your shoulder while you raise your arm. Keep your shoulder blade tucked down toward the middle of your back. 3. Hold for __________ seconds. 4. Slowly return to the starting position. Repeat __________ times. Complete this exercise __________ times a day.   Shoulder abduction, standing 1. Stand and hold a  broomstick, a cane, or a similar object. Place your hands a little more than shoulder width apart on the object. Your left / right hand should be palm up, and your other hand should be palm down. 2. Keep your elbow straight and your shoulder muscles relaxed. Push the object across your body toward your left / right side. Raise your left / right arm to the side of your body (abduction) until you feel a stretch in your shoulder. ? Do not raise your arm above shoulder height unless your health care provider tells you to do that. ? If directed, raise your arm over your head. ? Avoid shrugging your shoulder while you raise your arm. Keep your shoulder blade tucked down toward the middle of your back. 3. Hold for __________ seconds. 4. Slowly return to the starting position. Repeat __________ times. Complete this exercise __________ times a day. Internal rotation 1. Place your left / right hand behind your back, palm up. 2. Use your other hand to dangle an exercise band, a towel, or a similar object over your shoulder. Grasp the band with your left / right hand so you are holding on to both ends. 3. Gently pull up on the band until you feel a stretch in the front of your left / right shoulder. The movement of your arm toward the center of your body is called internal rotation. ? Avoid shrugging your shoulder while you raise your arm. Keep your shoulder blade tucked down toward the middle of your back. 4. Hold for __________ seconds. 5. Release the stretch by letting go of the band and lowering your hands. Repeat __________ times. Complete this exercise __________ times a day.   Strengthening exercises External rotation 1. Sit in a stable chair without armrests. 2. Secure an exercise band to a stable object at elbow height on your left / right side. 3. Place a soft object, such as a folded towel or a small pillow, between your left / right upper arm and your body to move your elbow about 4 inches (10  cm) away from your side. 4. Hold the end of the exercise band so it is tight and there is no slack. 5. Keeping your elbow pressed against the soft object, slowly move your forearm out, away from your abdomen (external rotation). Keep your body steady so only your forearm moves. 6. Hold for __________ seconds. 7. Slowly return to the starting position. Repeat __________ times. Complete this exercise __________ times a day.   Shoulder abduction 1. Sit in a stable chair without armrests, or stand up. 2. Hold a __________ weight in your left / right hand, or hold an exercise band with both hands. 3. Start with your arms straight down and your left / right palm facing in, toward your body. 4. Slowly lift your left / right hand out to your side (abduction). Do not lift your hand above shoulder height unless your health care provider tells you that this is safe. ? Keep your arms straight. ? Avoid   shrugging your shoulder while you do this movement. Keep your shoulder blade tucked down toward the middle of your back. 5. Hold for __________ seconds. 6. Slowly lower your arm, and return to the starting position. Repeat __________ times. Complete this exercise __________ times a day.   Shoulder extension 1. Sit in a stable chair without armrests, or stand up. 2. Secure an exercise band to a stable object in front of you so it is at shoulder height. 3. Hold one end of the exercise band in each hand. Your palms should face each other. 4. Straighten your elbows and lift your hands up to shoulder height. 5. Step back, away from the secured end of the exercise band, until the band is tight and there is no slack. 6. Squeeze your shoulder blades together as you pull your hands down to the sides of your thighs (extension). Stop when your hands are straight down by your sides. Do not let your hands go behind your body. 7. Hold for __________ seconds. 8. Slowly return to the starting position. Repeat __________  times. Complete this exercise __________ times a day. Shoulder row 1. Sit in a stable chair without armrests, or stand up. 2. Secure an exercise band to a stable object in front of you so it is at waist height. 3. Hold one end of the exercise band in each hand. Position your palms so that your thumbs are facing the ceiling (neutral position). 4. Bend each of your elbows to a 90-degree angle (right angle) and keep your upper arms at your sides. 5. Step back until the band is tight and there is no slack. 6. Slowly pull your elbows back behind you. 7. Hold for __________ seconds. 8. Slowly return to the starting position. Repeat __________ times. Complete this exercise __________ times a day. Shoulder press-ups 1. Sit in a stable chair that has armrests. Sit upright, with your feet flat on the floor. 2. Put your hands on the armrests so your elbows are bent and your fingers are pointing forward. Your hands should be about even with the sides of your body. 3. Push down on the armrests and use your arms to lift yourself off the chair. Straighten your elbows and lift yourself up as much as you comfortably can. ? Move your shoulder blades down, and avoid letting your shoulders move up toward your ears. ? Keep your feet on the ground. As you get stronger, your feet should support less of your body weight as you lift yourself up. 4. Hold for __________ seconds. 5. Slowly lower yourself back into the chair. Repeat __________ times. Complete this exercise __________ times a day.   Wall push-ups 1. Stand so you are facing a stable wall. Your feet should be about one arm-length away from the wall. 2. Lean forward and place your palms on the wall at shoulder height. 3. Keep your feet flat on the floor as you bend your elbows and lean forward toward the wall. 4. Hold for __________ seconds. 5. Straighten your elbows to push yourself back to the starting position. Repeat __________ times. Complete this  exercise __________ times a day.   This information is not intended to replace advice given to you by your health care provider. Make sure you discuss any questions you have with your health care provider. Document Revised: 12/03/2018 Document Reviewed: 09/10/2018 Elsevier Patient Education  2021 [employer]  

## 2020-11-14 NOTE — Progress Notes (Signed)
[name] [address] [place] [address] Phone: [phone] Subjective:   Olivia Barnes, am serving as a scribe for Dr. Antoine Primas. This visit occurred during the SARS-CoV-2 public health emergency.  Safety protocols were in place, including screening questions prior to the visit, additional usage of staff PPE, and extensive cleaning of exam room while observing appropriate contact time as indicated for disinfecting solutions.   I'm seeing this patient by the request  of:  [name], MD  CC: Bilateral hip pain, right knee discomfort  [id number]   09/03/2020 Patient's knee is doing relatively well.  Did have a replacement on the left side.  I am expecting patient to do better overall.  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  Encouraged her to continue to work with physical therapy and follow-up with me as needed  Update 11/14/2020 Olivia Barnes is a 65 y.o. female coming in with complaint of right knee and right hip pain. Pain over medial aspect and states that she has 2 knots over lateral aspect of knee for past few weeks.   Also having lumbar spine and bilateral hip pain that can radiate into the groin bilaterally.  Patient states that it is moderate to severe.  Patient denies any trouble dysuria, any fevers chills any abnormal weight gain or weight loss.      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    TOTAL KNEE ARTHROPLASTY Left 07/27/2020   Procedure: LEFT TOTAL KNEE ARTHROPLASTY;  Surgeon: Kathryne Hitch, MD;  Location: [facility];  Service: Orthopedics;  Laterality: Left;    Social History   Socioeconomic History   Marital status: Married    Spouse name: Not on file   Number of children: Not on file   Years of education: Not on file   Highest education level: Not on file  Occupational History   Not on file  Tobacco Use   Smoking status: Never Smoker   Smokeless tobacco: Never Used  Vaping Use   Vaping Use: Never used  Substance and Sexual Activity   Alcohol use: Never    Alcohol/week: 0.0 standard drinks   Drug use: Never   Sexual activity: Not on file  Other Topics Concern   Not on file  Social History Narrative   Not on file   Social Determinants of Health   Financial Resource Strain: Not on file  Food Insecurity: Not on file  Transportation Needs: Not on file  Physical Activity: Not on file  Stress: Not on file  Social Connections: Not on file   Allergies  Allergen Reactions   Gabapentin Nausea And Vomiting   Meloxicam    Penicillins Rash   Family History  Problem Relation Age of Onset   Heart disease Mother    Healthy Brother      Current Outpatient Medications (Cardiovascular):    losartan-hydrochlorothiazide (HYZAAR) 100-12.5 MG tablet, Take 1 tablet by mouth daily.   rosuvastatin (CRESTOR) 20 MG tablet, Take 20 mg by mouth daily.   Current Outpatient Medications (Analgesics):  Adalimumab (HUMIRA) 40 MG/0.4ML PSKT, Inject 40 mg into the skin every 14 (fourteen) days.    Current Outpatient Medications (Other):    bimatoprost (LUMIGAN) 0.03 % ophthalmic solution, Place 1 drop into both eyes at bedtime.    COMBIGAN 0.2-0.5 % ophthalmic solution, Place 1 drop into both eyes 2 (two) times daily.    Multiple Vitamin (MULTIVITAMIN PO), Take 1 tablet by mouth daily.    mycophenolate (CELLCEPT) 500 MG tablet, Take 1,500 mg by mouth 2 (two) times daily.    polyethylene glycol powder (GLYCOLAX/MIRALAX) 17 GM/SCOOP powder, Take 1 Container by mouth daily.    tacrolimus (PROGRAF) 1 MG capsule, Take 2  mg by mouth 2 (two) times daily.    terbinafine (LAMISIL) 250 MG tablet, Take 250 mg by mouth daily.   Reviewed prior external information including notes and imaging from  primary care provider As well as notes that were available from care everywhere and other healthcare systems.  Past medical history, social, surgical and family history all reviewed in electronic medical record.  No pertanent information unless stated regarding to the chief complaint.   Review of Systems:  No headache, visual changes, nausea, vomiting, diarrhea, constipation, dizziness, abdominal pain, skin rash, fevers, chills, night sweats, weight loss, swollen lymph nodes, body aches, joint swelling, chest pain, shortness of breath, mood changes. POSITIVE muscle aches  Objective  Blood pressure 132/78, pulse (!) 57, height 5\' 3"  (1.6 m), weight 217 lb (98.4 kg), SpO2 98 %.   General: No apparent distress alert and oriented x3 mood and affect normal, dressed appropriately.  HEENT: Pupils equal, extraocular movements intact  Respiratory: Patient's speak in full sentences and does not appear short of breath  Cardiovascular: No lower extremity edema, non tender, no erythema  Gait antalgic MSK:  Right knee exam does have some arthritic changes but patient has very minimal instability noted today.  Patient is pointing more towards the tibial tuberosity but no significant abnormality noted.  Patient has decent range of motion only lacking the last 5 degrees of flexion.  Back exam does show the patient does have loss lordosis.  Mild tightness of the hips bilaterally.  Patient does have tightness of the hip flexors.  Patient minimally tender to palpation in the lumbar spine but all seems to be in the muscular aspect.  Limited musculoskeletal ultrasound of patient's right knee appears to be fairly unremarkable with no significant hypoechoic changes but does have the moderate arthritic changes noted.  With patient's has the  bump seems to be more the tibial tuberosity.      Impression and Recommendations:     The above documentation has been reviewed and is accurate and complete , DO

## 2020-11-15 ENCOUNTER — Ambulatory Visit: Payer: 59 | Admitting: Family Medicine

## 2020-11-15 ENCOUNTER — Ambulatory Visit: Payer: Self-pay

## 2020-11-15 ENCOUNTER — Ambulatory Visit (INDEPENDENT_AMBULATORY_CARE_PROVIDER_SITE_OTHER): Payer: 59

## 2020-11-15 ENCOUNTER — Encounter: Payer: Self-pay | Admitting: Family Medicine

## 2020-11-15 ENCOUNTER — Other Ambulatory Visit: Payer: Self-pay

## 2020-11-15 VITALS — BP 132/78 | HR 57 | Ht 63.0 in | Wt 217.0 lb

## 2020-11-15 DIAGNOSIS — M5416 Radiculopathy, lumbar region: Secondary | ICD-10-CM

## 2020-11-15 DIAGNOSIS — M25551 Pain in right hip: Secondary | ICD-10-CM | POA: Diagnosis not present

## 2020-11-15 DIAGNOSIS — M25561 Pain in right knee: Secondary | ICD-10-CM

## 2020-11-15 DIAGNOSIS — M1711 Unilateral primary osteoarthritis, right knee: Secondary | ICD-10-CM

## 2020-11-15 DIAGNOSIS — G8929 Other chronic pain: Secondary | ICD-10-CM | POA: Diagnosis not present

## 2020-11-15 DIAGNOSIS — M25559 Pain in unspecified hip: Secondary | ICD-10-CM

## 2020-11-15 DIAGNOSIS — Z96643 Presence of artificial hip joint, bilateral: Secondary | ICD-10-CM

## 2020-11-15 DIAGNOSIS — M25552 Pain in left hip: Secondary | ICD-10-CM | POA: Diagnosis not present

## 2020-11-15 DIAGNOSIS — G8928 Other chronic postprocedural pain: Secondary | ICD-10-CM

## 2020-11-15 IMAGING — DX DG PELVIS 1-2V
2 series · 2 of 2 positions shown · non-contrast
Comparison: [DATE]

CLINICAL DATA: Bilateral hip pain.

EXAM:
PELVIS - 1-2 VIEW

[pelvis ap (1 of 2)]
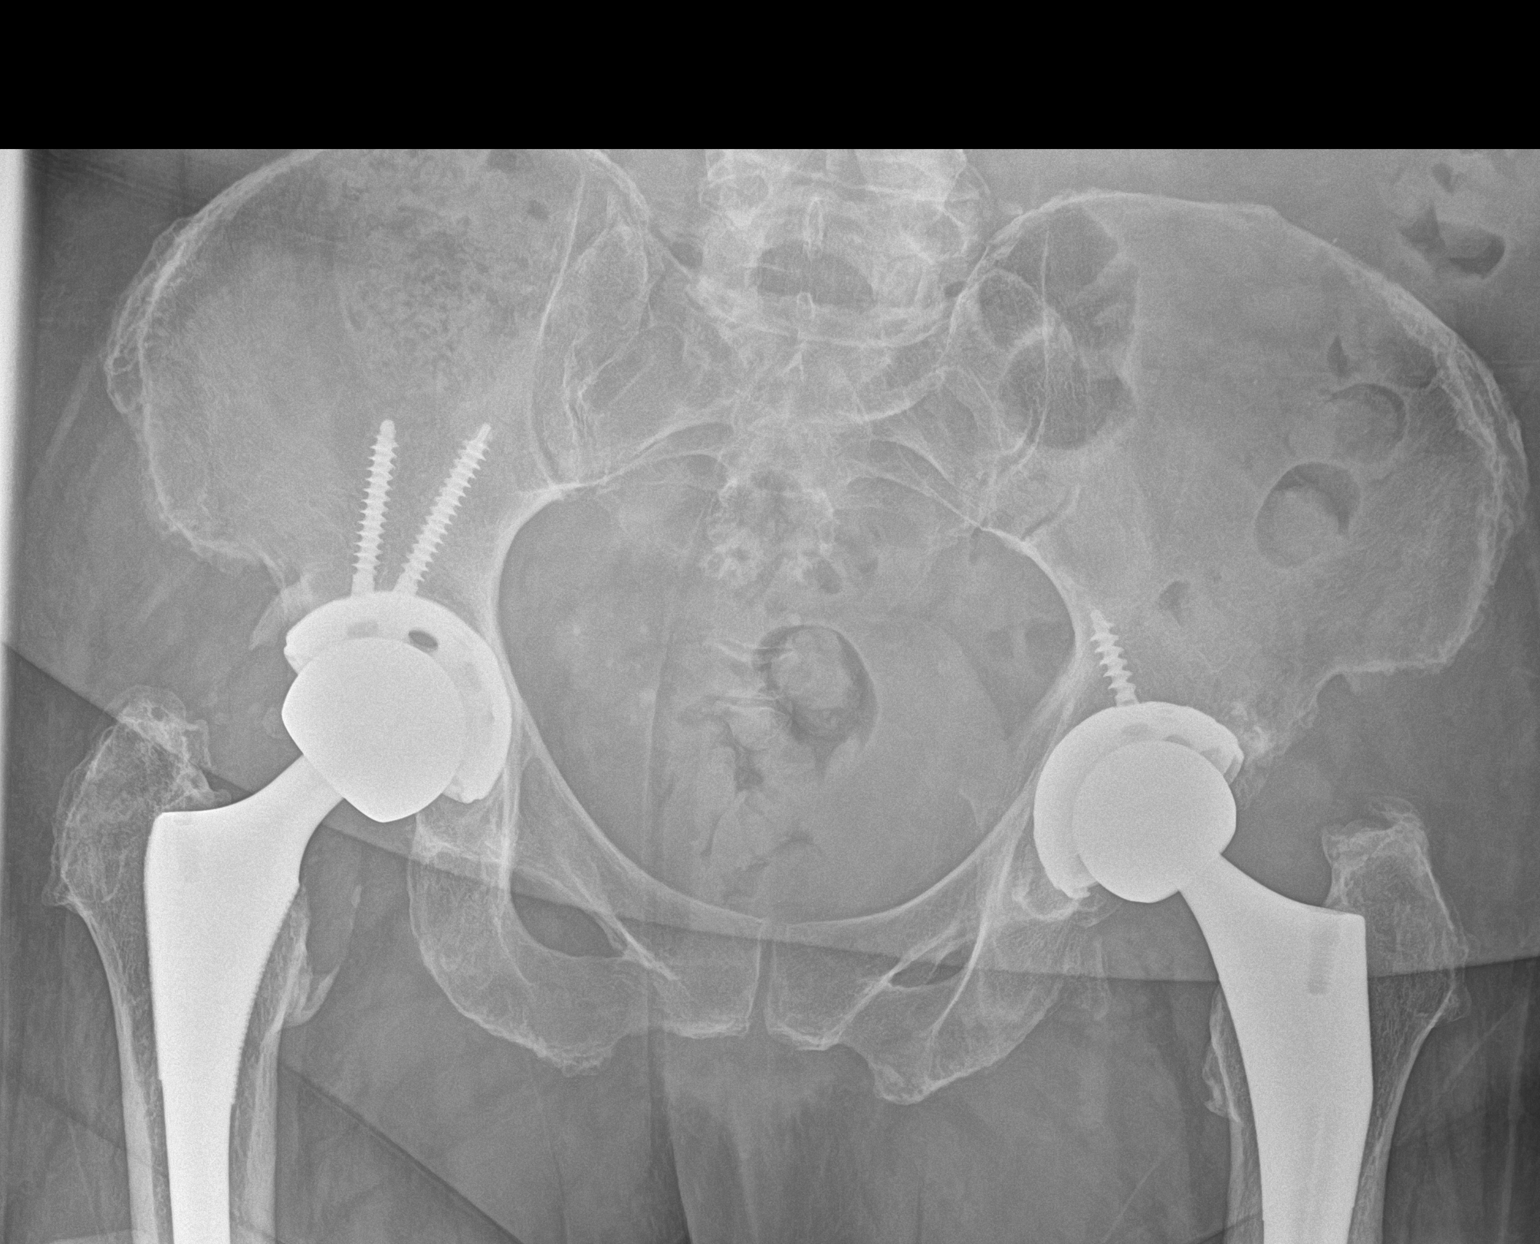

[pelvis ap (2 of 2)]
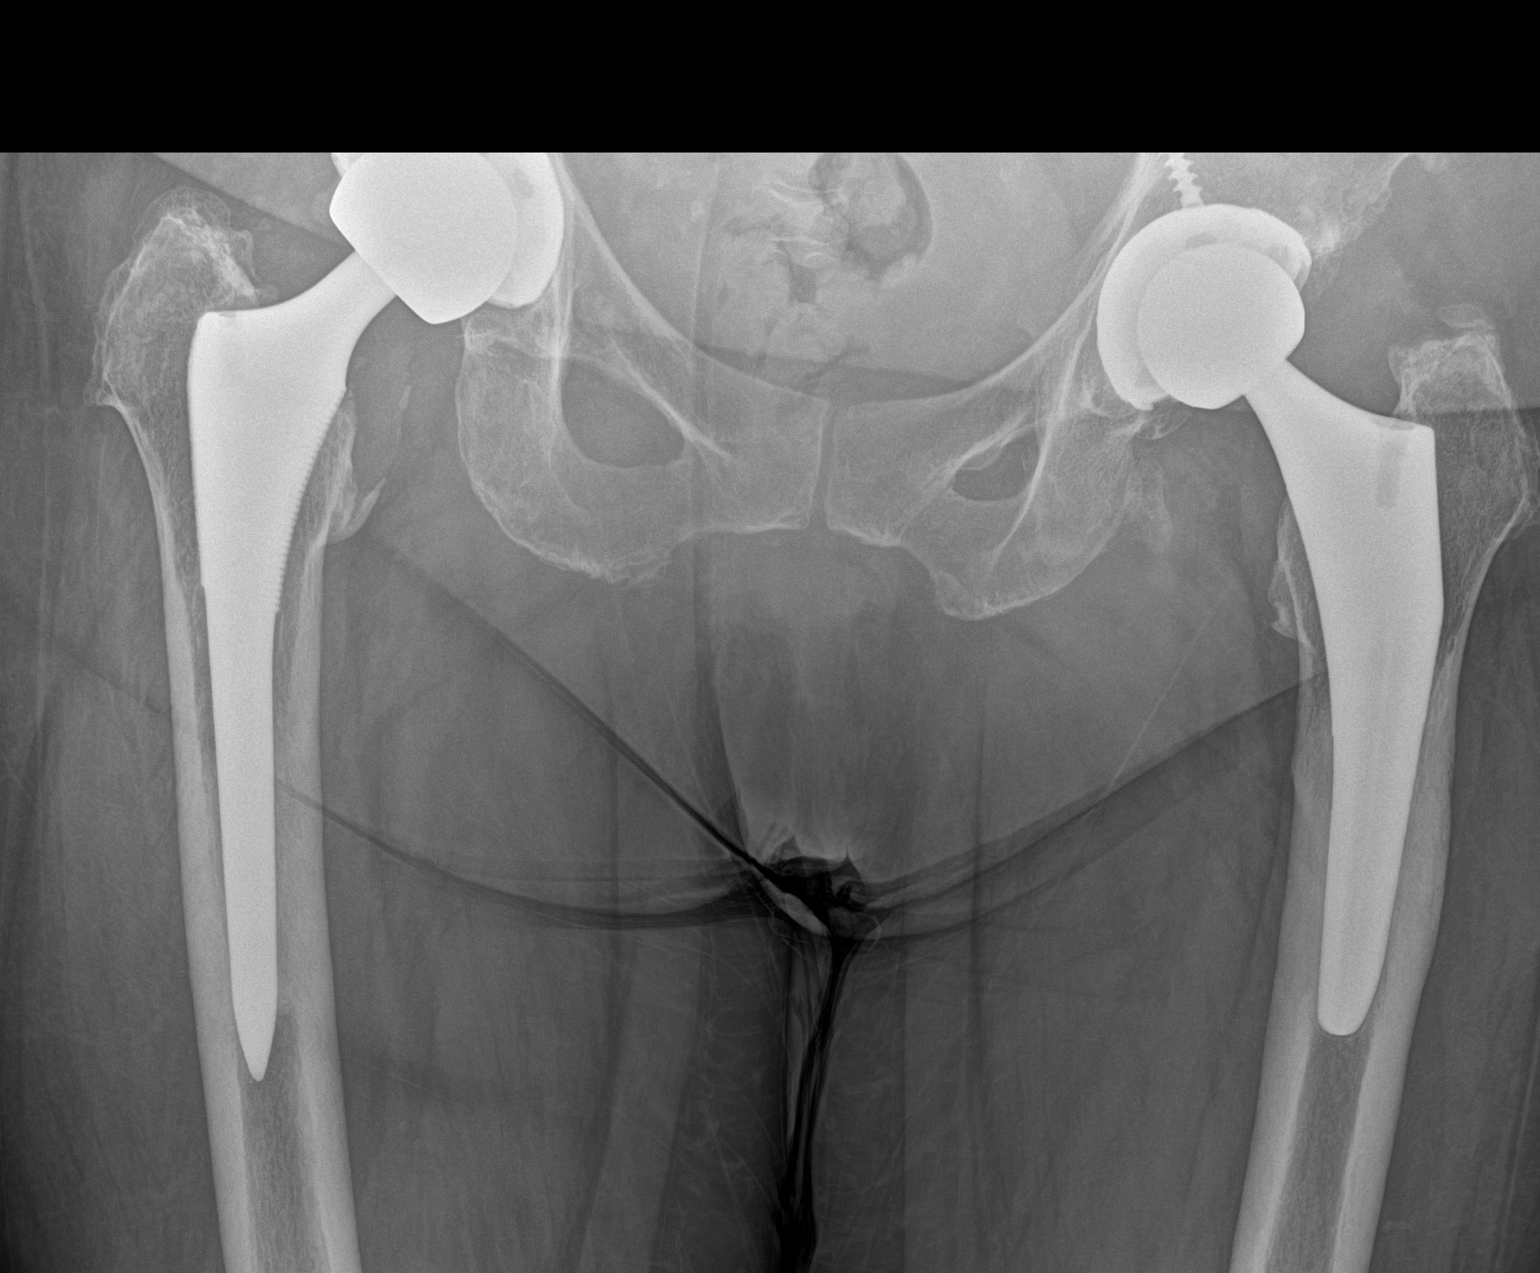

[2 of 2 positions shown; findings below may reference images not displayed]

FINDINGS: Status post bilateral hip replacement. No evidence for hardware
complication. No fracture. No worrisome lytic or sclerotic osseous
abnormality. SI joints and symphysis pubis unremarkable.
IMPRESSION: No acute abnormality. No findings to explain the patient's history
of pain.

## 2020-11-15 NOTE — Patient Instructions (Signed)
Knee looks ok Pelvis xray Exercises  Pennsaid 2x a day Hold on injection for now Check in 6 weeks

## 2020-11-15 NOTE — Assessment & Plan Note (Signed)
Patient could be potentially having more back pain that could be contributing to some of the bilateral hip pain.  Does seem to have significant tightness of the hip flexors.  Given exercises as well.  We will consider the possibility of further imaging if necessary.  Patient should do relatively well as well.  Discussed the exercises core strengthening.  Follow-up again in 6 weeks

## 2020-11-15 NOTE — Assessment & Plan Note (Signed)
Patient is doing relatively well.  Was concerned about significant bumps that she had on the knee.  Seems to be more of the tibial tuberosity.  I think patient though is doing better.  On ultrasound today significant decrease in inflammation.  Still has the mild to moderate narrowing noted but nothing severe.  Follow-up with me again 6 to 8 weeks.

## 2020-11-15 NOTE — Assessment & Plan Note (Signed)
Patient is having some mild increase in pain in the medial aspect of the hip.  This has been since patient had the knee replacement.  We will get x-rays just to make sure patient does not have any significant loosening noted.  Patient does have some limited internal range of motion especially on the left.  We discussed though that there is could be more signs secondary to inflammation.  Continue to have trouble potential CT abdomen pelvis could be beneficial but will be difficult with the hip replacements.  Also could consider the possibility of a bone scan.  My hope is though patient will continue to make improvement.  Follow-up with me again 6 to 8 weeks

## 2020-12-23 HISTORY — PX: TOENAIL EXCISION: SUR558

## 2020-12-26 NOTE — Progress Notes (Signed)
[name] [address] [place] [address] Phone: [phone] Subjective:   [name], am serving as a scribe for Dr. Antoine Primas. This visit occurred during the SARS-CoV-2 public health emergency.  Safety protocols were in place, including screening questions prior to the visit, additional usage of staff PPE, and extensive cleaning of exam room while observing appropriate contact time as indicated for disinfecting solutions.   I'm seeing this patient by the request  of:  [name], MD  CC: Shoulder pain and hip pain  MCN:[id number]   11/15/2020 Patient could be potentially having more back pain that could be contributing to some of the bilateral hip pain.  Does seem to have significant tightness of the hip flexors.  Given exercises as well.  We will consider the possibility of further imaging if necessary.  Patient should do relatively well as well.  Discussed the exercises core strengthening.  Follow-up again in 6 weeks  Patient is having some mild increase in pain in the medial aspect of the hip.  This has been since patient had the knee replacement.  We will get x-rays just to make sure patient does not have any significant loosening noted.  Patient does have some limited internal range of motion especially on the left.  We discussed though that there is could be more signs secondary to inflammation.  Continue to have trouble potential CT abdomen pelvis could be beneficial but will be difficult with the hip replacements.  Also could consider the possibility of a bone scan.  My hope is though patient will continue to make improvement.  Follow-up with me again 6 to 8 weeks  Patient is doing relatively well.  Was concerned about significant bumps that she had on the knee.  Seems to be more of the tibial tuberosity.  I think patient though is doing better.  On ultrasound today significant decrease in inflammation.  Still has the mild to moderate  narrowing noted but nothing severe.  Follow-up with me again 6 to 8 weeks.  Update 12/27/2020 Demiah Gullickson is a 65 y.o. female coming in with complaint of B hip, LBP, and R knee pain. Patient states that her right shoulder pain has been increasing recently. Had injection previously in shoulder that helped.   Pain in B hips with walking over greater trochanter. Taking IBU which helps sometimes. Denies any radicular symptoms for either area.   Xray pelvis 11/15/2020 IMPRESSION: No acute abnormality. No findings to explain the patient's history of pain.  Patient states that it hurts to give her trouble.  Seems to be on the lateral aspect of the hips bilaterally.  Patient denies any instability but having chronic discomfort and pain.    Past Medical History:  Diagnosis Date  . Arthritis   . Hyperlipemia   . Hypertension    Past Surgical History:  Procedure Laterality Date  . arm surgery Left    plate in left arm  . CYST EXCISION Left    knee   . 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 Marital status: Married    Spouse name: Not on file  . Number of children: Not on file  . Years of education: Not on file  . Highest education  level: Not on file  Occupational History  . Not on file  Tobacco Use  . Smoking status: Never Smoker  . Smokeless tobacco: Never Used  Vaping Use  . Vaping Use: Never used  Substance and Sexual Activity  . Alcohol use: Never    Alcohol/week: 0.0 standard drinks  . Drug use: Never  . Sexual activity: Not on file  Other Topics Concern  . Not on file  Social History Narrative  . Not on file   Social Determinants of Health   Financial Resource Strain: Not on file  Food Insecurity: Not on file   Transportation Needs: Not on file  Physical Activity: Not on file  Stress: Not on file  Social Connections: Not on file   Allergies  Allergen Reactions  . Gabapentin Nausea And Vomiting  . Meloxicam   . Penicillins Rash   Family History  Problem Relation Age of Onset  . Heart disease Mother   . Healthy Brother      Current Outpatient Medications (Cardiovascular):  .  losartan-hydrochlorothiazide (HYZAAR) 100-12.5 MG tablet, Take 1 tablet by mouth daily. .  rosuvastatin (CRESTOR) 20 MG tablet, Take 20 mg by mouth daily.   Current Outpatient Medications (Analgesics):  [name]  Adalimumab (HUMIRA) 40 MG/0.4ML PSKT, Inject 40 mg into the skin every 14 (fourteen) days.    Current Outpatient Medications (Other):  .  bimatoprost (LUMIGAN) 0.03 % ophthalmic solution, Place 1 drop into both eyes at bedtime.  .  COMBIGAN 0.2-0.5 % ophthalmic solution, Place 1 drop into both eyes 2 (two) times daily.  .  Multiple Vitamin (MULTIVITAMIN PO), Take 1 tablet by mouth daily.  .  mycophenolate (CELLCEPT) 500 MG tablet, Take 1,500 mg by mouth 2 (two) times daily.  .  polyethylene glycol powder (GLYCOLAX/MIRALAX) 17 GM/SCOOP powder, Take 1 Container by mouth daily.  .  tacrolimus (PROGRAF) 1 MG capsule, Take 2 mg by mouth 2 (two) times daily.  [name]  terbinafine (LAMISIL) 250 MG tablet, Take 250 mg by mouth daily.   Reviewed prior external information including notes and imaging from  primary care provider As well as notes that were available from care everywhere and other healthcare systems.  Past medical history, social, surgical and family history all reviewed in electronic medical record.  No pertanent information unless stated regarding to the chief complaint.   Review of Systems:  No headache, visual changes, nausea, vomiting, diarrhea, constipation, dizziness, abdominal pain, skin rash, fevers, chills, night sweats, weight loss, swollen lymph nodes, body aches, joint swelling, chest pain,  shortness of breath, mood changes. POSITIVE muscle aches  Objective  Blood pressure 124/78, pulse 71, height 5\' 3"  (1.6 m), weight 216 lb (98 kg), SpO2 99 %.   General: No apparent distress alert and oriented x3 mood and affect normal, dressed appropriately.  HEENT: Pupils equal, extraocular movements intact  Respiratory: Patient's speak in full sentences and does not appear short of breath  Cardiovascular: No lower extremity edema, non tender, no erythema  MSK: Patient does have some arthritic changes.  Moving slowly.  Mild antalgic gait noted. Patient is tender to palpation over the lateral aspect of the hips bilaterally right greater than left.  Patient though does not have any true instability of the hips noted.  Trace effusion noted of the left knee replacement.  No warmth noted.  Right shoulder exam does have positive impingement noted.  Positive crossover test also noted.  Procedure: Real-time Ultrasound Guided Injection of right glenohumeral joint Device: GE Logiq Q7  Ultrasound guided injection is preferred based studies that show increased duration, increased effect, greater accuracy, decreased procedural pain, increased response rate with ultrasound guided versus blind injection.  Verbal informed consent obtained.  Time-out conducted.  Noted no overlying erythema, induration, or other signs of local infection.  Skin prepped in a sterile fashion.  Local anesthesia: Topical Ethyl chloride.  With sterile technique and under real time ultrasound guidance:  Joint visualized.  23g 1  inch needle inserted posterior approach. Pictures taken for needle placement. Patient did have injection of 2 cc of 1% lidocaine, 2 cc of 0.5% Marcaine, and 1.0 cc of Kenalog 40 mg/dL. Completed without difficulty  Pain immediately resolved suggesting accurate placement of the medication.  Advised to call if fevers/chills, erythema, induration, drainage, or persistent bleeding.  Impression: Technically  successful ultrasound guided injection.  Procedure: Real-time Ultrasound Guided Injection of right acromioclavicular joint Device: GE Logiq Q7 Ultrasound guided injection is preferred based studies that show increased duration, increased effect, greater accuracy, decreased procedural pain, increased response rate, and decreased cost with ultrasound guided versus blind injection.  Verbal informed consent obtained.  Time-out conducted.  Noted no overlying erythema, induration, or other signs of local infection.  Skin prepped in a sterile fashion.  Local anesthesia: Topical Ethyl chloride.  With sterile technique and under real time ultrasound guidance: With a 25-gauge half inch needle injected with 0.5 cc of 0.5% Marcaine and 0.5 cc of Kenalog 40 mg/mL Completed without difficulty  Pain immediately resolved suggesting accurate placement of the medication.  Advised to call if fevers/chills, erythema, induration, drainage, or persistent bleeding.  Impression: Technically successful ultrasound guided injection.   Procedure: Real-time Ultrasound Guided Injection of right greater trochanteric bursitis secondary to patient's body habitus Device: GE Logiq Q7 Ultrasound guided injection is preferred based studies that show increased duration, increased effect, greater accuracy, decreased procedural pain, increased response rate, and decreased cost with ultrasound guided versus blind injection.  Verbal informed consent obtained.  Time-out conducted.  Noted no overlying erythema, induration, or other signs of local infection.  Skin prepped in a sterile fashion.  Local anesthesia: Topical Ethyl chloride.  With sterile technique and under real time ultrasound guidance:  Greater trochanteric area was visualized and patient's bursa was noted. A 22-gauge 3 inch needle was inserted and 4 cc of 0.5% Marcaine and 1 cc of Kenalog 40 mg/dL was injected. Pictures taken standing away from replacement more  superficial. Completed without difficulty  Pain immediately improved suggesting accurate placement of the medication.  Advised to call if fevers/chills, erythema, induration, drainage, or persistent bleeding.  Impression: Technically successful ultrasound guided injection.    Impression and Recommendations:     The above documentation has been reviewed and is accurate and complete Judi Saa, DO

## 2020-12-27 ENCOUNTER — Other Ambulatory Visit: Payer: Self-pay

## 2020-12-27 ENCOUNTER — Ambulatory Visit: Payer: Self-pay

## 2020-12-27 ENCOUNTER — Encounter: Payer: Self-pay | Admitting: Family Medicine

## 2020-12-27 ENCOUNTER — Ambulatory Visit (INDEPENDENT_AMBULATORY_CARE_PROVIDER_SITE_OTHER): Payer: 59

## 2020-12-27 ENCOUNTER — Ambulatory Visit: Payer: 59 | Admitting: Family Medicine

## 2020-12-27 VITALS — BP 124/78 | HR 71 | Ht 63.0 in | Wt 216.0 lb

## 2020-12-27 DIAGNOSIS — M19019 Primary osteoarthritis, unspecified shoulder: Secondary | ICD-10-CM | POA: Insufficient documentation

## 2020-12-27 DIAGNOSIS — M7551 Bursitis of right shoulder: Secondary | ICD-10-CM

## 2020-12-27 DIAGNOSIS — M7061 Trochanteric bursitis, right hip: Secondary | ICD-10-CM | POA: Diagnosis not present

## 2020-12-27 DIAGNOSIS — M25552 Pain in left hip: Secondary | ICD-10-CM

## 2020-12-27 DIAGNOSIS — M25551 Pain in right hip: Secondary | ICD-10-CM

## 2020-12-27 DIAGNOSIS — M19011 Primary osteoarthritis, right shoulder: Secondary | ICD-10-CM | POA: Diagnosis not present

## 2020-12-27 IMAGING — DX DG LUMBAR SPINE 2-3V
3 series · 3 of 3 positions shown · non-contrast
Comparison: None.

CLINICAL DATA: Chronic pain.

EXAM:
LUMBAR SPINE - 2-3 VIEW

[l-spine ap]
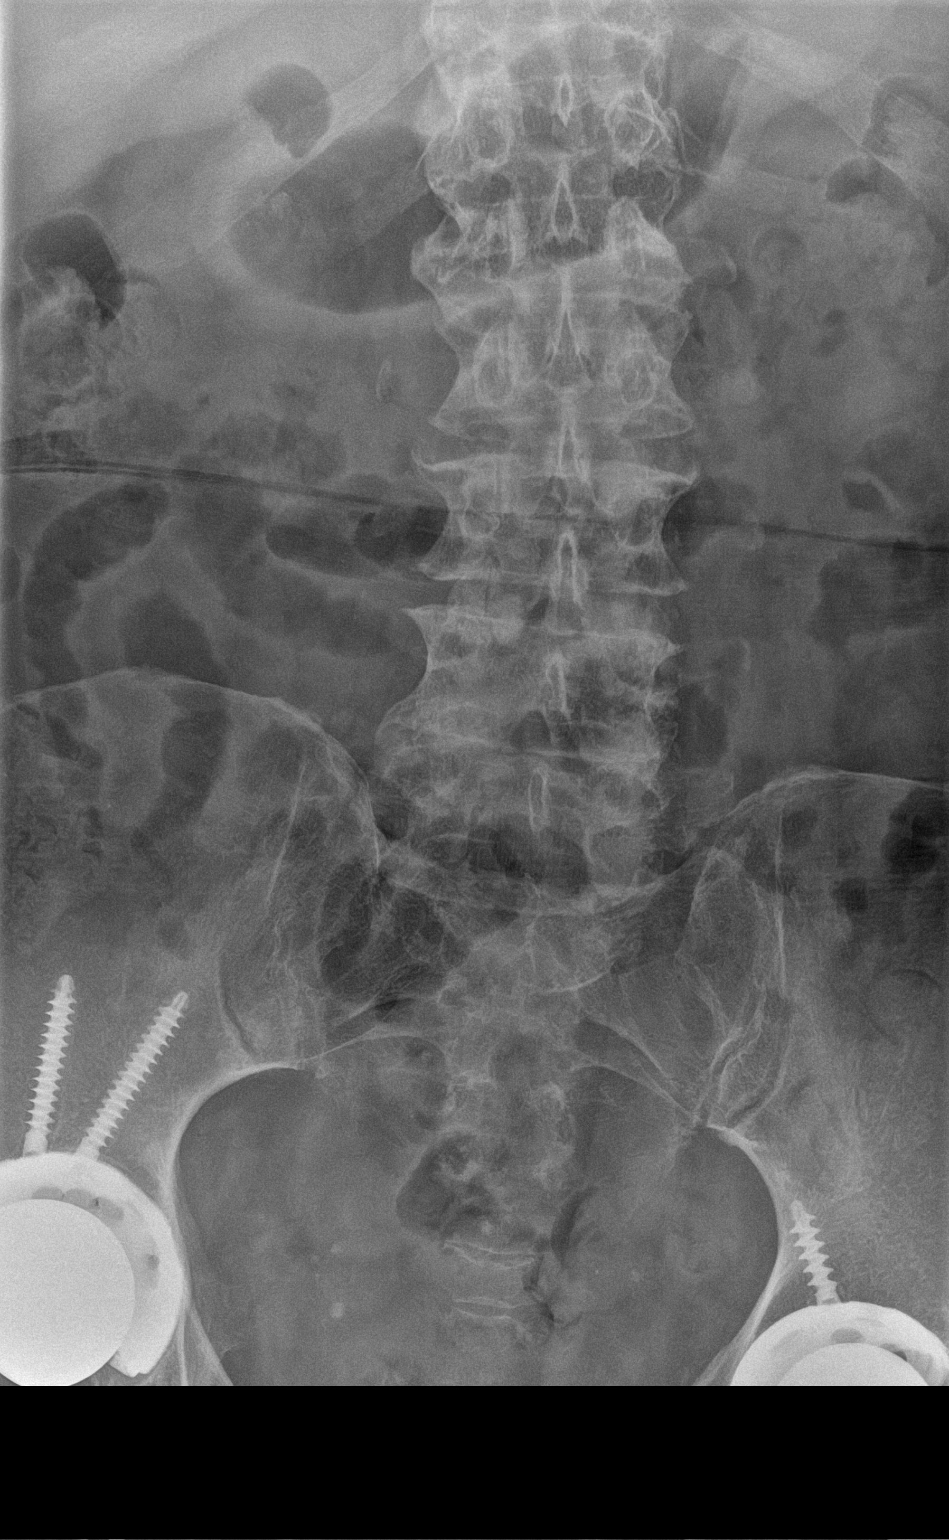

[l-spine lateral (1 of 2)]
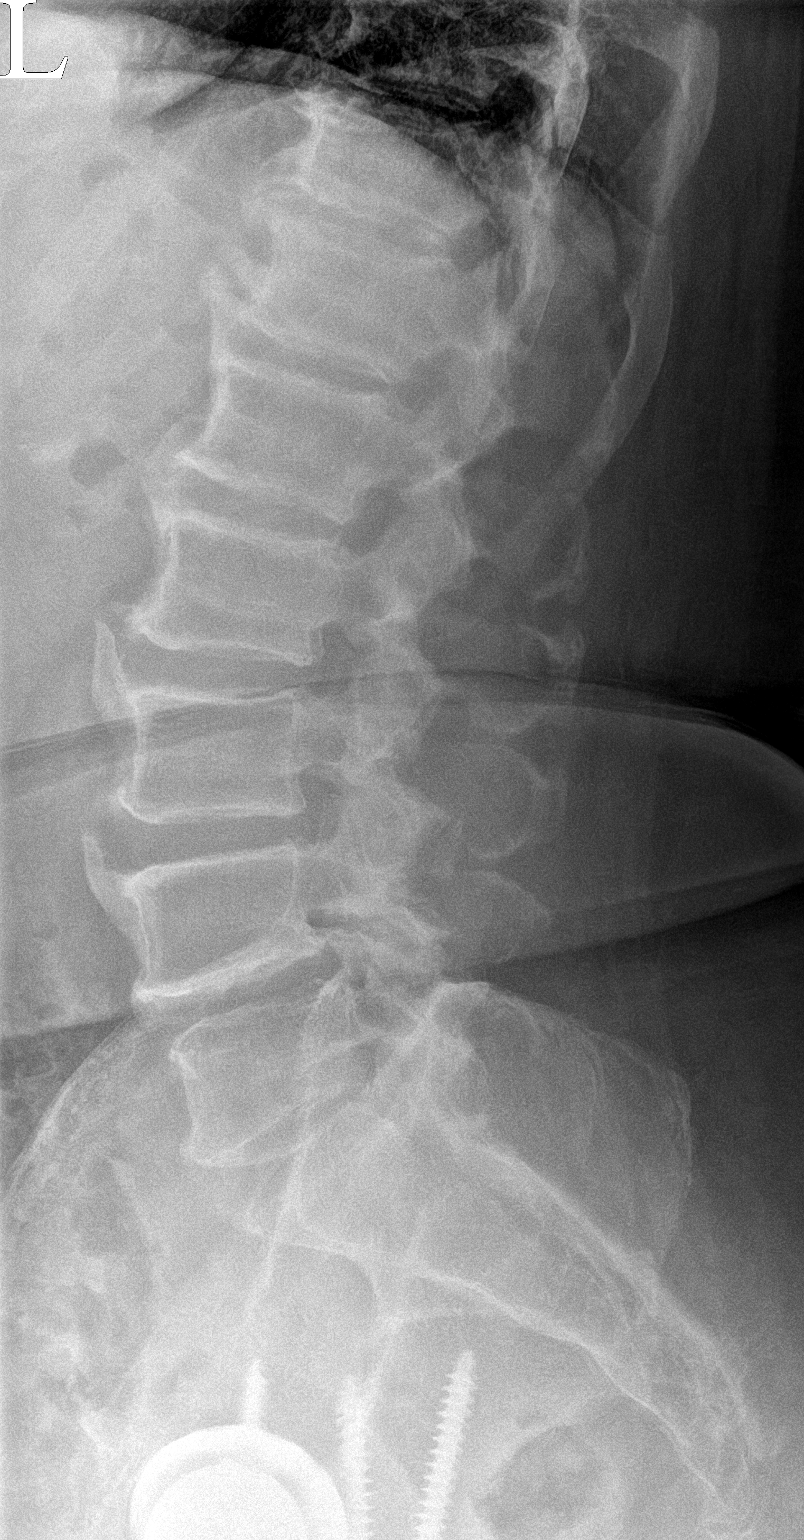

[l-spine lateral (2 of 2)]
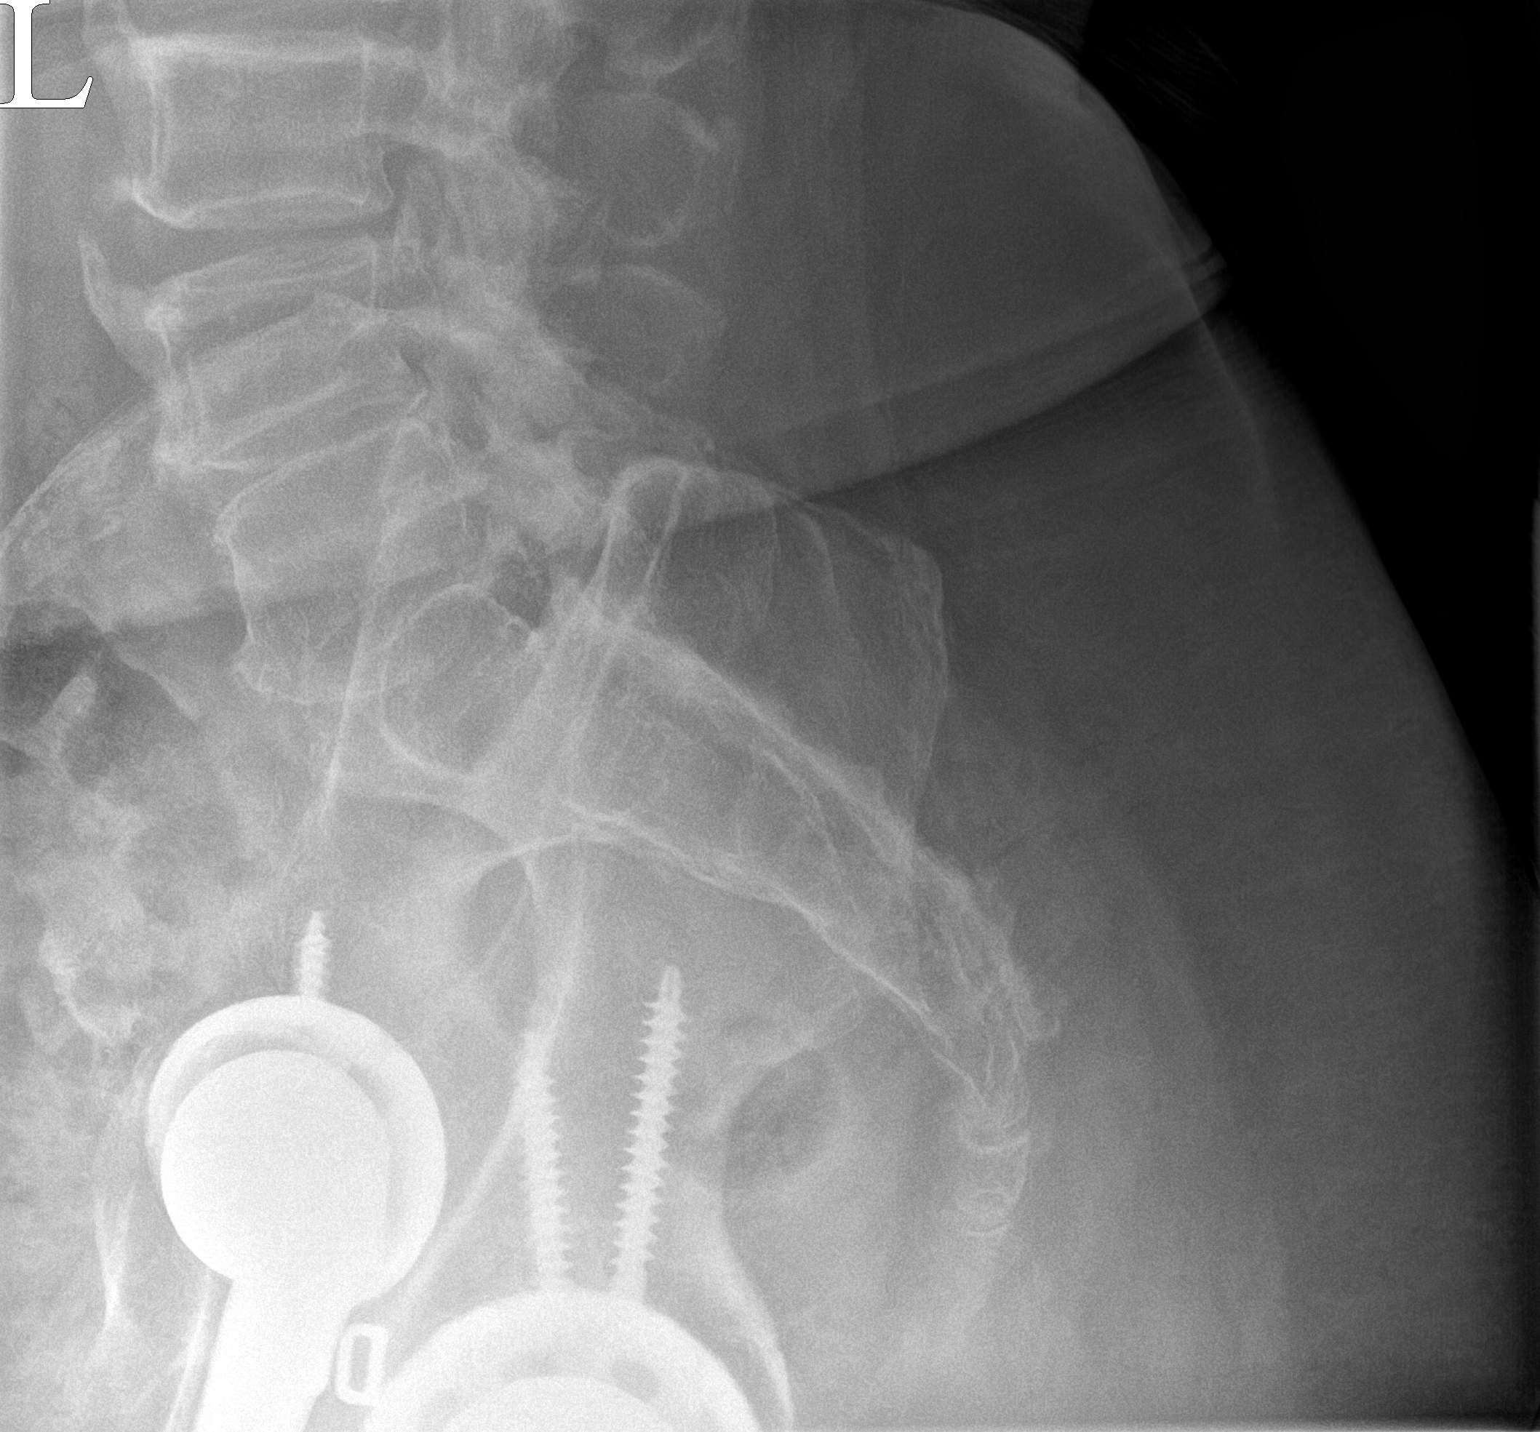

[3 of 3 positions shown; findings below may reference images not displayed]

FINDINGS: Grade 1 anterolisthesis L4 versus L5. No other malalignment. No
fractures. Multilevel degenerative disc disease with moderate
anterior osteophytes. Lower lumbar facet degenerative changes. No
other abnormalities.
IMPRESSION: Grade 1 anterolisthesis of L4 versus L5. Degenerative disc disease
and lower lumbar facet degenerative changes.

## 2020-12-27 NOTE — Patient Instructions (Addendum)
Back xray today Injected shoulder 2x and hip See me again in 6 weeks Ice is your friend IF you notice eye changes see eye doctor to check pressures

## 2020-12-27 NOTE — Assessment & Plan Note (Signed)
Patient does have the hip replacements.  Patient on x-ray previously did not show any type of loosening.  Given injection today with some good relief resolution of pain.  We will get lumbar x-rays to further evaluate in case we need MRI of the lumbar spine to further evaluate if any type of nerve impingement could be contributing as well.  Follow-up with me again 6 to 8 weeks

## 2020-12-27 NOTE — Assessment & Plan Note (Signed)
Repeat injection given today.  Also has some acromioclavicular arthritis.  Discussed with patient icing regimen and home exercises.  Patient has responded well to this injection previously and hopefully well again.  Discussed the posture and ergonomics and proper lifting mechanics.  Follow-up with me again 6 weeks

## 2020-12-27 NOTE — Assessment & Plan Note (Signed)
Injection given today.  Tolerated the procedure well.  We discussed which activities to do which wants to avoid.  Increase activity slowly.  Discussed icing regimen and home exercises.  Believe that this could be more of a flare to the underlying autoimmune disease.  Follow-up with me again in 6-8

## 2020-12-28 NOTE — Progress Notes (Signed)
Office Visit Note  Patient: Olivia Barnes             Date of Birth: [birth year]           MRN: [id number]             PCP: Charleston Poot, MD Referring: Charleston Poot, MD Visit Date: 01/10/2021 Occupation: @GUAROCC @  Subjective:  Pain in both groins   History of Present Illness: Olivia Barnes is a 65 y.o. female with a history of bilateral panuveitis and arthritis.  She states her eye symptoms are quite well controlled on the combination of CellCept and Humira.  She has been followed by Dr. Manuella Ghazi closely.  She has been having pain and discomfort in her bilateral groin region.  She states the pain from her groin radiates to her lower back.  She continues to have some pain and swelling in her left knee joint which is replaced.  She is still limping because of her left knee joint.  None of the  other joints are swollen.  She denies any history of oral ulcers, nasal ulcers, malar rash, photosensitivity, sicca symptoms or Raynaud's phenomenon.  Activities of Daily Living:  Patient reports morning stiffness for 10-15 minutes.   Patient Reports nocturnal pain.  Difficulty dressing/grooming: Denies Difficulty climbing stairs: Denies Difficulty getting out of chair: Denies Difficulty using hands for taps, buttons, cutlery, and/or writing: Denies  Review of Systems  Constitutional: Negative for fatigue.  HENT: Negative for mouth sores, mouth dryness and nose dryness.   Eyes: Negative for pain, itching and dryness.  Respiratory: Negative for shortness of breath and difficulty breathing.   Cardiovascular: Negative for chest pain and palpitations.  Gastrointestinal: Negative for blood in stool, constipation and diarrhea.  Endocrine: Positive for cold intolerance. Negative for increased urination.  Genitourinary: Negative for difficulty urinating.  Musculoskeletal: Positive for arthralgias, joint pain, joint swelling and morning stiffness. Negative for myalgias, muscle tenderness and  myalgias.  Skin: Negative for color change, rash and redness.  Allergic/Immunologic: Negative for susceptible to infections.  Neurological: Positive for parasthesias. Negative for dizziness, headaches and memory loss.  Hematological: Negative for bruising/bleeding tendency.  Psychiatric/Behavioral: Negative for confusion.    PMFS History:  Patient Active Problem List   Diagnosis Date Noted  . AC (acromioclavicular) arthritis 12/27/2020  . Greater trochanteric bursitis of right hip 12/27/2020  . Status post total left knee replacement 07/27/2020  . Degenerative arthritis of right knee 07/04/2020  . Neck muscle spasm 06/05/2020  . Unilateral primary osteoarthritis, left knee 05/22/2020  . Uveitis 01/11/2020  . Acute bursitis of right shoulder 12/06/2019  . Baker's cyst of knee, left 09/30/2019  . Ganglion of left knee 03/11/2019  . Tear of left hamstring 03/01/2019  . 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 Lumbar radiculopathy 03/20/2016  . Arthritis of right hip 10/20/2014  . Septic arthritis ([place]) 07/13/2014  . Transient synovitis of wrist 07/13/2014  . CN (constipation) 07/13/2014  . Essential hypertension 07/13/2014  . Primary osteoarthritis of right hip 07/13/2014    Past Medical History:  Diagnosis Date  . Arthritis   . Hyperlipemia   . Hypertension     Family History  Problem Relation Age of Onset  . Heart disease Mother   . Healthy Brother    Past Surgical History:  Procedure Laterality Date  . arm surgery Left    plate in left arm  . CYST EXCISION Left    knee   .  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 TOENAIL EXCISION Left 12/2020   2nd digit   . TOTAL KNEE ARTHROPLASTY Left 07/27/2020   Procedure: LEFT TOTAL KNEE ARTHROPLASTY;  Surgeon: Mcarthur Rossetti, MD;  Location: [facility];  Service: Orthopedics;  Laterality: Left;   Social History   Social History Narrative  . Not on file    Immunization History  Administered Date(s) Administered  . PFIZER(Purple Top)SARS-COV-2 Vaccination 09/15/2019, 10/04/2019, 06/08/2020     Objective: Vital Signs: BP (!) 146/84 (BP Location: Left Arm, Patient Position: Sitting, Cuff Size: Normal)   Pulse (!) 50   Resp 16   Ht 5' 3"  (1.6 m)   Wt 216 lb 9.6 oz (98.2 kg)   BMI 38.37 kg/m    Physical Exam Vitals and nursing note reviewed.  Constitutional:      Appearance: She is well-developed.  HENT:     Head: Normocephalic and atraumatic.  Eyes:     Conjunctiva/sclera: Conjunctivae normal.  Cardiovascular:     Rate and Rhythm: Normal rate and regular rhythm.     Heart sounds: Normal heart sounds.  Pulmonary:     Effort: Pulmonary effort is normal.     Breath sounds: Normal breath sounds.  Abdominal:     General: Bowel sounds are normal.     Palpations: Abdomen is soft.  Musculoskeletal:     Cervical back: Normal range of motion.  Lymphadenopathy:     Cervical: No cervical adenopathy.  Skin:    General: Skin is warm and dry.     Capillary Refill: Capillary refill takes less than 2 seconds.  Neurological:     Mental Status: She is alert and oriented to person, place, and time.  Psychiatric:        Behavior: Behavior normal.      Musculoskeletal Exam: C-spine was in good range of motion.  She had good range of motion of her shoulders, elbows, wrist joints, MCPs, PIPs and DIPs.  No synovitis was noted.  She had some discomfort range of motion of bilateral hip joints which are replaced.  Her left knee joint is replaced which was warm to touch.  Right knee joint was in good range of motion.  There was no tenderness over ankles or MTPs.  CDAI Exam: CDAI Score: -- Patient Global: --; Provider Global: -- Swollen: --; Tender: -- Joint Exam 01/10/2021   No joint exam has been documented for this visit   There is currently no information documented on the homunculus. Go to the Rheumatology activity and complete the  homunculus joint exam.  Investigation: No additional findings.  Imaging: DG Lumbar Spine 2-3 Views  Result Date: 12/29/2020 CLINICAL DATA:  Chronic pain. EXAM: LUMBAR SPINE - 2-3 VIEW COMPARISON:  None. FINDINGS: Grade 1 anterolisthesis L4 versus L5. No other malalignment. No fractures. Multilevel degenerative disc disease with moderate anterior osteophytes. Lower lumbar facet degenerative changes. No other abnormalities. IMPRESSION: Grade 1 anterolisthesis of L4 versus L5. Degenerative disc disease and lower lumbar facet degenerative changes. Electronically Signed   By: Dorise Bullion III M.D   On: 12/29/2020 22:09   [place] LIMITED JOINT SPACE STRUCTURES LOW BILAT(NO LINKED CHARGES)  Result Date: 12/31/2020 Procedure: Real-time Ultrasound Guided Injection of right glenohumeral joint Device: GE Logiq Q7 Ultrasound guided injection is preferred based studies that show increased duration, increased effect, greater accuracy, decreased procedural pain, increased response rate with ultrasound guided versus blind injection. Verbal informed consent obtained. Time-out conducted. Noted no overlying erythema, induration,  or other signs of local infection. Skin prepped in a sterile fashion. Local anesthesia: Topical Ethyl chloride. With sterile technique and under real time ultrasound guidance:  Joint visualized.  23g 1  inch needle inserted posterior approach. Pictures taken for needle placement. Patient did have injection of 2 cc of 1% lidocaine, 2 cc of 0.5% Marcaine, and 1.0 cc of Kenalog 40 mg/dL. Completed without difficulty Pain immediately resolved suggesting accurate placement of the medication. Advised to call if fevers/chills, erythema, induration, drainage, or persistent bleeding. Impression: Technically successful ultrasound guided injection.  Procedure: Real-time Ultrasound Guided Injection of right acromioclavicular joint Device: GE Logiq Q7 Ultrasound guided injection is preferred based studies that  show increased duration, increased effect, greater accuracy, decreased procedural pain, increased response rate, and decreased cost with ultrasound guided versus blind injection. Verbal informed consent obtained. Time-out conducted. Noted no overlying erythema, induration, or other signs of local infection. Skin prepped in a sterile fashion. Local anesthesia: Topical Ethyl chloride. With sterile technique and under real time ultrasound guidance: With a 25-gauge half inch needle injected with 0.5 cc of 0.5% Marcaine and 0.5 cc of Kenalog 40 mg/mL Completed without difficulty Pain immediately resolved suggesting accurate placement of the medication. Advised to call if fevers/chills, erythema, induration, drainage, or persistent bleeding. Impression: Technically successful ultrasound guided injection.   Procedure: Real-time Ultrasound Guided Injection of right greater trochanteric bursitis secondary to patient's body habitus Device: GE Logiq Q7 Ultrasound guided injection is preferred based studies that show increased duration, increased effect, greater accuracy, decreased procedural pain, increased response rate, and decreased cost with ultrasound guided versus blind injection. Verbal informed consent obtained. Time-out conducted. Noted no overlying erythema, induration, or other signs of local infection. Skin prepped in a sterile fashion. Local anesthesia: Topical Ethyl chloride. With sterile technique and under real time ultrasound guidance:  Greater trochanteric area was visualized and patient's bursa was noted. A 22-gauge 3 inch needle was inserted and 4 cc of 0.5% Marcaine and 1 cc of Kenalog 40 mg/dL was injected. Pictures taken standing away from replacement more superficial. Completed without difficulty Pain immediately improved suggesting accurate placement of the medication. Advised to call if fevers/chills, erythema, induration, drainage, or persistent bleeding. Impression: Technically successful  ultrasound guided injection.  XR Knee 1-2 Views Left  Result Date: 01/01/2021 Left knee AP lateral views: Status post left total knee arthroplasty with well-seated components.  No acute fractures.  Knee is well located.  No bony abnormalities.   Recent Labs: Lab Results  Component Value Date   WBC 8.3 07/28/2020   HGB 10.9 (L) 07/28/2020   PLT 140 (L) 07/28/2020   NA 135 07/28/2020   K 4.3 07/28/2020   CL 103 07/28/2020   CO2 23 07/28/2020   GLUCOSE 145 (H) 07/28/2020   BUN 18 07/28/2020   CREATININE 0.63 07/28/2020   CALCIUM 8.5 (L) 07/28/2020   GFRAA  07/06/2010    >60        The eGFR has been calculated using the MDRD equation. This calculation has not been validated in all clinical situations. eGFR's persistently <60 mL/min signify possible Chronic Kidney Disease.    Speciality Comments: No specialty comments available.  Procedures:  No procedures performed Allergies: Gabapentin, Meloxicam, and Penicillins   Assessment / Plan:     Visit Diagnoses: Panuveitis of both eyes - Followed by Dr. Manuella Ghazi at [facility].  She states she has not had any recent flares of uveitis.  Inflammatory arthritis - History of left knee joint and  left wrist joint swelling.  All autoimmune work-up was negative.  Symptoms responded to immunosuppressive therapy.  High risk medication use - CellCept 1500 mg p.o. twice daily, Humira 40 mg subcu every other week started July 2021.  Both medications are monitored by Dr. Manuella Ghazi.   Chronic right shoulder pain-improved after the cortisone injection done by Dr. Tamala Julian in February 2022.  Pain in both hands - History of left wrist joint swelling.  X-rays were consistent with osteoarthritis.  I do not see any synovitis on examination today.  History of total hip replacement, bilateral - Chronic pain despite bilateral total hip replacement.  History of septic arthritis after hip replacement treated with antibiotics in the past.  She continues  to have pain and discomfort in the bilateral inguinal region.  I have advised her to schedule an appointment with the orthopedic surgeon for evaluation.  Status post total knee replacement, left - August 16, 2020 by Dr. Ninfa Linden.  She continues to have pain and swelling in her left knee joint.  Lumbar radiculopathy-she continues to have some lower back pain.  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  Hypertensive retinopathy of both eyes  Essential hypertension-blood pressures are still elevated.  Have advised her to monitor blood pressure closely and follow-up with the PCP.  Dyslipidemia  Orders: No orders of the defined types were placed in this encounter.  No orders of the defined types were placed in this encounter.    Follow-Up Instructions: Return in about 6 months (around 07/13/2021) for Panuveitis, Inflammatory arthritis.   Bo Merino, MD  Note - This record has been created using Editor, commissioning.  Chart creation errors have been sought, but may not always  have been located. Such creation errors do not reflect on  the standard of medical care.

## 2021-01-01 ENCOUNTER — Ambulatory Visit: Payer: 59 | Admitting: Orthopaedic Surgery

## 2021-01-01 ENCOUNTER — Encounter: Payer: Self-pay | Admitting: Orthopaedic Surgery

## 2021-01-01 ENCOUNTER — Ambulatory Visit (INDEPENDENT_AMBULATORY_CARE_PROVIDER_SITE_OTHER): Payer: 59

## 2021-01-01 DIAGNOSIS — Z96652 Presence of left artificial knee joint: Secondary | ICD-10-CM

## 2021-01-01 NOTE — Progress Notes (Signed)
Office Visit Note   Patient: Olivia Barnes           Date of Birth: May 31, 1956           MRN: [id number] Visit Date: 01/01/2021              Requested by: Olivia Moron, MD [address] [place],  [place] [address] PCP: Olivia Moron, MD   Assessment & Plan: Visit Diagnoses:  1. History of left knee replacement     Plan: She will continue to work on range of motion strengthening the knee.  Discussed with her that she may have some ankle swelling even up to a year postop.  Continue to work on scar tissue mobilization.  Questions encouraged and answered.  We will see her back in 6 months and at that time we will obtain an AP and lateral view of her left knee.  Questions were encouraged and answered by Olivia Barnes and myself.  Follow-Up Instructions: Return in about 6 months (around 07/04/2021) for Radiographs.   Orders:  Orders Placed This Encounter  Procedures  . XR Knee 1-2 Views Left   No orders of the defined types were placed in this encounter.     Procedures: No procedures performed   Clinical Data: No additional findings.   Subjective: Chief Complaint  Patient presents with  . Left Knee - Follow-up    HPI Olivia Barnes returns today status post left total knee arthroplasty 07/27/2020.  She is overall doing well still having some swelling some discomfort in the knee but states it is much better than when she had preop.  She continues to work on range of motion and strengthening.  Review of Systems See HPI otherwise negative  Objective: Vital Signs: There were no vitals taken for this visit.  Physical Exam General: Well-developed well-nourished female no acute distress.  Ambulates without any assistive device.  Ortho Exam Left knee surgical incisions well-healed.  Slight opening valgus varus stressing.  Anterior drawer is negative.  Full extension and flexion to approximately 115 degrees.  Calf supple nontender.  Specialty Comments:  No  specialty comments available.  Imaging: XR Knee 1-2 Views Left  Result Date: 01/01/2021 Left knee AP lateral views: Status post left total knee arthroplasty with well-seated components.  No acute fractures.  Knee is well located.  No bony abnormalities.    PMFS History: Patient Active Problem List   Diagnosis Date Noted  . AC (acromioclavicular) arthritis 12/27/2020  . Greater trochanteric bursitis of right hip 12/27/2020  . Status post total left knee replacement 07/27/2020  . Degenerative arthritis of right knee 07/04/2020  . Neck muscle spasm 06/05/2020  . Unilateral primary osteoarthritis, left knee 05/22/2020  . Uveitis 01/11/2020  . Acute bursitis of right shoulder 12/06/2019  . Baker's cyst of knee, left 09/30/2019  . Ganglion of left knee 03/11/2019  . Tear of left hamstring 03/01/2019  . 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 Lumbar radiculopathy 03/20/2016  . Arthritis of right hip 10/20/2014  . Septic arthritis ([facility]) 07/13/2014  . Transient synovitis of wrist 07/13/2014  . CN (constipation) 07/13/2014  . Essential hypertension 07/13/2014  . Primary osteoarthritis of right hip 07/13/2014   Past Medical History:  Diagnosis Date  . Arthritis   . Hyperlipemia   . Hypertension     Family History  Problem Relation Age of Onset  . Heart disease Mother   . Healthy Brother  Past Surgical History:  Procedure Laterality Date  . arm surgery Left    plate in left arm  . CYST EXCISION Left    knee   . 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 TOTAL KNEE ARTHROPLASTY Left 07/27/2020   Procedure: LEFT TOTAL KNEE ARTHROPLASTY;  Surgeon: Olivia Hitch, MD;  Location: [facility];  Service: Orthopedics;  Laterality: Left;   Social History   Occupational History  . Not on file  Tobacco Use  . Smoking status: Never Smoker  . Smokeless tobacco: Never Used  Vaping Use  . Vaping Use: Never used   Substance and Sexual Activity  . Alcohol use: Never    Alcohol/week: 0.0 standard drinks  . Drug use: Never  . Sexual activity: Not on file

## 2021-01-10 ENCOUNTER — Ambulatory Visit: Payer: 59 | Admitting: Rheumatology

## 2021-01-10 ENCOUNTER — Other Ambulatory Visit: Payer: Self-pay

## 2021-01-10 ENCOUNTER — Encounter: Payer: Self-pay | Admitting: Rheumatology

## 2021-01-10 VITALS — BP 146/84 | HR 50 | Resp 16 | Ht 63.0 in | Wt 216.6 lb

## 2021-01-10 DIAGNOSIS — M79641 Pain in right hand: Secondary | ICD-10-CM

## 2021-01-10 DIAGNOSIS — M79642 Pain in left hand: Secondary | ICD-10-CM

## 2021-01-10 DIAGNOSIS — H44113 Panuveitis, bilateral: Secondary | ICD-10-CM

## 2021-01-10 DIAGNOSIS — M1990 Unspecified osteoarthritis, unspecified site: Secondary | ICD-10-CM | POA: Diagnosis not present

## 2021-01-10 DIAGNOSIS — Z96652 Presence of left artificial knee joint: Secondary | ICD-10-CM

## 2021-01-10 DIAGNOSIS — M25511 Pain in right shoulder: Secondary | ICD-10-CM

## 2021-01-10 DIAGNOSIS — G8929 Other chronic pain: Secondary | ICD-10-CM

## 2021-01-10 DIAGNOSIS — H35033 Hypertensive retinopathy, bilateral: Secondary | ICD-10-CM

## 2021-01-10 DIAGNOSIS — Z79899 Other long term (current) drug therapy: Secondary | ICD-10-CM | POA: Diagnosis not present

## 2021-01-10 DIAGNOSIS — M5416 Radiculopathy, lumbar region: Secondary | ICD-10-CM

## 2021-01-10 DIAGNOSIS — Z96643 Presence of artificial hip joint, bilateral: Secondary | ICD-10-CM

## 2021-01-10 DIAGNOSIS — Z8669 Personal history of other diseases of the nervous system and sense organs: Secondary | ICD-10-CM

## 2021-01-10 DIAGNOSIS — I10 Essential (primary) hypertension: Secondary | ICD-10-CM

## 2021-01-10 DIAGNOSIS — E785 Hyperlipidemia, unspecified: Secondary | ICD-10-CM

## 2021-02-06 NOTE — Progress Notes (Signed)
[facility] [address] [place] [address] Phone: [phone] Subjective:   Olivia Barnes, am serving as a scribe for Dr. Antoine Primas.  This visit occurred during the SARS-CoV-2 public health emergency.  Safety protocols were in place, including screening questions prior to the visit, additional usage of staff PPE, and extensive cleaning of exam room while observing appropriate contact time as indicated for disinfecting solutions.    I'm seeing this patient by the request  of:  [name], MD  CC: Shoulder pain, bilateral hip pain follow-up  ION:[id number]  12/27/2020 Patient does have the hip replacements.  Patient on x-ray previously did not show any type of loosening.  Given injection today with some good relief resolution of pain.  We will get lumbar x-rays to further evaluate in case we need MRI of the lumbar spine to further evaluate if any type of nerve impingement could be contributing as well.  Follow-up with me again 6 to 8 weeks  Injection given today.  Tolerated the procedure well.  We discussed which activities to do which wants to avoid.  Increase activity slowly.  Discussed icing regimen and home exercises.  Believe that this could be more of a flare to the underlying autoimmune disease.  Follow-up with me again in 6-8  Repeat injection given today.  Also has some acromioclavicular arthritis.  Discussed with patient icing regimen and home exercises.  Patient has responded well to this injection previously and hopefully well again.  Discussed the posture and ergonomics and proper lifting mechanics.  Follow-up with me again 6 weeks  Update 02/07/2021 Olivia Barnes is a 65 y.o. female coming in with complaint of R shoulder and R hip pain. Patient states that her shoulder is better. Pain in hips have not improved and is radiate superiorly with flexion. Using Aleve for pain.  Patient does state that the back does seem to be tighter.   Patient has noticed that even some discomfort at night.  Patient states that it is affecting daily activities as well as sometimes her job performance.  Patient does give history of some mild neuropathy she states.  Having more in her hands.  States that sometimes they become red and cold.      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    TOENAIL EXCISION Left 12/2020   2nd digit    TOTAL KNEE ARTHROPLASTY Left 07/27/2020   Procedure: LEFT TOTAL KNEE ARTHROPLASTY;  Surgeon: Kathryne Hitch, MD;  Location: [facility];  Service: Orthopedics;  Laterality: Left;   Social History   Socioeconomic History   Marital status: Married    Spouse name: Not on file   Number of children: Not on file   Years of education: Not on file   Highest education level: Not on file  Occupational History   Not on file  Tobacco Use   Smoking status: Never   Smokeless tobacco: Never  Vaping Use   Vaping Use: Never used  Substance and Sexual Activity   Alcohol use: Never    Alcohol/week: 0.0 standard drinks   Drug use: Never   Sexual activity: Not on file  Other Topics Concern  Not on file  Social History Narrative   Not on file   Social Determinants of Health   Financial Resource Strain: Not on file  Food Insecurity: Not on file  Transportation Needs: Not on file  Physical Activity: Not on file  Stress: Not on file  Social Connections: Not on file   Allergies  Allergen Reactions   Gabapentin Nausea And Vomiting   Meloxicam    Penicillins Rash   Family History  Problem Relation Age of Onset   Heart disease Mother    Healthy Brother      Current Outpatient Medications (Cardiovascular):    losartan-hydrochlorothiazide (HYZAAR) 100-12.5 MG tablet, Take 1  tablet by mouth daily.   rosuvastatin (CRESTOR) 20 MG tablet, Take 20 mg by mouth daily.   Current Outpatient Medications (Analgesics):    Adalimumab (HUMIRA) 40 MG/0.4ML PSKT, Inject 40 mg into the skin every 14 (fourteen) days.    Current Outpatient Medications (Other):    bimatoprost (LUMIGAN) 0.03 % ophthalmic solution, Place 1 drop into both eyes at bedtime.    COMBIGAN 0.2-0.5 % ophthalmic solution, Place 1 drop into both eyes 2 (two) times daily.    Multiple Vitamin (MULTIVITAMIN PO), Take 1 tablet by mouth daily.    mycophenolate (CELLCEPT) 500 MG tablet, Take 1,500 mg by mouth 2 (two) times daily.    polyethylene glycol powder (GLYCOLAX/MIRALAX) 17 GM/SCOOP powder, Take 1 Container by mouth daily.    tacrolimus (PROGRAF) 1 MG capsule, Take 2 mg by mouth 2 (two) times daily.    terbinafine (LAMISIL) 250 MG tablet, Take 250 mg by mouth daily.   Reviewed prior external information including notes and imaging from  primary care provider As well as notes that were available from care everywhere and other healthcare systems.  Past medical history, social, surgical and family history all reviewed in electronic medical record.  No pertanent information unless stated regarding to the chief complaint.   Review of Systems:  No headache, visual changes, nausea, vomiting, diarrhea, constipation, dizziness, abdominal pain, skin rash, fevers, chills, night sweats, weight loss, swollen lymph nodes, oint swelling  Objective  Blood pressure 128/76, pulse 70, height 5\' 3"  (1.6 m), weight 216 lb (98 kg), SpO2 99 %.   General: No apparent distress alert and oriented x3 mood and affect normal, dressed appropriately.  HEENT: Pupils equal, extraocular movements intact  Respiratory: Patient's speak in full sentences and does not appear short of breath  Cardiovascular: No lower extremity edema, non tender, no erythema  Gait antalgic Back exam shows the patient is tender to palpation in the L4-L5  and L5-S1 paraspinal musculature.  Significant tightness noted in the paraspinal musculature.  Tightness with straight leg test with mild radicular symptoms to the lateral hip.  Patient still has significant tightness with bilaterally.  Deep tendon reflexes distally though do appear to be intact.  Pain to palpation over the lateral hips does seem to be out of proportion   Impression and Recommendations:     The above documentation has been reviewed and is accurate and complete Olivia Brownie, DO

## 2021-02-07 ENCOUNTER — Ambulatory Visit: Payer: Self-pay

## 2021-02-07 ENCOUNTER — Encounter: Payer: Self-pay | Admitting: Family Medicine

## 2021-02-07 ENCOUNTER — Other Ambulatory Visit: Payer: Self-pay

## 2021-02-07 ENCOUNTER — Encounter: Payer: Self-pay | Admitting: Neurology

## 2021-02-07 ENCOUNTER — Ambulatory Visit: Payer: 59 | Admitting: Family Medicine

## 2021-02-07 VITALS — BP 128/76 | HR 70 | Ht 63.0 in | Wt 216.0 lb

## 2021-02-07 DIAGNOSIS — M25551 Pain in right hip: Secondary | ICD-10-CM

## 2021-02-07 DIAGNOSIS — R200 Anesthesia of skin: Secondary | ICD-10-CM | POA: Diagnosis not present

## 2021-02-07 DIAGNOSIS — M25552 Pain in left hip: Secondary | ICD-10-CM

## 2021-02-07 DIAGNOSIS — M25559 Pain in unspecified hip: Secondary | ICD-10-CM

## 2021-02-07 DIAGNOSIS — M19011 Primary osteoarthritis, right shoulder: Secondary | ICD-10-CM

## 2021-02-07 DIAGNOSIS — G8928 Other chronic postprocedural pain: Secondary | ICD-10-CM

## 2021-02-07 DIAGNOSIS — R202 Paresthesia of skin: Secondary | ICD-10-CM

## 2021-02-07 DIAGNOSIS — G8929 Other chronic pain: Secondary | ICD-10-CM

## 2021-02-07 DIAGNOSIS — M25511 Pain in right shoulder: Secondary | ICD-10-CM

## 2021-02-07 DIAGNOSIS — M545 Low back pain, unspecified: Secondary | ICD-10-CM

## 2021-02-07 DIAGNOSIS — M67331 Transient synovitis, right wrist: Secondary | ICD-10-CM

## 2021-02-07 DIAGNOSIS — Z96643 Presence of artificial hip joint, bilateral: Secondary | ICD-10-CM

## 2021-02-07 NOTE — Assessment & Plan Note (Signed)
Patient did respond extremely well to the injection.  No significant big changes in treatment at this time.

## 2021-02-07 NOTE — Assessment & Plan Note (Signed)
Continued pain in the hips.  Patient has some pain that seems to be mostly posterior but then seems to radiate anteriorly sometimes.  He does have some signs and symptoms consistent with the possibility of lumbar radiculopathy with mild positive straight leg test as well.  This is affecting daily activities as well.  At this point I would like to get a bone scan to make sure that there is no significant loosening of the hips bilaterally.  We will also get a lumbar MRI to further evaluate for any type of nerve impingement.  Patient does have an L4-L5 spondylolisthesis that could be contributing.  Depending on findings we will discuss management thereafter.

## 2021-02-07 NOTE — Patient Instructions (Signed)
Bone scan  MRI lumbar spine [phone] Referral to Neurology Will contact you once we get results

## 2021-02-07 NOTE — Assessment & Plan Note (Signed)
Patient has had transient synovitis of the wrist previously.  Patient states that she has had this intermittently now for some time.  Patient describing though now having more of a erythema of the hands and and coldness that is associated with it.  Patient's grip strength though today seems to be doing relatively well.  At this point I would like to send patient to neurology.  We would normally do work-up ourselves but do feel that further evaluation with another physician would be helpful with patient's multiple different problems.  Patient is in agreement with the plan and will come back to [place] otherwise. Patient with leaving does state that she is having similar sensations in her feet.

## 2021-02-11 ENCOUNTER — Other Ambulatory Visit: Payer: Self-pay | Admitting: Family Medicine

## 2021-02-16 ENCOUNTER — Other Ambulatory Visit: Payer: Self-pay

## 2021-02-16 ENCOUNTER — Ambulatory Visit
Admission: RE | Admit: 2021-02-16 | Discharge: 2021-02-16 | Disposition: A | Discharge status: Home / Self Care | Payer: 59 | Source: Ambulatory Visit | Attending: Family Medicine | Admitting: Family Medicine

## 2021-02-16 DIAGNOSIS — M545 Low back pain, unspecified: Secondary | ICD-10-CM

## 2021-02-16 IMAGING — MR MR LUMBAR SPINE W/O CM
4 of 5 series · 25 of 48 positions shown · non-contrast
Comparison: Lumbar spine radiographs [DATE].

CLINICAL DATA: Lumbar spine pain. Low back pain, greater than 6
weeks; low back pain. Additional history provided by scanning
technologist: Patient reports low back pain radiating to bilateral
side/hips for several months, no known injury.

EXAM:
MRI LUMBAR SPINE WITHOUT CONTRAST
TECHNIQUE: Multiplanar, multisequence MR imaging of the lumbar spine was
performed. No intravenous contrast was administered.

[Series 3: T2 · sagittal · 4.0mm · 1.09mm/px · 6 of 17 slices shown (1 of 2)]
[im 1/17]
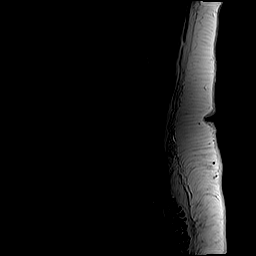
[im 4/17]
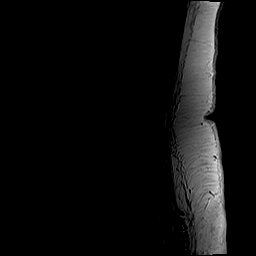
[im 7/17]
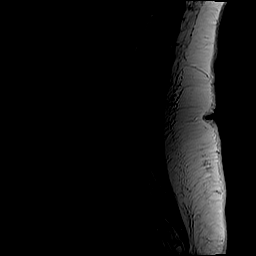
[im 10/17]
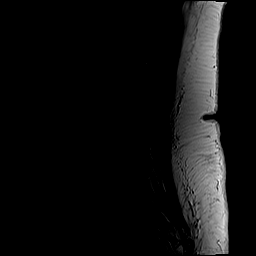
[im 13/17]
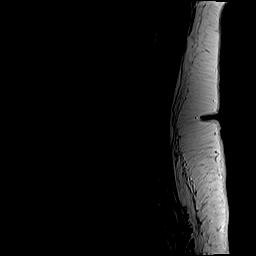
[im 17/17]
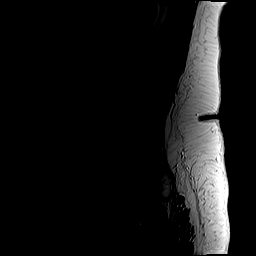

[Series 5: T1 · sagittal · 4.0mm · 1.09mm/px · 6 of 17 slices shown (1 of 2)]
[im 1/17]
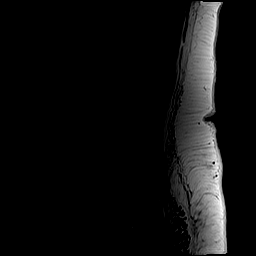
[im 4/17]
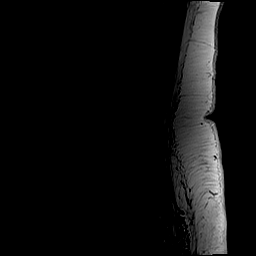
[im 7/17]
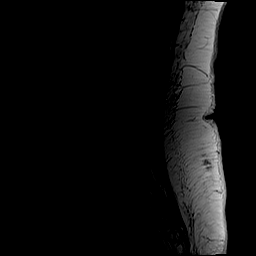
[im 10/17]
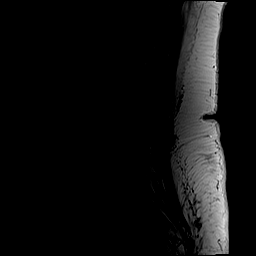
[im 13/17]
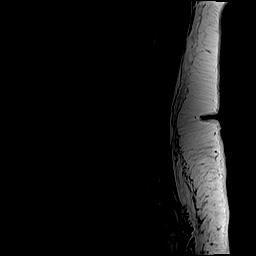
[im 17/17]
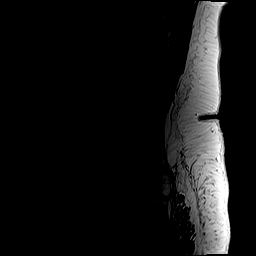

[Series 6: T2 · axial · 4.0mm · 0.39mm/px · z∈[+56,+243]mm · 9 of 40 slices shown (2 of 2)]
[im 1/40]
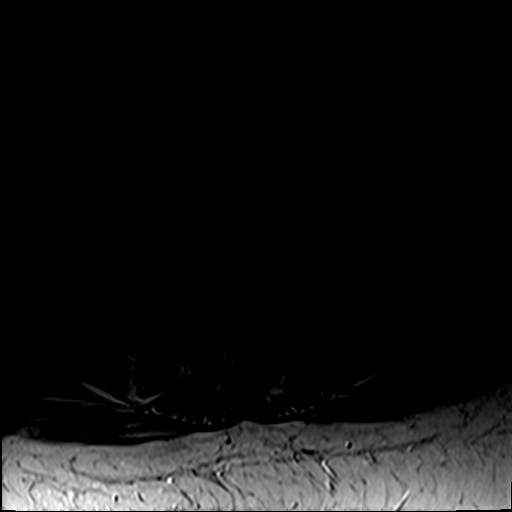
[im 6/40]
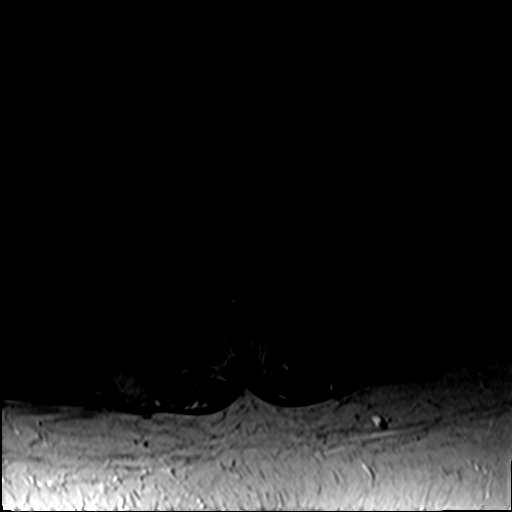
[im 12/40]
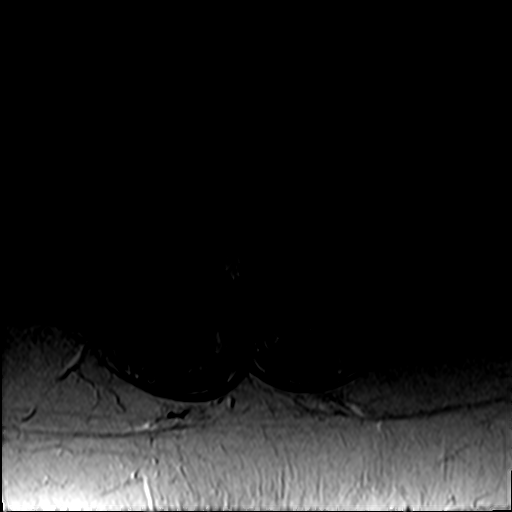
[im 17/40]
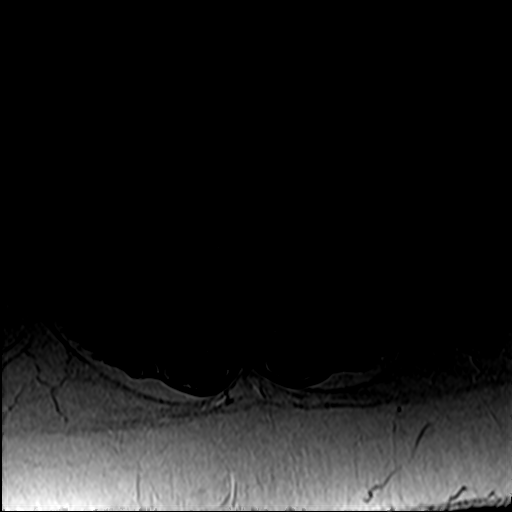
[im 20/40]
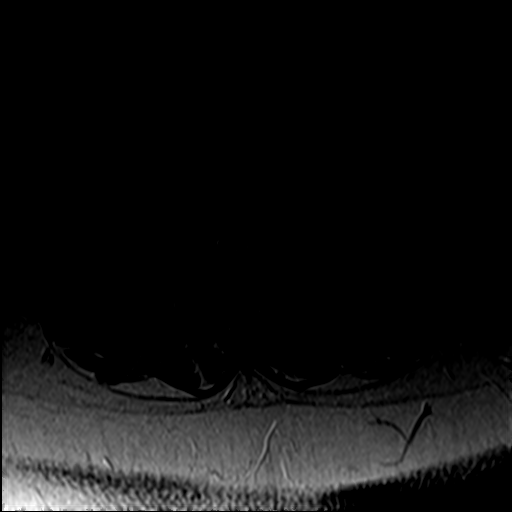
[im 23/40]
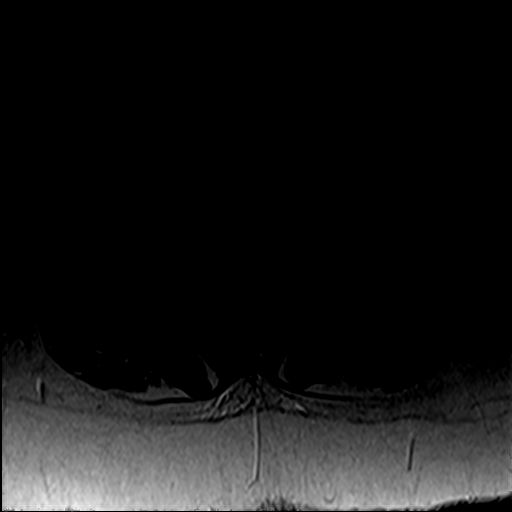
[im 28/40]
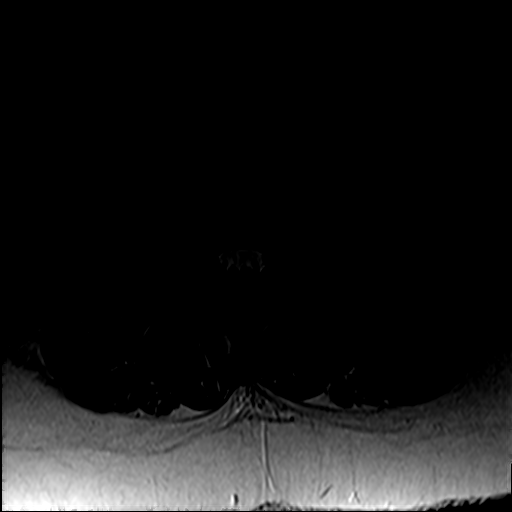
[im 34/40]
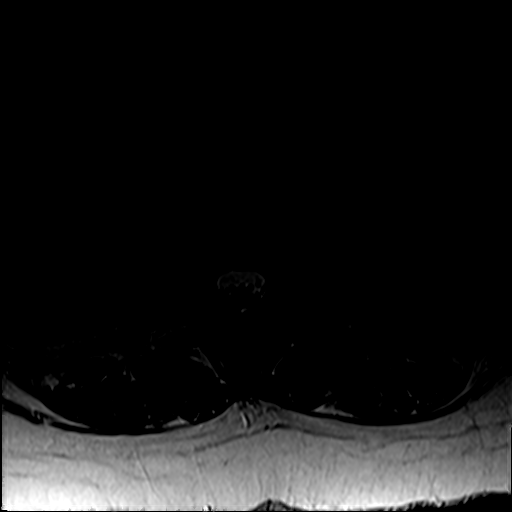
[im 40/40]
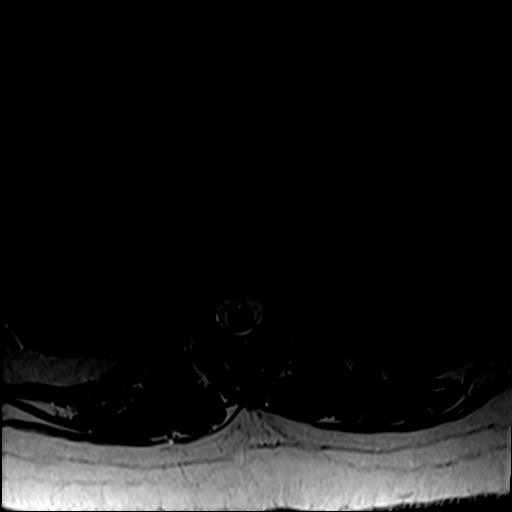

[Series 7: T1 · axial · 4.0mm · 0.39mm/px · z∈[+56,+214]mm · 4 of 40 slices shown (2 of 2)]
[im 1/40]
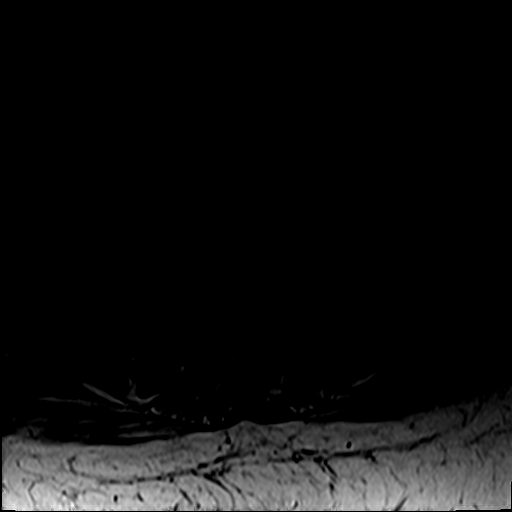
[im 6/40]
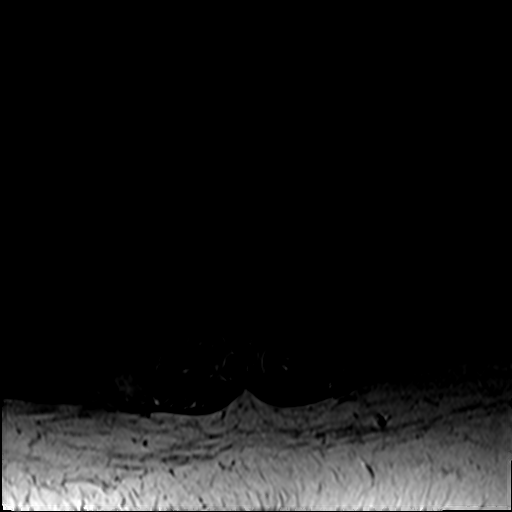
[im 20/40]
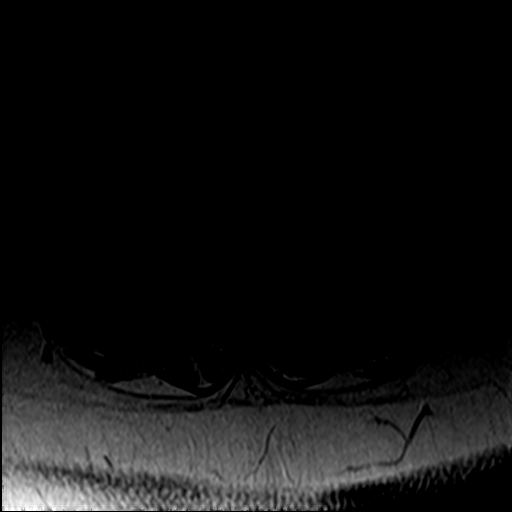
[im 34/40]
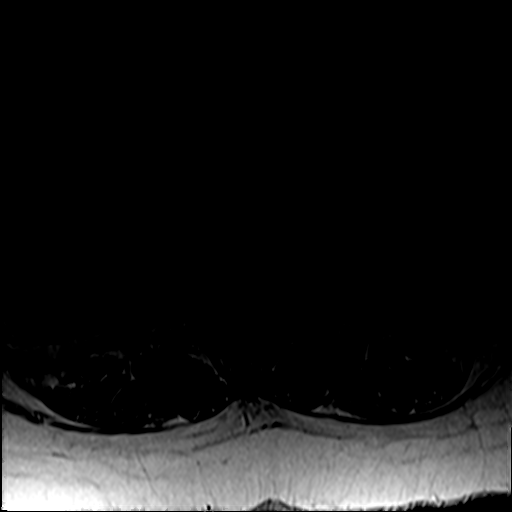

[25 of 48 positions shown; findings below may reference images not displayed]

FINDINGS: Segmentation: 5 lumbar vertebrae. The caudal most well-formed
intervertebral disc space is designated L5-S1.

Alignment: Lumbar levocurvature. Trace L1-L2 and L2-L3 grade 1
retrolisthesis. Trace L4-L5 grade 1 anterolisthesis.

Vertebrae: Vertebral body height is maintained. Trace degenerative
endplate edema anteriorly at L1-L2 and L3-L4. No significant marrow
edema or focal suspicious osseous lesion. Multilevel ventrolateral
osteophytes.

Conus medullaris and cauda equina: Conus extends to the L2 level. No
signal abnormality within the visualized distal spinal cord.

Paraspinal and other soft tissues: No abnormality identified within
included portions of the abdomen/retroperitoneum. Paraspinal soft
tissues within normal limits.

Disc levels:

Mild/moderate disc degeneration at L4-L5. No more than mild disc
degeneration at the remaining levels.

T11-T12: Imaged sagittally. No significant disc herniation or
stenosis.

T12-L1: No significant disc herniation or stenosis.

L1-L2: Trace retrolisthesis. Minimal disc bulge and facet arthrosis.
Minimal relative left subarticular narrowing without nerve root
impingement. Central canal patent. No significant foraminal
stenosis.

L2-L3: Trace retrolisthesis. Small disc bulge with minimal endplate
spurring. Mild facet arthrosis. Slight ligamentum flavum hypertrophy
on the left. Mild bilateral subarticular narrowing without nerve
root impingement. Central canal patent. Mild bilateral neural
foraminal narrowing.

L3-L4: Disc bulge with mild endplate spurring. Moderate facet
arthrosis with ligamentum flavum hypertrophy. Mild bilateral
subarticular narrowing with slight crowding of the descending L4
nerve roots. Mild central canal stenosis. Bilateral neural foraminal
narrowing (mild/moderate right, moderate left).

L4-L5: Trace grade 1 anterolisthesis. Disc uncovering with disc
bulge. Moderate/advanced facet arthrosis with ligamentum flavum
hypertrophy. Bilateral subarticular narrowing (mild right, moderate
left) with crowding of the left greater than right descending L5
nerve roots. Central canal patent. Moderate bilateral neural
foraminal narrowing (greater on the right).

L5-S1: Mild endplate spurring. Small right subarticular disc
protrusion (series 3, image 7). Mild facet arthrosis. The disc
protrusion contributes to moderate right subarticular stenosis,
encroaching upon the descending right S1 nerve root (series 6, image
34). Central canal patent. Mild relative right neural foraminal
narrowing.
IMPRESSION: Lumbar spondylosis, as outlined and with findings most notably as
follows.

At L4-L5, there is trace grade 1 anterolisthesis. Mild/moderate disc
degeneration. Multifactorial bilateral subarticular narrowing (mild
right, moderate left) with crowding of the left greater than right
descending L5 nerve roots. Moderate bilateral neural foraminal
narrowing.

At L3-L4, there is multifactorial mild bilateral subarticular
stenosis with slight crowding of the descending L4 nerve roots. Mild
relative central canal narrowing. Bilateral neural foraminal
narrowing (mild/moderate right, moderate left).

At L5-S1, a small right subarticular disc protrusion contributes to
moderate right subarticular stenosis, encroaching upon the
descending right S1 nerve root. Correlate for right S1
radiculopathy. Mild relative right neural foraminal narrowing.

No more than mild relative spinal canal or neural foraminal
narrowing at the remaining levels.

Lumbar levocurvature.

## 2021-02-20 ENCOUNTER — Encounter: Payer: Self-pay | Admitting: Family Medicine

## 2021-03-01 ENCOUNTER — Ambulatory Visit (HOSPITAL_COMMUNITY)
Admission: RE | Admit: 2021-03-01 | Discharge: 2021-03-01 | Disposition: A | Discharge status: Home / Self Care | Payer: 59 | Source: Ambulatory Visit | Attending: Family Medicine | Admitting: Family Medicine

## 2021-03-01 ENCOUNTER — Other Ambulatory Visit: Payer: Self-pay | Admitting: Family Medicine

## 2021-03-01 ENCOUNTER — Encounter (HOSPITAL_COMMUNITY)
Admission: RE | Admit: 2021-03-01 | Discharge: 2021-03-01 | Disposition: A | Discharge status: Home / Self Care | Payer: 59 | Source: Ambulatory Visit | Attending: Family Medicine | Admitting: Family Medicine

## 2021-03-01 ENCOUNTER — Other Ambulatory Visit: Payer: Self-pay

## 2021-03-01 DIAGNOSIS — M25551 Pain in right hip: Secondary | ICD-10-CM | POA: Insufficient documentation

## 2021-03-01 DIAGNOSIS — M25552 Pain in left hip: Secondary | ICD-10-CM | POA: Insufficient documentation

## 2021-03-01 IMAGING — NM NM BONE 3 PHASE
10 series · 20 of 20 positions shown · non-contrast
Comparison: Radiographs [DATE]

CLINICAL DATA: Bilateral hip pain.

EXAM:
NUCLEAR MEDICINE 3-PHASE BONE SCAN
TECHNIQUE: Radionuclide angiographic images, immediate static blood pool
images, and 3-hour delayed static images were obtained of the hips
after intravenous injection of radiopharmaceutical.
RADIOPHARMACEUTICALS:  21.2 mCi [2Z] MDP IV

[Series 1: flow · 2.07mm/px · 6 of 48 frames shown (1 of 2)]
[frame 5/48  full-range]
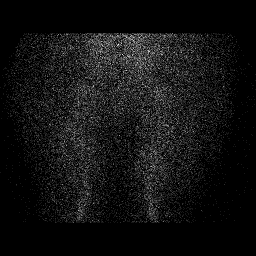
[frame 13/48  full-range]
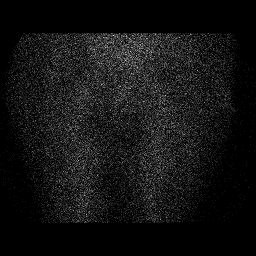
[frame 21/48  full-range]
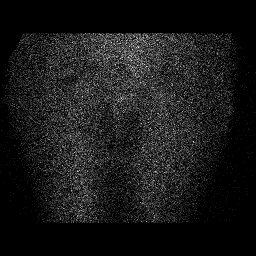
[frame 29/48  full-range]
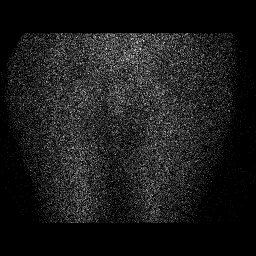
[frame 37/48  full-range]
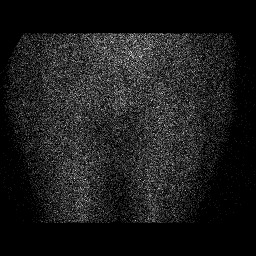
[frame 45/48  full-range]
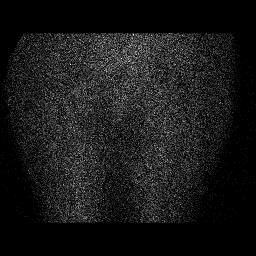

[Series 1: flow · 2.07mm/px · 6 of 48 frames shown (2 of 2)]
[frame 5/48  full-range]
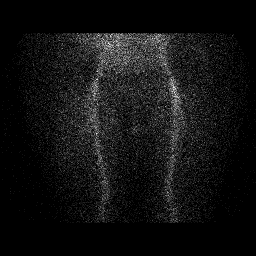
[frame 13/48  full-range]
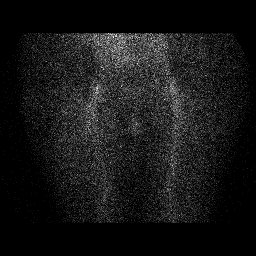
[frame 21/48  full-range]
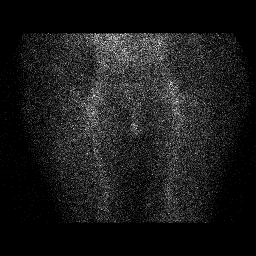
[frame 29/48  full-range]
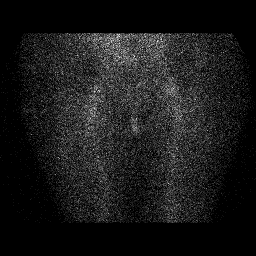
[frame 37/48  full-range]
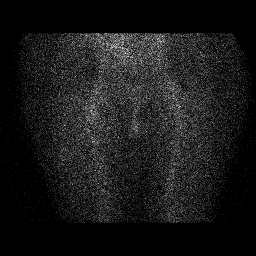
[frame 45/48  full-range]
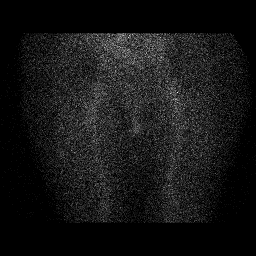

[Series 2: blood pool · 2.07mm/px · 1 of 1 slices shown (1 of 2)]
[im 1/1]
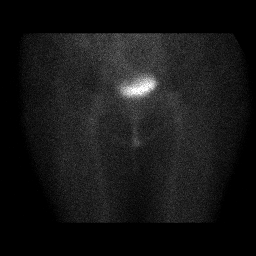

[Series 2: blood pool · 2.07mm/px · 1 of 1 slices shown (2 of 2)]
[im 1/1]
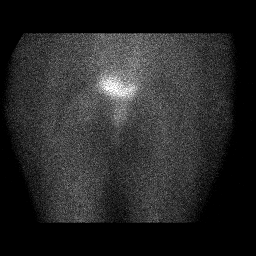

[Series 3: lat bp · 2.07mm/px · 1 of 1 slices shown (1 of 2)]
[im 1/1  full-range]
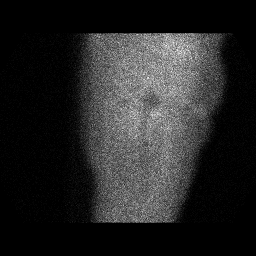

[Series 3: lat bp · 2.07mm/px · 1 of 1 slices shown (2 of 2)]
[im 1/1]
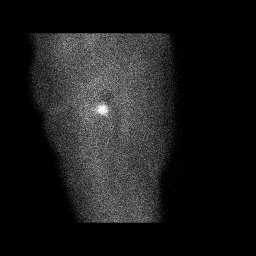

[Series 4: delay · delayed · 2.07mm/px · 1 of 1 slices shown (1 of 4)]
[im 1/1  full-range]
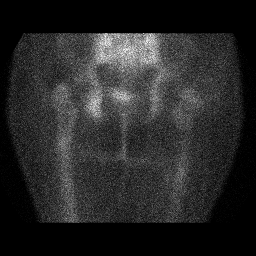

[Series 4: delay · delayed · 2.07mm/px · 1 of 1 slices shown (2 of 4)]
[im 1/1]
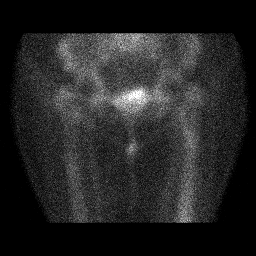

[Series 5: delay · delayed · 2.07mm/px · 1 of 1 slices shown (3 of 4)]
[im 1/1  full-range]
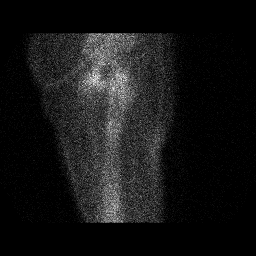

[Series 5: delay · delayed · 2.07mm/px · 1 of 1 slices shown (4 of 4)]
[im 1/1  full-range]
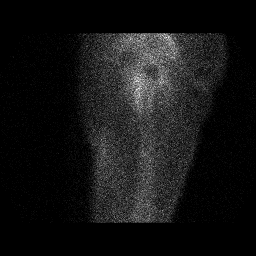

[20 of 20 positions shown; findings below may reference images not displayed]

FINDINGS: Vascular phase: No areas of asymmetric blood flow are identified.

Blood pool phase: No areas of asymmetric uptake are identified.

Delayed phase: No areas of abnormal uptake are identified to suggest
loosening, fracture or infection.
IMPRESSION: Unremarkable three-phase bone scan. No findings suspicious for
loosening, fracture or infection.

## 2021-03-01 MED ORDER — TECHNETIUM TC 99M MEDRONATE IV KIT
20.0000 | PACK | Freq: Once | INTRAVENOUS | Status: AC | PRN
  Administered 2021-03-01: 20 via INTRAVENOUS

## 2021-03-01 NOTE — Addendum Note (Signed)
Addended by: Edwena Felty T on: 03/01/2021 11:52 AM   Modules accepted: Orders

## 2021-03-14 ENCOUNTER — Other Ambulatory Visit: Payer: Self-pay

## 2021-03-14 ENCOUNTER — Ambulatory Visit: Payer: 59 | Admitting: Neurology

## 2021-03-14 ENCOUNTER — Encounter: Payer: Self-pay | Admitting: Neurology

## 2021-03-14 VITALS — BP 149/61 | HR 63 | Resp 20 | Ht 63.0 in | Wt 220.0 lb

## 2021-03-14 DIAGNOSIS — R202 Paresthesia of skin: Secondary | ICD-10-CM

## 2021-03-14 NOTE — Patient Instructions (Addendum)
Nerve testing of the arms.  Do not apply lotion or oil to your skin on the day of testing.   You may try  ELECTROMYOGRAM AND NERVE CONDUCTION STUDIES (EMG/NCS) INSTRUCTIONS  How to Prepare The neurologist conducting the EMG will need to know if you have certain medical conditions. Tell the neurologist and other EMG lab personnel if you: Have a pacemaker or any other electrical medical device Take blood-thinning medications Have hemophilia, a blood-clotting disorder that causes prolonged bleeding Bathing Take a shower or bath shortly before your exam in order to remove oils from your skin. Don't apply lotions or creams before the exam.  What to Expect You'll likely be asked to change into a hospital gown for the procedure and lie down on an examination table. The following explanations can help you understand what will happen during the exam.  Electrodes. The neurologist or a technician places surface electrodes at various locations on your skin depending on where you're experiencing symptoms. Or the neurologist may insert needle electrodes at different sites depending on your symptoms.  Sensations. The electrodes will at times transmit a tiny electrical current that you may feel as a twinge or spasm. The needle electrode may cause discomfort or pain that usually ends shortly after the needle is removed. If you are concerned about discomfort or pain, you may want to talk to the neurologist about taking a short break during the exam.  Instructions. During the needle EMG, the neurologist will assess whether there is any spontaneous electrical activity when the muscle is at rest - activity that isn't present in healthy muscle tissue - and the degree of activity when you slightly contract the muscle.  He or she will give you instructions on resting and contracting a muscle at appropriate times. Depending on what muscles and nerves the neurologist is examining, he or she may ask you to change positions  during the exam.  After your EMG You may experience some temporary, minor bruising where the needle electrode was inserted into your muscle. This bruising should fade within several days. If it persists, contact your primary care doctor.

## 2021-03-14 NOTE — Progress Notes (Signed)
[facility] Clinic Note - Initial Visit   Date: 03/14/21  Olivia Barnes MRN: [id number] DOB: 1955/11/13   Dear Dr. Katrinka Blazing:  Thank you for your kind referral of Olivia Barnes for consultation of bilateral hand tingling. Although her history is well known to you, please allow [place] to reiterate it for the purpose of our medical record. The patient was accompanied to the clinic by self.   History of Present Illness: Olivia Barnes is a 65 y.o. right-handed female with uveitis, hypertension, and hyperlipidemia presenting for evaluation of bilateral hand numbness. Starting around April 2022, she began having tingling and numbness in the hands, along with cold sensation.  It lasts about 20 minutes, occurring several times per day.  There are no specific triggers, it can occur with activity or rest. Symptoms do not wake her up from sleeping.  She occasionally has difficulty opening bottles.  No neck or radicular pain.  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   She has rare spells of in tingling in the feet.  Nothing as bothersome has her hands.  She denies leg weakness, imbalance or falls.   Out-side paper records, electronic medical record, and images have been reviewed where available and summarized as:  Lab Results  Component Value Date   ESRSEDRATE 27 02/22/2020    Past Medical History:  Diagnosis Date   Arthritis    Hyperlipemia    Hypertension     Past Surgical History:  Procedure Laterality Date   arm surgery Left    plate in left arm   CYST EXCISION Left    knee    REPLACEMENT TOTAL HIP W/  RESURFACING IMPLANTS Bilateral    TOENAIL EXCISION Left 12/2020   2nd digit    TOTAL KNEE ARTHROPLASTY Left 07/27/2020   Procedure: LEFT TOTAL KNEE ARTHROPLASTY;  Surgeon: Kathryne Hitch, MD;  Location: [facility];  Service: Orthopedics;  Laterality: Left;     Medications:  Outpatient Encounter Medications as of 03/14/2021  Medication Sig    Adalimumab (HUMIRA) 40 MG/0.4ML PSKT Inject 40 mg into the skin every 14 (fourteen) days.    bimatoprost (LUMIGAN) 0.03 % ophthalmic solution Place 1 drop into both eyes at bedtime.    COMBIGAN 0.2-0.5 % ophthalmic solution Place 1 drop into both eyes 2 (two) times daily.    losartan-hydrochlorothiazide (HYZAAR) 100-12.5 MG tablet Take 1 tablet by mouth daily.   Multiple Vitamin (MULTIVITAMIN PO) Take 1 tablet by mouth daily.    mycophenolate (CELLCEPT) 500 MG tablet Take 1,500 mg by mouth 2 (two) times daily.    PENNSAID 2 % SOLN APPLY 2 PUMPS TOPICALLY TO THE AFFECTED AREA(S) TWICE DAILY AS DIRECTED   polyethylene glycol powder (GLYCOLAX/MIRALAX) 17 GM/SCOOP powder Take 1 Container by mouth daily.    rosuvastatin (CRESTOR) 20 MG tablet Take 20 mg by mouth daily.   tacrolimus (PROGRAF) 1 MG capsule Take 2 mg by mouth 2 (two) times daily.    terbinafine (LAMISIL) 250 MG tablet Take 250 mg by mouth daily. (Patient not taking: Reported on 03/14/2021)   No facility-administered encounter medications on file as of 03/14/2021.    Allergies:  Allergies  Allergen Reactions   Gabapentin Nausea And Vomiting   Meloxicam    Penicillins Rash    Family History: Family History  Problem Relation Age of Onset   Heart disease Mother    Healthy Brother     Social History: Social History   Tobacco Use   Smoking status:  Never   Smokeless tobacco: Never  Vaping Use   Vaping Use: Never used  Substance Use Topics   Alcohol use: Never    Alcohol/week: 0.0 standard drinks   Drug use: Never   Social History   Social History Narrative   Right handed   Two story home   Drinks caffeine    Vital Signs:  BP (!) 149/61   Pulse 63   Resp 20   Ht 5\' 3"  (1.6 m)   Wt 220 lb (99.8 kg)   SpO2 98%   BMI 38.97 kg/m    Neurological Exam: MENTAL STATUS including orientation to time, place, person, recent and remote memory, attention span and concentration, language, and fund of knowledge is  normal.  Speech is not dysarthric.  CRANIAL NERVES: II:  No visual field defects.   III-IV-VI: Pupils equal round and reactive to light.  Normal conjugate, extra-ocular eye movements in all directions of gaze.  No nystagmus.  No ptosis.   V:  Normal facial sensation.    VII:  Normal facial symmetry and movements.   VIII:  Normal hearing and vestibular function.   IX-X:  Normal palatal movement.   XI:  Normal shoulder shrug and head rotation.   XII:  Normal tongue strength and range of motion, no deviation or fasciculation.  MOTOR:  No atrophy, fasciculations or abnormal movements.  No pronator drift.   Upper Extremity:  Right  Left  Deltoid  5/5   5/5   Biceps  5/5   5/5   Triceps  5/5   5/5   Infraspinatus 5/5  5/5  Medial pectoralis 5/5  5/5  Wrist extensors  5/5   5/5   Wrist flexors  5/5   5/5   Finger extensors  5/5   5/5   Finger flexors  5/5   5/5   Dorsal interossei  5/5   5/5   Abductor pollicis  5/5   5/5   Tone (Ashworth scale)  0  0   Lower Extremity:  Right  Left  Hip flexors  5/5   5/5   Hip extensors  5/5   5/5   Adductor 5/5  5/5  Abductor 5/5  5/5  Knee flexors  5/5   5/5   Knee extensors  5/5   5/5   Dorsiflexors  5/5   5/5   Plantarflexors  5/5   5/5   Toe extensors  5/5   5/5   Toe flexors  5/5   5/5   Tone (Ashworth scale)  0  0   MSRs:  Right        Left                  brachioradialis 2+  2+  biceps 2+  2+  triceps 2+  2+  patellar 2+  2+  ankle jerk 2+  2+  Hoffman no  no  plantar response down  down   SENSORY:  Normal and symmetric perception of light touch, pinprick, vibration, and proprioception.  TInel's sign is negative at the wrists.   COORDINATION/GAIT: Normal finger-to- nose-finger.  Intact rapid alternating movements bilaterally.  Gait narrow based and stable. Tandem and stressed gait intact.    IMPRESSION: Bilateral hand numbness/tingling, need to evaluate for entrapment neuropathy vs cervical radiculopathy.  Exam is  reassuring.  - NCS/EMG of the arms  - Gabapentin declined at this time  - Recommend trial of wrist splints  Bilateral feet tingling, intermittent, not bothersome  enough to warrant testing at this time.  Her exam does not show features of neuropathy or radiculopathy.  - If symptoms get worse, electrodiagnostic testing can be performed  Further recommendations pending results.    Thank you for allowing me to participate in patient's care.  If I can answer any additional questions, I would be pleased to do so.    Sincerely,    Ladora Osterberg K. Allena Katz, DO

## 2021-04-11 ENCOUNTER — Ambulatory Visit: Payer: 59 | Admitting: Neurology

## 2021-04-17 ENCOUNTER — Other Ambulatory Visit: Payer: Self-pay

## 2021-04-17 ENCOUNTER — Ambulatory Visit: Payer: 59 | Admitting: Neurology

## 2021-04-17 DIAGNOSIS — G5622 Lesion of ulnar nerve, left upper limb: Secondary | ICD-10-CM

## 2021-04-17 DIAGNOSIS — R202 Paresthesia of skin: Secondary | ICD-10-CM | POA: Diagnosis not present

## 2021-04-17 DIAGNOSIS — G5601 Carpal tunnel syndrome, right upper limb: Secondary | ICD-10-CM

## 2021-04-17 NOTE — Procedures (Signed)
[facility]  [address] [place], [address]  [place], [place] [address] Tel: [phone] Fax:  [phone] Test Date:  04/17/2021  Patient: Olivia Barnes DOB: [birth year] Physician: Nita Sickle, DO  Sex: Female Height: 5\' 3"  Ref Phys: , DO  ID#: Nita Sickle   Technician:    Patient Complaints: This is a 65 year old female referred for evaluation of bilateral hand paresthesias.  NCV & EMG Findings: Extensive electrodiagnostic testing of the right upper extremity and additional studies of the left shows:  Right median sensory response shows prolonged latency (4.5 ms).  Left median, left mixed palmar, and bilateral ulnar sensory responses are within normal limits. Left ulnar motor response shows slowed conduction velocity across the elbow (A Elbow-B Elbow, 43 m/s).  Right ulnar and bilateral median motor responses are within normal limits.   There is no evidence of active or chronic motor axonal loss changes affecting any of the tested muscles.  Motor unit configuration and recruitment pattern is within normal limits.    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   Left ulnar neuropathy with slowing across the elbow, purely demyelinating, mild.   ___________________________ 65, DO    Nerve Conduction Studies Anti Sensory Summary Table   Stim Site NR Peak (ms) Norm Peak (ms) P-T Amp (V) Norm P-T Amp  Left Median Anti Sensory (2nd Digit)  34C  Wrist    3.4 <3.8 29.7 >10  Right Median Anti Sensory (2nd Digit)  34C  Wrist    4.5 <3.8 28.9 >10  Left Ulnar Anti Sensory (5th Digit)  34C  Wrist    2.8 <3.2 30.9 >5  Right Ulnar Anti Sensory (5th Digit)  34C  Wrist    2.7 <3.2 27.4 >5   Motor Summary Table   Stim Site NR Onset (ms) Norm Onset (ms) O-P Amp (mV) Norm O-P Amp Site1 Site2 Delta-0 (ms) Dist (cm) Vel (m/s) Norm Vel (m/s)  Left Median Motor (Abd Poll  Brev)  34C  Wrist    3.0 <4.0 9.8 >5 Elbow Wrist 4.8 27.0 56 >50  Elbow    7.8  9.4         Right Median Motor (Abd Poll Brev)  34C  Wrist    3.5 <4.0 10.8 >5 Elbow Wrist 4.9 27.0 55 >50  Elbow    8.4  9.6         Left Ulnar Motor (Abd Dig Minimi)  34C  Wrist    2.3 <3.1 7.9 >7 B Elbow Wrist 3.6 22.0 61 >50  B Elbow    5.9  7.2  A Elbow B Elbow 2.3 10.0 43 >50  A Elbow    8.2  7.1         Right Ulnar Motor (Abd Dig Minimi)  34C  Wrist    2.0 <3.1 10.0 >7 B Elbow Wrist 3.5 22.0 63 >50  B Elbow    5.5  8.6  A Elbow B Elbow 1.8 10.0 56 >50  A Elbow    7.3  7.9          Comparison Summary Table   Stim Site NR Peak (ms) Norm Peak (ms) P-T Amp (V) Site1 Site2 Delta-P (ms) Norm Delta (ms)  Left Median/Ulnar Palm Comparison (Wrist - 8cm)  34C  Median Palm    1.9 <2.2 33.1 Median Palm Ulnar Palm 0.2   Ulnar Palm    1.7 <2.2 12.7  EMG   Side Muscle Ins Act Fibs Psw Fasc Number Recrt Dur Dur. Amp Amp. Poly Poly. Comment  Right 1stDorInt Nml Nml Nml Nml Nml Nml Nml Nml Nml Nml Nml Nml N/A  Right Abd Poll Brev Nml Nml Nml Nml Nml Nml Nml Nml Nml Nml Nml Nml N/A  Right PronatorTeres Nml Nml Nml Nml Nml Nml Nml Nml Nml Nml Nml Nml N/A  Right Biceps Nml Nml Nml Nml Nml Nml Nml Nml Nml Nml Nml Nml N/A  Right Triceps Nml Nml Nml Nml Nml Nml Nml Nml Nml Nml Nml Nml N/A  Right Deltoid Nml Nml Nml Nml Nml Nml Nml Nml Nml Nml Nml Nml N/A  Left 1stDorInt Nml Nml Nml Nml Nml Nml Nml Nml Nml Nml Nml Nml N/A  Left PronatorTeres Nml Nml Nml Nml Nml Nml Nml Nml Nml Nml Nml Nml N/A  Left Biceps Nml Nml Nml Nml Nml Nml Nml Nml Nml Nml Nml Nml N/A  Left Triceps Nml Nml Nml Nml Nml Nml Nml Nml Nml Nml Nml Nml N/A  Left Deltoid Nml Nml Nml Nml Nml Nml Nml Nml Nml Nml Nml Nml N/A  Left ABD Dig Min Nml Nml Nml Nml Nml Nml Nml Nml Nml Nml Nml Nml N/A  Left FlexCarpiUln Nml Nml Nml Nml Nml Nml Nml Nml Nml Nml Nml Nml N/A      Waveforms:

## 2021-05-17 NOTE — Progress Notes (Signed)
[name] [address] [place] [address] Phone: [phone] Subjective:   [name], am serving as a scribe for Dr. Antoine Primas. This visit occurred during the SARS-CoV-2 public health emergency.  Safety protocols were in place, including screening questions prior to the visit, additional usage of staff PPE, and extensive cleaning of exam room while observing appropriate contact time as indicated for disinfecting solutions.   I'm seeing this patient by the request  of:  [name], MD  CC: Left hip pain and back pain  RWE:[id number]  02/07/2021 Continued pain in the hips.  Patient has some pain that seems to be mostly posterior but then seems to radiate anteriorly sometimes.  He does have some signs and symptoms consistent with the possibility of lumbar radiculopathy with mild positive straight leg test as well.  This is affecting daily activities as well.  At this point I would like to get a bone scan to make sure that there is no significant loosening of the hips bilaterally.  We will also get a lumbar MRI to further evaluate for any type of nerve impingement.  Patient does have an L4-L5 spondylolisthesis that could be contributing.  Depending on findings we will discuss management thereafter.  Updated on 05/20/2021 Persephone Schriever is a 65 y.o. female coming in with complaint of right hip pain. States pain got a little better, but overall remains the same.  States continues to have pain seems to be more of the hip than anywhere else.  Seems to be on the lateral aspect. Was seen by another provider ago and given an injection.  States that that did not make any significant improvement.  MRI 6/25/2022IMPRESSION: Lumbar spondylosis, as outlined and with findings most notably as follows.   At L4-L5, there is trace grade 1 anterolisthesis. Mild/moderate disc degeneration. Multifactorial bilateral subarticular narrowing (mild right, moderate  left) with crowding of the left greater than right descending L5 nerve roots. Moderate bilateral neural foraminal narrowing.   At L3-L4, there is multifactorial mild bilateral subarticular stenosis with slight crowding of the descending L4 nerve roots. Mild relative central canal narrowing. Bilateral neural foraminal narrowing (mild/moderate right, moderate left).   At L5-S1, a small right subarticular disc protrusion contributes to moderate right subarticular stenosis, encroaching upon the descending right S1 nerve root. Correlate for right S1 radiculopathy. Mild relative right neural foraminal narrowing.   No more than mild relative spinal canal or neural foraminal narrowing at the remaining levels.   Lumbar levocurvature.      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    TOENAIL EXCISION Left 12/2020   2nd digit    TOTAL KNEE ARTHROPLASTY Left 07/27/2020   Procedure: LEFT TOTAL KNEE ARTHROPLASTY;  Surgeon: Kathryne Hitch, MD;  Location: [facility];  Service: Orthopedics;  Laterality: Left;   Social History   Socioeconomic History   Marital status: Married    Spouse name: Not on file   Number of children: Not on file   Years of education: Not on file   Highest education level: Not on file  Occupational History   Not on file  Tobacco Use   Smoking status: Never  Smokeless tobacco: Never  Vaping Use   Vaping Use: Never used  Substance and Sexual Activity   Alcohol use: Never    Alcohol/week: 0.0 standard drinks   Drug use: Never   Sexual activity: Not on file  Other Topics Concern   Not on file  Social History Narrative   Right handed   Two story home   Drinks caffeine   Social Determinants of Health   Financial Resource  Strain: Not on file  Food Insecurity: Not on file  Transportation Needs: Not on file  Physical Activity: Not on file  Stress: Not on file  Social Connections: Not on file   Allergies  Allergen Reactions   Gabapentin Nausea And Vomiting   Meloxicam    Penicillins Rash   Family History  Problem Relation Age of Onset   Heart disease Mother    Healthy Brother      Current Outpatient Medications (Cardiovascular):    losartan-hydrochlorothiazide (HYZAAR) 100-12.5 MG tablet, Take 1 tablet by mouth daily.   rosuvastatin (CRESTOR) 20 MG tablet, Take 20 mg by mouth daily.   Current Outpatient Medications (Analgesics):    Adalimumab (HUMIRA) 40 MG/0.4ML PSKT, Inject 40 mg into the skin every 14 (fourteen) days.    Current Outpatient Medications (Other):    bimatoprost (LUMIGAN) 0.03 % ophthalmic solution, Place 1 drop into both eyes at bedtime.    COMBIGAN 0.2-0.5 % ophthalmic solution, Place 1 drop into both eyes 2 (two) times daily.    Multiple Vitamin (MULTIVITAMIN PO), Take 1 tablet by mouth daily.    mycophenolate (CELLCEPT) 500 MG tablet, Take 1,500 mg by mouth 2 (two) times daily.    PENNSAID 2 % SOLN, APPLY 2 PUMPS TOPICALLY TO THE AFFECTED AREA(S) TWICE DAILY AS DIRECTED   polyethylene glycol powder (GLYCOLAX/MIRALAX) 17 GM/SCOOP powder, Take 1 Container by mouth daily.    tacrolimus (PROGRAF) 1 MG capsule, Take 2 mg by mouth 2 (two) times daily.    terbinafine (LAMISIL) 250 MG tablet, Take 250 mg by mouth daily. (Patient not taking: Reported on 03/14/2021)   Reviewed prior external information including notes and imaging from  primary care provider As well as notes that were available from care everywhere and other healthcare systems.  Past medical history, social, surgical and family history all reviewed in electronic medical record.  No pertanent information unless stated regarding to the chief complaint.   Review of Systems:  No headache, visual changes, nausea,  vomiting, diarrhea, constipation, dizziness, abdominal pain, skin rash, fevers, chills, night sweats, weight loss, swollen lymph nodes, body aches, joint swelling, chest pain, shortness of breath, mood changes. POSITIVE muscle aches  Objective  Blood pressure (!) 160/88, pulse 74, height 5\' 3"  (1.6 m), weight 218 lb (98.9 kg), SpO2 96 %.   General: No apparent distress alert and oriented x3 mood and affect normal, dressed appropriately.  HEENT: Pupils equal, extraocular movements intact  Respiratory: Patient's speak in full sentences and does not appear short of breath  Cardiovascular: No lower extremity edema, non tender, no erythema  Gait antalgic Patient's low back is tender to palpation of the paraspinal musculature bilaterally but seems to be a little more left greater than right.  Tightness noted the straight leg test left greater than right.  Difficulty to do secondary to body habitus.     Impression and Recommendations:     The above documentation has been reviewed and is accurate and complete Pearlean Brownie, DO

## 2021-05-20 ENCOUNTER — Ambulatory Visit: Payer: 59 | Admitting: Family Medicine

## 2021-05-20 ENCOUNTER — Encounter: Payer: Self-pay | Admitting: Family Medicine

## 2021-05-20 ENCOUNTER — Other Ambulatory Visit: Payer: Self-pay

## 2021-05-20 VITALS — BP 160/88 | HR 74 | Ht 63.0 in | Wt 218.0 lb

## 2021-05-20 DIAGNOSIS — M5416 Radiculopathy, lumbar region: Secondary | ICD-10-CM | POA: Diagnosis not present

## 2021-05-20 NOTE — Assessment & Plan Note (Signed)
I do believe that patient's pain now at this point not responding to the injections after the surgical intervention of the hip as well as the knee I do feel that we should consider the possibility of a lumbar radiculopathy.  Patient did have the MRI showing the patient does have possible nerve root impingement at L3-L4 that would be consistent with a potential findings that patient radicular symptoms patient is having.  Patient will have an L3-L4 epidural to see how patient responds.  Discussed icing regimen and home exercises otherwise.  Follow-up with me again 6 to 8 weeks.

## 2021-05-20 NOTE — Patient Instructions (Addendum)
No large changes with medications See you again 4 weeks after epidural

## 2021-05-30 ENCOUNTER — Telehealth: Payer: Self-pay | Admitting: Family Medicine

## 2021-05-30 NOTE — Telephone Encounter (Signed)
Victorino Dike with [facility] called stating that the patient will need a peer to peer for approval of the Epidural Injection.

## 2021-05-31 NOTE — Telephone Encounter (Signed)
Scheduled for Monday at 1230

## 2021-06-03 NOTE — Telephone Encounter (Signed)
Left message informing Olivia Barnes.

## 2021-06-07 ENCOUNTER — Encounter: Payer: Self-pay | Admitting: Family Medicine

## 2021-06-09 ENCOUNTER — Encounter: Payer: Self-pay | Admitting: Family Medicine

## 2021-06-11 ENCOUNTER — Other Ambulatory Visit: Payer: Self-pay

## 2021-06-11 ENCOUNTER — Ambulatory Visit
Admission: RE | Admit: 2021-06-11 | Discharge: 2021-06-11 | Disposition: A | Discharge status: Home / Self Care | Payer: 59 | Source: Ambulatory Visit | Attending: Family Medicine | Admitting: Family Medicine

## 2021-06-11 DIAGNOSIS — M5416 Radiculopathy, lumbar region: Secondary | ICD-10-CM

## 2021-06-11 IMAGING — XA Imaging study
3 series · 3 of 3 positions shown · non-contrast
Comparison: none

CLINICAL DATA: Low back pain

[Series 1: ortho adipose · 1 of 1 slices shown (1 of 3)]
[im 1/1]
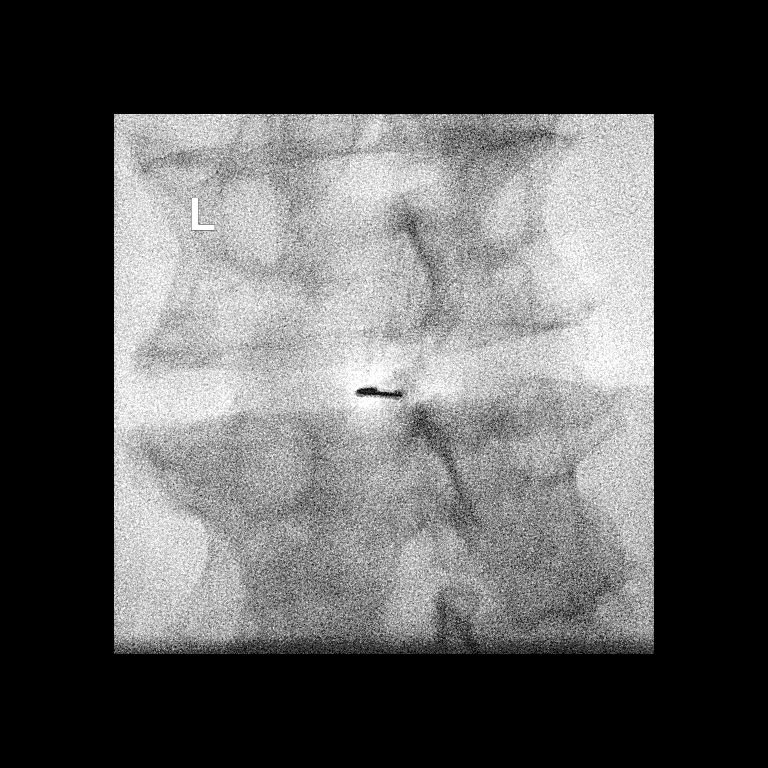

[Series 2: ortho adipose · 1 of 1 slices shown (2 of 3)]
[im 1/1]
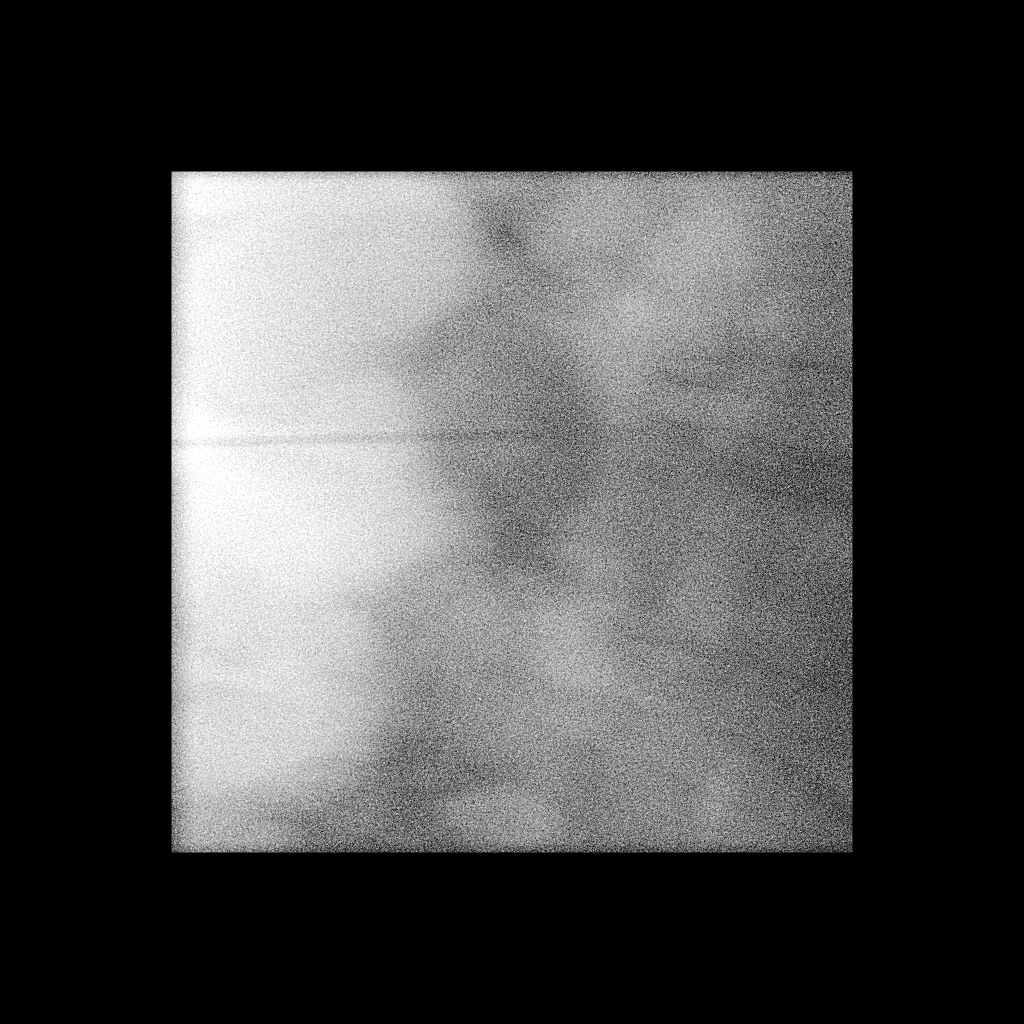

[Series 3: ortho adipose · 1 of 1 slices shown (3 of 3)]
[im 1/1]
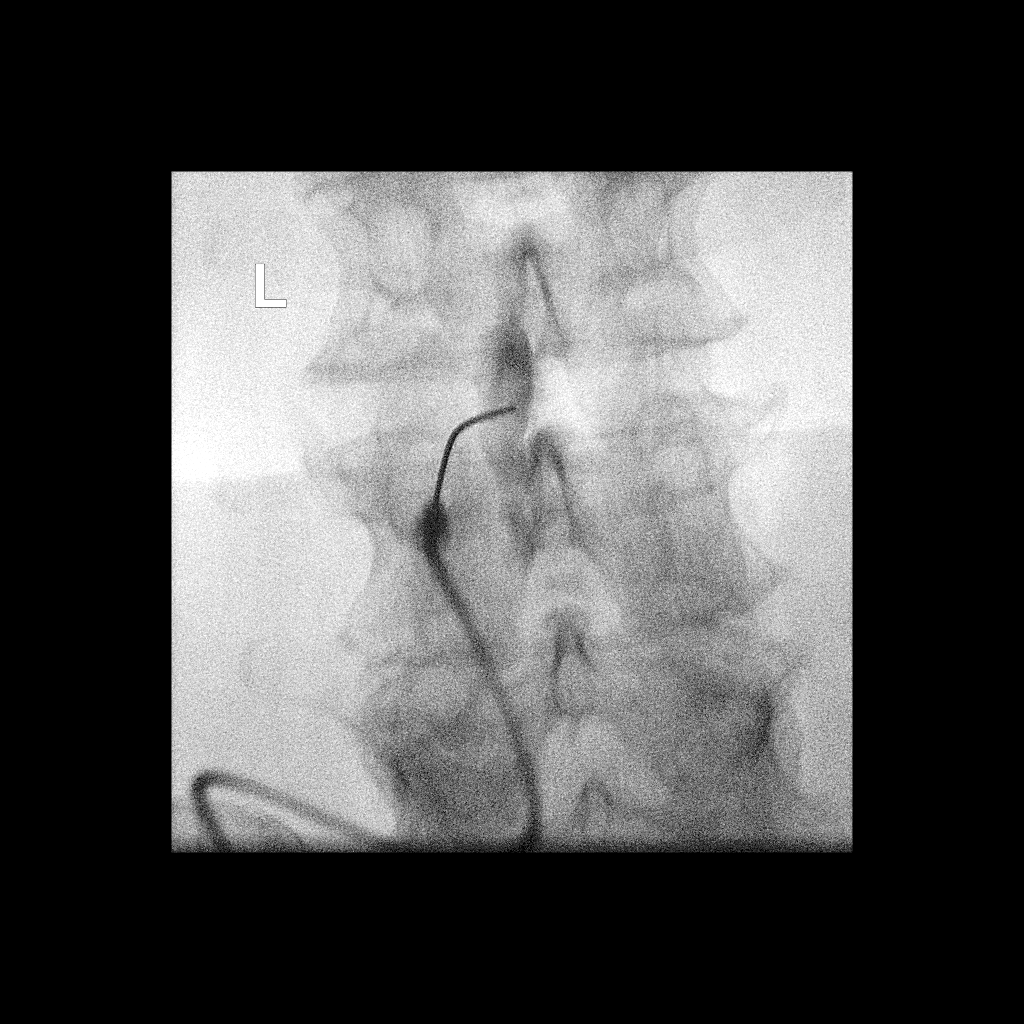

[3 of 3 positions shown; findings below may reference images not displayed]

FLUOROSCOPY TIME:  1 minute 14 seconds with 3 exposures

PROCEDURE:
The procedure, risks, benefits, and alternatives were explained to
the patient. Questions regarding the procedure were encouraged and
answered. The patient understands and consents to the procedure.

LUMBAR EPIDURAL INJECTION:

An interlaminar approach was performed on left at L3-L4. The
overlying skin was cleansed and anesthetized. A 20 gauge epidural
needle was advanced using loss-of-resistance technique.

DIAGNOSTIC EPIDURAL INJECTION:

Injection of Isovue-M 200 shows a good epidural pattern with spread
above and below the level of needle placement, primarily on the
left. No vascular opacification is seen.

THERAPEUTIC EPIDURAL INJECTION:

80 mg of Depo-Medrol mixed with 3 mL 1% lidocaine were instilled.
The procedure was well-tolerated, and the patient was discharged
thirty minutes following the injection in good condition.

COMPLICATIONS:
None immediate
IMPRESSION: Technically successful epidural injection on the left L3-L4 # 1.

## 2021-06-11 MED ORDER — IOPAMIDOL (ISOVUE-M 200) INJECTION 41%
1.0000 mL | Freq: Once | INTRAMUSCULAR | Status: AC
  Administered 2021-06-11: 1 mL via EPIDURAL

## 2021-06-11 MED ORDER — METHYLPREDNISOLONE ACETATE 40 MG/ML INJ SUSP (RADIOLOG
80.0000 mg | Freq: Once | INTRAMUSCULAR | Status: AC
  Administered 2021-06-11: 80 mg via EPIDURAL

## 2021-06-11 NOTE — Discharge Instructions (Signed)

## 2021-06-28 NOTE — Progress Notes (Deleted)
Office Visit Note  Patient: Olivia Barnes             Date of Birth: [birth year]           MRN: [id number]             PCP: Charleston Poot, MD Referring: Charleston Poot, MD Visit Date: 07/12/2021 Occupation: @GUAROCC @  Subjective:  No chief complaint on file.   History of Present Illness: Olivia Barnes is a 65 y.o. female ***   Activities of Daily Living:  Patient reports morning stiffness for *** {minute/hour:19697}.   Patient {ACTIONS;DENIES/REPORTS:21021675::"Denies"} nocturnal pain.  Difficulty dressing/grooming: {ACTIONS;DENIES/REPORTS:21021675::"Denies"} Difficulty climbing stairs: {ACTIONS;DENIES/REPORTS:21021675::"Denies"} Difficulty getting out of chair: {ACTIONS;DENIES/REPORTS:21021675::"Denies"} Difficulty using hands for taps, buttons, cutlery, and/or writing: {ACTIONS;DENIES/REPORTS:21021675::"Denies"}  No Rheumatology ROS completed.   PMFS History:  Patient Active Problem List   Diagnosis Date Noted  . AC (acromioclavicular) arthritis 12/27/2020  . Greater trochanteric bursitis of right hip 12/27/2020  . Status post total left knee replacement 07/27/2020  . Degenerative arthritis of right knee 07/04/2020  . Neck muscle spasm 06/05/2020  . Unilateral primary osteoarthritis, left knee 05/22/2020  . Uveitis 01/11/2020  . Acute bursitis of right shoulder 12/06/2019  . Baker's cyst of knee, left 09/30/2019  . Ganglion of left knee 03/11/2019  . Tear of left hamstring 03/01/2019  . 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 Lumbar radiculopathy 03/20/2016  . Arthritis of right hip 10/20/2014  . Septic arthritis ([place]) 07/13/2014  . Transient synovitis of wrist 07/13/2014  . CN (constipation) 07/13/2014  . Essential hypertension 07/13/2014  . Primary osteoarthritis of right hip 07/13/2014    Past Medical History:  Diagnosis Date  . Arthritis   . Hyperlipemia   . Hypertension     Family History  Problem  Relation Age of Onset  . Heart disease Mother   . Healthy Brother    Past Surgical History:  Procedure Laterality Date  . arm surgery Left    plate in left arm  . CYST EXCISION Left    knee   . 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 TOENAIL EXCISION Left 12/2020   2nd digit   . TOTAL KNEE ARTHROPLASTY Left 07/27/2020   Procedure: LEFT TOTAL KNEE ARTHROPLASTY;  Surgeon: Mcarthur Rossetti, MD;  Location: [facility];  Service: Orthopedics;  Laterality: Left;   Social History   Social History Narrative   Right handed   Two story home   Drinks caffeine   Immunization History  Administered Date(s) Administered  . PFIZER(Purple Top)SARS-COV-2 Vaccination 09/15/2019, 10/04/2019, 06/08/2020     Objective: Vital Signs: There were no vitals taken for this visit.   Physical Exam   Musculoskeletal Exam: ***  CDAI Exam: CDAI Score: -- Patient Global: --; Provider Global: -- Swollen: --; Tender: -- Joint Exam 07/12/2021   No joint exam has been documented for this visit   There is currently no information documented on the homunculus. Go to the Rheumatology activity and complete the homunculus joint exam.  Investigation: No additional findings.  Imaging: DG INJECT DIAG/THERA/INC NEEDLE/CATH/PLC EPI/LUMB/SAC W/IMG  Result Date: 06/11/2021 CLINICAL DATA:  Low back pain FLUOROSCOPY TIME:  1 minute 14 seconds with 3 exposures PROCEDURE: The procedure, risks, benefits, and alternatives were explained to the patient. Questions regarding the procedure were encouraged and answered. The patient understands and consents to the procedure. LUMBAR EPIDURAL INJECTION: An interlaminar approach was performed on left at L3-L4. The overlying skin  was cleansed and anesthetized. A 20 gauge epidural needle was advanced using loss-of-resistance technique. DIAGNOSTIC EPIDURAL INJECTION: Injection of Isovue-M 200 shows a good epidural pattern with spread above and below the level  of needle placement, primarily on the left. No vascular opacification is seen. THERAPEUTIC EPIDURAL INJECTION: 80 mg of Depo-Medrol mixed with 3 mL 1% lidocaine were instilled. The procedure was well-tolerated, and the patient was discharged thirty minutes following the injection in good condition. COMPLICATIONS: None immediate IMPRESSION: Technically successful epidural injection on the left L3-L4 # 1. Electronically Signed   By: Albin Felling M.D.   On: 06/11/2021 12:06    Recent Labs: Lab Results  Component Value Date   WBC 8.3 07/28/2020   HGB 10.9 (L) 07/28/2020   PLT 140 (L) 07/28/2020   NA 135 07/28/2020   K 4.3 07/28/2020   CL 103 07/28/2020   CO2 23 07/28/2020   GLUCOSE 145 (H) 07/28/2020   BUN 18 07/28/2020   CREATININE 0.63 07/28/2020   CALCIUM 8.5 (L) 07/28/2020   GFRAA  07/06/2010    >60        The eGFR has been calculated using the MDRD equation. This calculation has not been validated in all clinical situations. eGFR's persistently <60 mL/min signify possible Chronic Kidney Disease.    Speciality Comments: No specialty comments available.  Procedures:  No procedures performed Allergies: Gabapentin, Meloxicam, and Penicillins   Assessment / Plan:     Visit Diagnoses: Panuveitis of both eyes  Inflammatory arthritis  High risk medication use  Chronic right shoulder pain  History of total hip replacement, bilateral  Status post total knee replacement, left  Lumbar radiculopathy  History of glaucoma  Hypertensive retinopathy of both eyes  Essential hypertension  Dyslipidemia  Orders: No orders of the defined types were placed in this encounter.  No orders of the defined types were placed in this encounter.   Face-to-face time spent with patient was *** minutes. Greater than 50% of time was spent in counseling and coordination of care.  Follow-Up Instructions: No follow-ups on file.   Ofilia Neas, PA-C  Note - This record has been  created using Dragon software.  Chart creation errors have been sought, but may not always  have been located. Such creation errors do not reflect on  the standard of medical care.

## 2021-07-03 ENCOUNTER — Encounter: Payer: Self-pay | Admitting: Orthopaedic Surgery

## 2021-07-03 ENCOUNTER — Other Ambulatory Visit: Payer: Self-pay

## 2021-07-03 ENCOUNTER — Ambulatory Visit: Payer: 59 | Admitting: Orthopaedic Surgery

## 2021-07-03 ENCOUNTER — Ambulatory Visit: Payer: Self-pay

## 2021-07-03 DIAGNOSIS — Z96652 Presence of left artificial knee joint: Secondary | ICD-10-CM | POA: Diagnosis not present

## 2021-07-03 NOTE — Progress Notes (Signed)
The patient is getting close to a year out from a left total knee arthroplasty.  She says she just has a little bit of pain here and there but says she has good range of motion and strength and it does not bother her on a daily basis.  She has no issues with the right knee.  Her left operative knee shows a well-healed incision.  There is no swelling or effusion.  Her range of motion is full.  The knee is ligamentously stable.  2 views left knee show a total knee arthroplasty with no complicating features and in good alignment.  This point follow-up can be as needed for her left knee.  If she has any issues with the knee at any time and develops swelling or other problems she knows to let us know.  All questions and concerns were answered and addressed.

## 2021-07-12 ENCOUNTER — Ambulatory Visit: Payer: 59 | Admitting: Rheumatology

## 2021-07-12 DIAGNOSIS — M1990 Unspecified osteoarthritis, unspecified site: Secondary | ICD-10-CM

## 2021-07-12 DIAGNOSIS — Z8669 Personal history of other diseases of the nervous system and sense organs: Secondary | ICD-10-CM

## 2021-07-12 DIAGNOSIS — M5416 Radiculopathy, lumbar region: Secondary | ICD-10-CM

## 2021-07-12 DIAGNOSIS — I10 Essential (primary) hypertension: Secondary | ICD-10-CM

## 2021-07-12 DIAGNOSIS — G8929 Other chronic pain: Secondary | ICD-10-CM

## 2021-07-12 DIAGNOSIS — E785 Hyperlipidemia, unspecified: Secondary | ICD-10-CM

## 2021-07-12 DIAGNOSIS — Z79899 Other long term (current) drug therapy: Secondary | ICD-10-CM

## 2021-07-12 DIAGNOSIS — Z96652 Presence of left artificial knee joint: Secondary | ICD-10-CM

## 2021-07-12 DIAGNOSIS — Z96643 Presence of artificial hip joint, bilateral: Secondary | ICD-10-CM

## 2021-07-12 DIAGNOSIS — H44113 Panuveitis, bilateral: Secondary | ICD-10-CM

## 2021-07-12 DIAGNOSIS — H35033 Hypertensive retinopathy, bilateral: Secondary | ICD-10-CM

## 2021-07-23 ENCOUNTER — Ambulatory Visit: Payer: 59 | Admitting: Family Medicine

## 2021-08-06 ENCOUNTER — Ambulatory Visit: Payer: 59 | Admitting: Family Medicine

## 2021-08-06 NOTE — Progress Notes (Signed)
[facility] [address] [place] [address] Phone: [phone] Subjective:   [name], am serving as a scribe for Dr. Antoine Primas. This visit occurred during the SARS-CoV-2 public health emergency.  Safety protocols were in place, including screening questions prior to the visit, additional usage of staff PPE, and extensive cleaning of exam room while observing appropriate contact time as indicated for disinfecting solutions.   I'm seeing this patient by the request  of:  [name], MD  CC: Back pain follow-up  EHM:[id number]  05/20/2021 do believe that patient's pain now at this point not responding to the injections after the surgical intervention of the hip as well as the knee I do feel that we should consider the possibility of a lumbar radiculopathy.  Patient did have the MRI showing the patient does have possible nerve root impingement at L3-L4 that would be consistent with a potential findings that patient radicular symptoms patient is having.  Patient will have an L3-L4 epidural to see how patient responds.  Discussed icing regimen and home exercises otherwise.  Follow-up with me again 6 to 8 weeks.  Updated 08/07/2021 Olivia Barnes is a 65 y.o. female coming in with complaint of back pain.  MRI showed potential L3-L4 nerve root impingement.  Epi on 06/11/2021. Epidural didn't help at all. Pain remains the same, location and type. Right thumb has been sore, started 2 days ago. Been in pt as well for hip and knee.  Bilateral hips replacements did not show any significant loosening on bone scan in July. 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  On Humira at this moment.    Past Medical History:  Diagnosis Date   Arthritis    Hyperlipemia    Hypertension    Past Surgical History:  Procedure Laterality Date   arm surgery Left    plate in left arm   CYST EXCISION Left     knee    REPLACEMENT TOTAL HIP W/  RESURFACING IMPLANTS Bilateral    TOENAIL EXCISION Left 12/2020   2nd digit    TOTAL KNEE ARTHROPLASTY Left 07/27/2020   Procedure: LEFT TOTAL KNEE ARTHROPLASTY;  Surgeon: Kathryne Hitch, MD;  Location: [facility];  Service: Orthopedics;  Laterality: Left;   Social History   Socioeconomic History   Marital status: Married    Spouse name: Not on file   Number of children: Not on file   Years of education: Not on file   Highest education level: Not on file  Occupational History   Not on file  Tobacco Use   Smoking status: Never   Smokeless tobacco: Never  Vaping Use   Vaping Use: Never used  Substance and Sexual Activity   Alcohol use: Never    Alcohol/week: 0.0 standard drinks   Drug use: Never   Sexual activity: Not on file  Other Topics Concern   Not on file  Social History Narrative   Right handed   Two story home   Drinks caffeine   Social Determinants of Health   Financial Resource Strain: Not on file  Food Insecurity: Not on file  Transportation Needs: Not on file  Physical Activity: Not on file  Stress: Not on file  Social Connections: Not on file   Allergies  Allergen Reactions   Gabapentin Nausea And Vomiting   Meloxicam    Penicillins Rash   Family History  Problem Relation Age of  Onset   Heart disease Mother    Healthy Brother      Current Outpatient Medications (Cardiovascular):    losartan-hydrochlorothiazide (HYZAAR) 100-12.5 MG tablet, Take 1 tablet by mouth daily.   rosuvastatin (CRESTOR) 20 MG tablet, Take 20 mg by mouth daily.   Current Outpatient Medications (Analgesics):    Adalimumab (HUMIRA) 40 MG/0.4ML PSKT, Inject 40 mg into the skin every 14 (fourteen) days.    Current Outpatient Medications (Other):    bimatoprost (LUMIGAN) 0.03 % ophthalmic solution, Place 1 drop into both eyes at bedtime.    COMBIGAN 0.2-0.5 % ophthalmic solution, Place 1 drop into both eyes 2 (two) times daily.     Multiple Vitamin (MULTIVITAMIN PO), Take 1 tablet by mouth daily.    mycophenolate (CELLCEPT) 500 MG tablet, Take 1,500 mg by mouth 2 (two) times daily.    PENNSAID 2 % SOLN, APPLY 2 PUMPS TOPICALLY TO THE AFFECTED AREA(S) TWICE DAILY AS DIRECTED   polyethylene glycol powder (GLYCOLAX/MIRALAX) 17 GM/SCOOP powder, Take 1 Container by mouth daily.    tacrolimus (PROGRAF) 1 MG capsule, Take 2 mg by mouth 2 (two) times daily.    terbinafine (LAMISIL) 250 MG tablet, Take 250 mg by mouth daily. (Patient not taking: Reported on 03/14/2021)   Reviewed prior external information including notes and imaging from  primary care provider As well as notes that were available from care everywhere and other healthcare systems.  Past medical history, social, surgical and family history all reviewed in electronic medical record.  No pertanent information unless stated regarding to the chief complaint.   Review of Systems:  No headache, visual changes, nausea, vomiting, diarrhea, constipation, dizziness, abdominal pain, skin rash, fevers, chills, night sweats, weight loss, swollen lymph nodes, body aches, joint swelling, chest pain, shortness of breath, mood changes. POSITIVE muscle aches  Objective  There were no vitals taken for this visit.   General: No apparent distress alert and oriented x3 mood and affect normal, dressed appropriately.  HEENT: Pupils equal, extraocular movements intact  Respiratory: Patient's speak in full sentences and does not appear short of breath  Cardiovascular: No lower extremity edema, non tender, no erythema  Antalgic gait noted. Patient is tender to palpation over the greater trochanteric area on the left side.  Patient is also severely tender to palpation over the sacroiliac joint.  Patient does have tightness noted in the paraspinal region of the lumbar spine.  Tightness with straight leg test left greater than right as well.  After verbal consent patient was prepped in  sterile fashion with alcohol swabs. Ethyl chloride used patient was injected with a 22-gauge 3 inch needle into the left lateral hip in the greater trochanteric area under ultrasound guidance. Picture was taken. Patient had 4 cc of 0.5% Marcaine and 1 cc of Kenalog 40 mg/dL injected. Patient tolerated the procedure well and no blood loss. Pain completely resolved after injection stating proper placement. Post injection instructions given.   After verbal consent patient was prepped with alcohol swabs and was injected with a 21-gauge 2 inch needle into the left sacroiliac joint with a total of 1 cc of 0.5% Marcaine and 1 cc of Kenalog 40 mg/mL no blood loss.  Band-Aid placed.       Impression and Recommendations:     The above documentation has been reviewed and is accurate and complete Judi Saa, DO

## 2021-08-07 ENCOUNTER — Ambulatory Visit: Payer: 59 | Admitting: Family Medicine

## 2021-08-07 ENCOUNTER — Other Ambulatory Visit: Payer: Self-pay

## 2021-08-07 DIAGNOSIS — M25552 Pain in left hip: Secondary | ICD-10-CM | POA: Diagnosis not present

## 2021-08-07 DIAGNOSIS — M533 Sacrococcygeal disorders, not elsewhere classified: Secondary | ICD-10-CM | POA: Diagnosis not present

## 2021-08-07 DIAGNOSIS — G8929 Other chronic pain: Secondary | ICD-10-CM | POA: Diagnosis not present

## 2021-08-07 DIAGNOSIS — G8928 Other chronic postprocedural pain: Secondary | ICD-10-CM

## 2021-08-07 DIAGNOSIS — M1811 Unilateral primary osteoarthritis of first carpometacarpal joint, right hand: Secondary | ICD-10-CM | POA: Insufficient documentation

## 2021-08-07 DIAGNOSIS — M25551 Pain in right hip: Secondary | ICD-10-CM

## 2021-08-07 DIAGNOSIS — Z96643 Presence of artificial hip joint, bilateral: Secondary | ICD-10-CM | POA: Diagnosis not present

## 2021-08-07 NOTE — Patient Instructions (Signed)
Injections today Hope they calm it down Wear brace nightly for 2 weeks See you again in 4-6 weeks

## 2021-08-07 NOTE — Assessment & Plan Note (Signed)
Patient given injection today, tolerated the procedure well, discussed icing regimen and home exercises.  Patient did not respond well to the epidural in the back previously.  If this does not seem to make a significant difference and I do think we need to consider the possibility of injection at L4-L5 and see if this will be beneficial.  Increase activity as tolerated.  Follow-up again in 4 to 8 weeks

## 2021-08-07 NOTE — Assessment & Plan Note (Signed)
Thumb spica splint given today.  Patient will try to wear this and increase activity slowly.  Follow-up again in 4 weeks.  Worsening pain consider injection.

## 2021-09-09 NOTE — Progress Notes (Signed)
[name] [address] [place] [address] Phone: [phone] Subjective:   Olivia Barnes, am serving as a scribe for Dr. Antoine Primas. This visit occurred during the SARS-CoV-2 public health emergency.  Safety protocols were in place, including screening questions prior to the visit, additional usage of staff PPE, and extensive cleaning of exam room while observing appropriate contact time as indicated for disinfecting solutions.   I'm seeing this patient by the request  of:  [name], MD  CC: Thumb pain follow-up  [id number]  08/07/2021 Thumb spica splint given today.  Patient will try to wear this and increase activity slowly.  Follow-up again in 4 weeks.  Worsening pain consider injection.  Patient given injection today, tolerated the procedure well, discussed icing regimen and home exercises.  Patient did not respond well to the epidural in the back previously.  If this does not seem to make a significant difference and I do think we need to consider the possibility of injection at L4-L5 and see if this will be beneficial.  Increase activity as tolerated.  Follow-up again in 4 to 8 weeks  Updated 09/10/2021 Olivia Barnes is a 66 y.o. female coming in with complaint of right wrist pain. Doing better since last visit. Pain is less intense and less often. No new complaints patient actually feels like she is doing relatively better at the moment.  Patient is happy with this.  No new symptoms.       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    TOENAIL EXCISION Left 12/2020   2nd digit    TOTAL KNEE ARTHROPLASTY Left 07/27/2020   Procedure: LEFT TOTAL KNEE ARTHROPLASTY;  Surgeon: Kathryne Hitch, MD;   Location: [facility];  Service: Orthopedics;  Laterality: Left;   Social History   Socioeconomic History   Marital status: Married    Spouse name: Not on file   Number of children: Not on file   Years of education: Not on file   Highest education level: Not on file  Occupational History   Not on file  Tobacco Use   Smoking status: Never   Smokeless tobacco: Never  Vaping Use   Vaping Use: Never used  Substance and Sexual Activity   Alcohol use: Never    Alcohol/week: 0.0 standard drinks   Drug use: Never   Sexual activity: Not on file  Other Topics Concern   Not on file  Social History Narrative   Right handed   Two story home   Drinks caffeine   Social Determinants of Health   Financial Resource Strain: Not on file  Food Insecurity: Not on file  Transportation Needs: Not on file  Physical Activity: Not on file  Stress: Not on file  Social Connections: Not on file   Allergies  Allergen Reactions   Gabapentin Nausea And Vomiting   Meloxicam    Penicillins Rash   Family History  Problem Relation Age of Onset   Heart disease Mother    Healthy Brother      Current Outpatient Medications (Cardiovascular):    losartan-hydrochlorothiazide (HYZAAR) 100-12.5 MG tablet, Take 1 tablet by mouth daily.   rosuvastatin (CRESTOR)  20 MG tablet, Take 20 mg by mouth daily.   Current Outpatient Medications (Analgesics):    Adalimumab (HUMIRA) 40 MG/0.4ML PSKT, Inject 40 mg into the skin every 14 (fourteen) days.    Current Outpatient Medications (Other):    bimatoprost (LUMIGAN) 0.03 % ophthalmic solution, Place 1 drop into both eyes at bedtime.    COMBIGAN 0.2-0.5 % ophthalmic solution, Place 1 drop into both eyes 2 (two) times daily.    Multiple Vitamin (MULTIVITAMIN PO), Take 1 tablet by mouth daily.    mycophenolate (CELLCEPT) 500 MG tablet, Take 1,500 mg by mouth 2 (two) times daily.    PENNSAID 2 % SOLN, APPLY 2 PUMPS TOPICALLY TO THE AFFECTED AREA(S) TWICE DAILY AS  DIRECTED   polyethylene glycol powder (GLYCOLAX/MIRALAX) 17 GM/SCOOP powder, Take 1 Container by mouth daily.    tacrolimus (PROGRAF) 1 MG capsule, Take 2 mg by mouth 2 (two) times daily.    terbinafine (LAMISIL) 250 MG tablet, Take 250 mg by mouth daily. (Patient not taking: Reported on 03/14/2021)     Objective  Blood pressure 124/72, pulse 99, height 5\' 3"  (1.6 m), weight 220 lb (99.8 kg), SpO2 94 %.   General: No apparent distress alert and oriented x3 mood and affect normal, dressed appropriately.  HEENT: Pupils equal, extraocular movements intact  Respiratory: Patient's speak in full sentences and does not appear short of breath  Cardiovascular: No lower extremity edema, non tender, no erythema hand exam shows the patient does not have any significant tenderness or swelling of the CMC joint.    Impression and Recommendations:     The above documentation has been reviewed and is accurate and complete , DO

## 2021-09-10 ENCOUNTER — Ambulatory Visit: Payer: 59 | Admitting: Family Medicine

## 2021-09-10 ENCOUNTER — Encounter: Payer: Self-pay | Admitting: Family Medicine

## 2021-09-10 ENCOUNTER — Other Ambulatory Visit: Payer: Self-pay

## 2021-09-10 ENCOUNTER — Ambulatory Visit: Payer: Self-pay

## 2021-09-10 VITALS — BP 124/72 | HR 99 | Ht 63.0 in | Wt 220.0 lb

## 2021-09-10 DIAGNOSIS — M1811 Unilateral primary osteoarthritis of first carpometacarpal joint, right hand: Secondary | ICD-10-CM

## 2021-09-10 NOTE — Assessment & Plan Note (Signed)
Significantly improved at this time.  Patient has good range of motion.  Will not make any significant changes.  We attempted to see if we can get possibly topical anti-inflammatories. Discussed with patient has had significant difficulty getting it from different pharmacies.  Patient will do her due diligence and see if she can find it somewhere else and we can prescribe it accordingly.  As long as patient does well can follow-up with [place] on an as-needed basis for this problem.

## 2021-09-10 NOTE — Patient Instructions (Signed)
Good to see you! Check [place] or [place] So glad nothing is new today See you again in 8 weeks

## 2021-11-05 NOTE — Progress Notes (Signed)
?Terrilee Files D.O. ?[facility] ?[address] [place] [address] ?Phone: [phone] ?Subjective:   ?I, Wilford Grist, am serving as a scribe for Dr. Antoine Primas. ? ?This visit occurred during the SARS-CoV-2 public health emergency.  Safety protocols were in place, including screening questions prior to the visit, additional usage of staff PPE, and extensive cleaning of exam room while observing appropriate contact time as indicated for disinfecting solutions.  ?I'm seeing this patient by the request  of:  [name], MD ? ?CC: Multiple complaints ? ?[id number]  ?09/10/2021 ?Significantly improved at this time.  Patient has good range of motion.  Will not make any significant changes.  We attempted to see if we can get possibly topical anti-inflammatories. ?Discussed with patient has had significant difficulty getting it from different pharmacies.  Patient will do her due diligence and see if she can find it somewhere else and we can prescribe it accordingly.  As long as patient does well can follow-up with [place] on an as-needed basis for this problem. ? ?Update 11/06/2021 ?Kevia Zaucha is a 66 y.o. female coming in with complaint of B side and L proximal hamstring pain. Pain over past week. Pain with lumbar extension. No longer doing PT.  ? ?Thumb is doing much better but notes a know over [facility] joint.  ? ?History of B hip replacement.  ? ? ? ?  ? ?Past Medical History:  ?Diagnosis Date  ? Arthritis   ? Hyperlipemia   ? Hypertension   ? ?Past Surgical History:  ?Procedure Laterality Date  ? arm surgery Left   ? plate in left arm  ? CYST EXCISION Left   ? knee   ? 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 TOENAIL EXCISION Left 12/2020  ? 2nd digit   ? TOTAL KNEE ARTHROPLASTY Left 07/27/2020  ? Procedure: LEFT TOTAL KNEE ARTHROPLASTY;  Surgeon: Kathryne Hitch, MD;  Location: [facility];  Service: Orthopedics;  Laterality: Left;  ? ?Social History  ? ?Socioeconomic  History  ? Marital status: Married  ?  Spouse name: Not on file  ? Number of children: Not on file  ? Years of education: Not on file  ? Highest education level: Not on file  ?Occupational History  ? Not on file  ?Tobacco Use  ? Smoking status: Never  ? Smokeless tobacco: Never  ?Vaping Use  ? Vaping Use: Never used  ?Substance and Sexual Activity  ? Alcohol use: Never  ?  Alcohol/week: 0.0 standard drinks  ? Drug use: Never  ? Sexual activity: Not on file  ?Other Topics Concern  ? Not on file  ?Social History Narrative  ? Right handed  ? Two story home  ? Drinks caffeine  ? ?Social Determinants of Health  ? ?Financial Resource Strain: Not on file  ?Food Insecurity: Not on file  ?Transportation Needs: Not on file  ?Physical Activity: Not on file  ?Stress: Not on file  ?Social Connections: Not on file  ? ?Allergies  ?Allergen Reactions  ? Gabapentin Nausea And Vomiting  ? Meloxicam   ? Penicillins Rash  ? ?Family History  ?Problem Relation Age of Onset  ? Heart disease Mother   ? Healthy Brother   ? ? ? ?Current Outpatient Medications (Cardiovascular):  ?  losartan-hydrochlorothiazide (HYZAAR) 100-12.5 MG tablet, Take 1 tablet by mouth daily. ?  rosuvastatin (CRESTOR) 20 MG tablet, Take 20 mg by mouth daily. ? ? ?Current Outpatient Medications (Analgesics):  ?  Adalimumab (HUMIRA) 40 MG/0.4ML PSKT, Inject 40 mg into the skin every 14 (fourteen) days.  ? ? ?Current Outpatient Medications (Other):  ?  bimatoprost (LUMIGAN) 0.03 % ophthalmic solution, Place 1 drop into both eyes at bedtime.  ?  COMBIGAN 0.2-0.5 % ophthalmic solution, Place 1 drop into both eyes 2 (two) times daily.  ?  Multiple Vitamin (MULTIVITAMIN PO), Take 1 tablet by mouth daily.  ?  mycophenolate (CELLCEPT) 500 MG tablet, Take 1,500 mg by mouth 2 (two) times daily.  ?  PENNSAID 2 % SOLN, APPLY 2 PUMPS TOPICALLY TO THE AFFECTED AREA(S) TWICE DAILY AS DIRECTED ?  polyethylene glycol powder (GLYCOLAX/MIRALAX) 17 GM/SCOOP powder, Take 1 Container by  mouth daily.  ?  tacrolimus (PROGRAF) 1 MG capsule, Take 2 mg by mouth 2 (two) times daily.  ?  terbinafine (LAMISIL) 250 MG tablet, Take 250 mg by mouth daily. ?  tiZANidine (ZANAFLEX) 2 MG tablet, Take 1 tablet (2 mg total) by mouth at bedtime. ? ? ?Reviewed prior external information including notes and imaging from  ?primary care provider ?As well as notes that were available from care everywhere and other healthcare systems. ? ?Past medical history, social, surgical and family history all reviewed in electronic medical record.  No pertanent information unless stated regarding to the chief complaint.  ? ?Review of Systems: ? No headache, visual changes, nausea, vomiting, diarrhea, constipation, dizziness, abdominal pain, skin rash, fevers, chills, night sweats, weight loss, swollen lymph nodes, chest pain, shortness of breath, mood changes. POSITIVE muscle aches, body aches, joint swelling ? ?Objective  ?Blood pressure 128/82, pulse 68, height 5\' 3"  (1.6 m), weight 231 lb (104.8 kg), SpO2 98 %. ?  ?General: No apparent distress alert and oriented x3 mood and affect normal, dressed appropriately.  ?HEENT: Pupils equal, extraocular movements intact  ?Respiratory: Patient's speak in full sentences and does not appear short of breath  ?Cardiovascular: No lower extremity edema, non tender, no erythema  ?Gait antalgic gait ?Back exam does have significant loss of lordosis.  Some tenderness to palpation of the paraspinal musculature. ?Patient does have positive straight leg test with radicular symptoms down the left leg in the L4 distribution. ?  ?Impression and Recommendations:  ?  ?The above documentation has been reviewed and is accurate and complete , DO ? ? ? ?

## 2021-11-06 ENCOUNTER — Ambulatory Visit: Payer: 59 | Admitting: Family Medicine

## 2021-11-06 ENCOUNTER — Other Ambulatory Visit: Payer: Self-pay

## 2021-11-06 ENCOUNTER — Ambulatory Visit: Payer: Self-pay

## 2021-11-06 VITALS — BP 128/82 | HR 68 | Ht 63.0 in | Wt 231.0 lb

## 2021-11-06 DIAGNOSIS — M5416 Radiculopathy, lumbar region: Secondary | ICD-10-CM

## 2021-11-06 DIAGNOSIS — M79644 Pain in right finger(s): Secondary | ICD-10-CM

## 2021-11-06 MED ORDER — TIZANIDINE HCL 2 MG PO TABS: 2.0000 mg | ORAL_TABLET | Freq: Every day | ORAL | 0 refills | Status: DC

## 2021-11-06 NOTE — Assessment & Plan Note (Addendum)
Chronic problem with exacerbation.  Is having more radicular symptoms at this time.  Discussed icing regimen and home exercises, discussed avoiding certain activities.  Patient responded well to an epidural in October.  Did discuss with patient to ask ophthalmologist if they are okay with a epidural steroid injection which I think they will be.  Patient will increase activity only after the injection.  Follow-up again in 6 to 8 weeks ?

## 2021-11-06 NOTE — Patient Instructions (Addendum)
Epidural [id number] ?Check with eye doc if they want to recheck pressure after injection ?Zanaflex 2mg  at night ?See me again in 4 weeks after epidural ?

## 2021-11-15 ENCOUNTER — Other Ambulatory Visit: Payer: 59

## 2021-11-28 ENCOUNTER — Ambulatory Visit
Admission: RE | Admit: 2021-11-28 | Discharge: 2021-11-28 | Disposition: A | Discharge status: Home / Self Care | Payer: 59 | Source: Ambulatory Visit | Attending: Family Medicine | Admitting: Family Medicine

## 2021-11-28 DIAGNOSIS — M5416 Radiculopathy, lumbar region: Secondary | ICD-10-CM

## 2021-11-28 IMAGING — XA Imaging study
2 series · 2 of 2 positions shown · non-contrast
Comparison: none

CLINICAL DATA: Lumbosacral spondylosis without myelopathy. Low
back, left buttock, and left posterolateral thigh pain. No
improvement after L3-L4 interlaminar epidural injection last
[REDACTED].

[Series 1: ortho standard · 1 of 1 slices shown (1 of 2)]
[im 1/1]
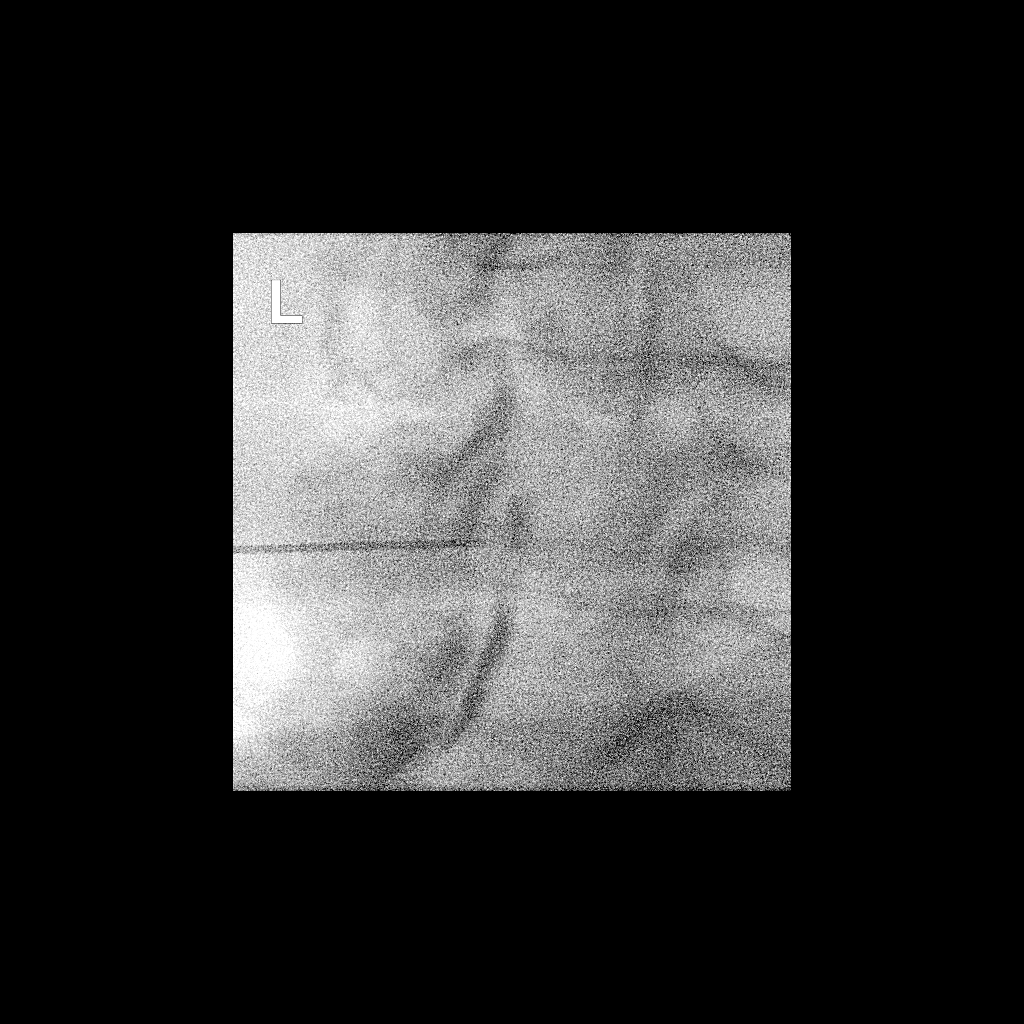

[Series 2: ortho standard · 1 of 1 slices shown (2 of 2)]
[im 1/1]
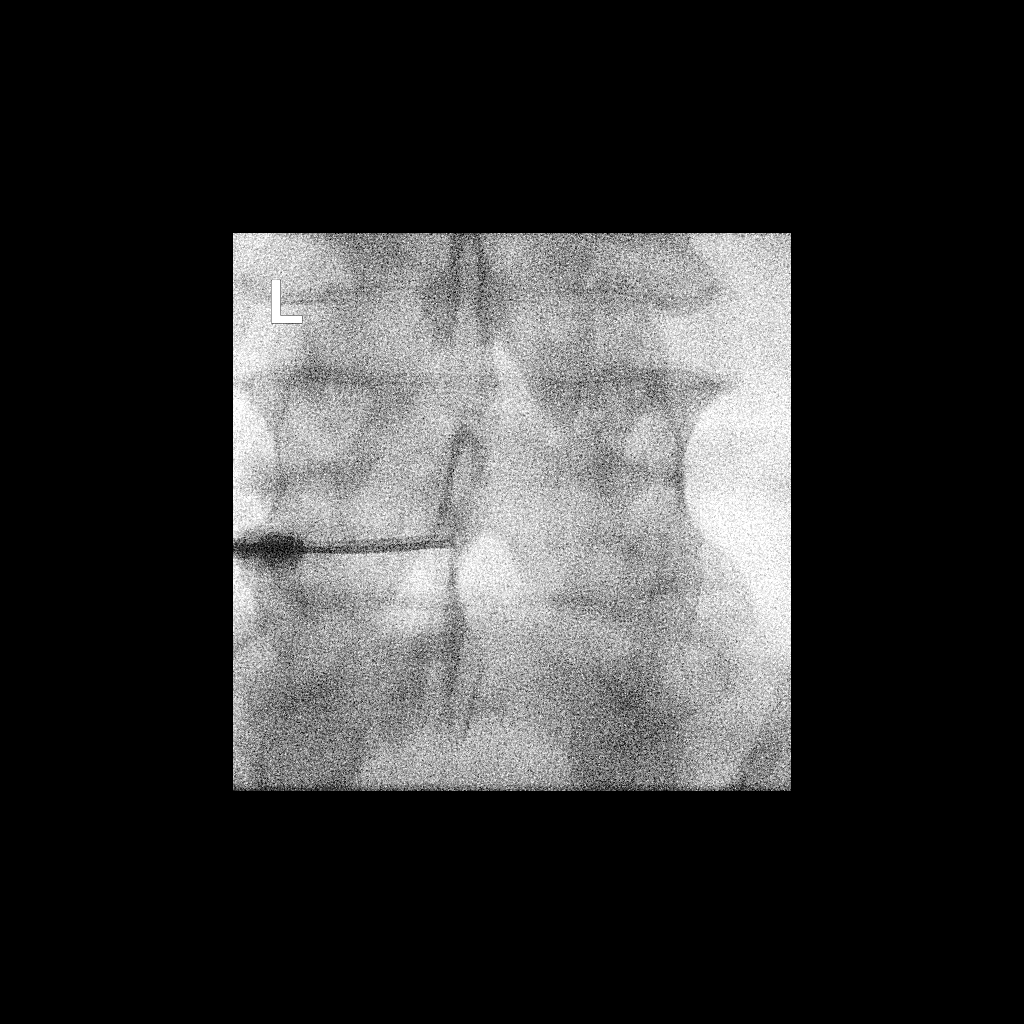

[2 of 2 positions shown; findings below may reference images not displayed]

FLUOROSCOPY:
Radiation Exposure Index (as provided by the fluoroscopic device):
2.0 mGy Kerma

PROCEDURE:
The procedure, risks, benefits, and alternatives were explained to
the patient. Questions regarding the procedure were encouraged and
answered. The patient understands and consents to the procedure.

LUMBAR EPIDURAL INJECTION:

An interlaminar approach was performed on the left at L4-L5. The
overlying skin was cleansed and anesthetized. A 3.5 inch 20 gauge
epidural needle was advanced using loss-of-resistance technique.

DIAGNOSTIC EPIDURAL INJECTION:

Injection of Isovue-M 200 shows a good epidural pattern with spread
above and below the level of needle placement, primarily on the
left. No vascular opacification is seen.

THERAPEUTIC EPIDURAL INJECTION:

80 mg of Depo-Medrol mixed with 3 mL of 1% lidocaine were instilled.
The procedure was well-tolerated, and the patient was discharged
thirty minutes following the injection in good condition.

COMPLICATIONS:
None immediate.
IMPRESSION: Technically successful interlaminar epidural injection on the left
at L4-L5.

## 2021-11-28 MED ORDER — METHYLPREDNISOLONE ACETATE 40 MG/ML INJ SUSP (RADIOLOG
80.0000 mg | Freq: Once | INTRAMUSCULAR | Status: AC
Start: 2021-11-28 — End: 2021-11-28
  Administered 2021-11-28: 80 mg via EPIDURAL

## 2021-11-28 MED ORDER — IOPAMIDOL (ISOVUE-M 200) INJECTION 41%
1.0000 mL | Freq: Once | INTRAMUSCULAR | Status: AC
  Administered 2021-11-28: 1 mL via EPIDURAL

## 2021-11-28 NOTE — Discharge Instructions (Signed)

## 2021-11-29 ENCOUNTER — Other Ambulatory Visit: Payer: Self-pay | Admitting: Family Medicine

## 2021-12-19 NOTE — Progress Notes (Signed)
?Olivia Barnes D.O. ?[facility] ?[address] [place] [address] ?Phone: [phone] ?Subjective:   ?I, Olivia Barnes, am serving as a Neurosurgeon for Dr. Antoine Barnes. ?This visit occurred during the SARS-CoV-2 public health emergency.  Safety protocols were in place, including screening questions prior to the visit, additional usage of staff PPE, and extensive cleaning of exam room while observing appropriate contact time as indicated for disinfecting solutions.  ? ?I'm seeing this patient by the request  of:  Olivia Moron, MD ? ?CC: Low back pain follow-up ? ?[id number]  ?11/06/2021 ?Chronic problem with exacerbation.  Is having more radicular symptoms at this time.  Discussed icing regimen and home exercises, discussed avoiding certain activities.  Patient responded well to an epidural in October.  Did discuss with patient to ask ophthalmologist if they are okay with a epidural steroid injection which I think they will be.  Patient will increase activity only after the injection.  Follow-up again in 6 to 8 weeks ? ?Update 12/31/2021 ?Olivia Barnes is a 66 y.o. female coming in with complaint of lumbar spine pain. Epidural 11/28/2021. Patient states better than the first one but left side still hurting.  Patient is feeling much better overall when it comes to her back approximately does have more discomfort noted over the hip.  Having difficulty raising her hip.  Does not remember any true injury.  Left-sided only. ? ? ? ?  ? ?Past Medical History:  ?Diagnosis Date  ? Arthritis   ? Hyperlipemia   ? Hypertension   ? ?Past Surgical History:  ?Procedure Laterality Date  ? arm surgery Left   ? plate in left arm  ? CYST EXCISION Left   ? knee   ? 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 TOENAIL EXCISION Left 12/2020  ? 2nd digit   ? TOTAL KNEE ARTHROPLASTY Left 07/27/2020  ? Procedure: LEFT TOTAL KNEE ARTHROPLASTY;  Surgeon: Olivia Hitch, MD;  Location: [facility];  Service:  Orthopedics;  Laterality: Left;  ? ?Social History  ? ?Socioeconomic History  ? Marital status: Married  ?  Spouse name: Not on file  ? Number of children: Not on file  ? Years of education: Not on file  ? Highest education level: Not on file  ?Occupational History  ? Not on file  ?Tobacco Use  ? Smoking status: Never  ? Smokeless tobacco: Never  ?Vaping Use  ? Vaping Use: Never used  ?Substance and Sexual Activity  ? Alcohol use: Never  ?  Alcohol/week: 0.0 standard drinks  ? Drug use: Never  ? Sexual activity: Not on file  ?Other Topics Concern  ? Not on file  ?Social History Narrative  ? Right handed  ? Two story home  ? Drinks caffeine  ? ?Social Determinants of Health  ? ?Financial Resource Strain: Not on file  ?Food Insecurity: Not on file  ?Transportation Needs: Not on file  ?Physical Activity: Not on file  ?Stress: Not on file  ?Social Connections: Not on file  ? ?Allergies  ?Allergen Reactions  ? Gabapentin Nausea And Vomiting  ? Meloxicam   ? Penicillins Rash  ? ?Family History  ?Problem Relation Age of Onset  ? Heart disease Mother   ? Healthy Brother   ? ? ? ?Current Outpatient Medications (Cardiovascular):  ?  losartan-hydrochlorothiazide (HYZAAR) 100-12.5 MG tablet, Take 1 tablet by mouth daily. ?  rosuvastatin (CRESTOR) 20 MG tablet, Take 20 mg by mouth daily. ? ? ?  Current Outpatient Medications (Analgesics):  ?  Adalimumab (HUMIRA) 40 MG/0.4ML PSKT, Inject 40 mg into the skin every 14 (fourteen) days.  ? ? ?Current Outpatient Medications (Other):  ?  bimatoprost (LUMIGAN) 0.03 % ophthalmic solution, Place 1 drop into both eyes at bedtime.  ?  COMBIGAN 0.2-0.5 % ophthalmic solution, Place 1 drop into both eyes 2 (two) times daily.  ?  Multiple Vitamin (MULTIVITAMIN PO), Take 1 tablet by mouth daily.  ?  mycophenolate (CELLCEPT) 500 MG tablet, Take 1,500 mg by mouth 2 (two) times daily.  ?  PENNSAID 2 % SOLN, APPLY 2 PUMPS TOPICALLY TO THE AFFECTED AREA(S) TWICE DAILY AS DIRECTED ?  polyethylene glycol  powder (GLYCOLAX/MIRALAX) 17 GM/SCOOP powder, Take 1 Container by mouth daily.  ?  tacrolimus (PROGRAF) 1 MG capsule, Take 2 mg by mouth 2 (two) times daily.  ?  terbinafine (LAMISIL) 250 MG tablet, Take 250 mg by mouth daily. ?  tiZANidine (ZANAFLEX) 2 MG tablet, TAKE 1 TABLET BY MOUTH EVERYDAY AT BEDTIME ? ? ?Reviewed prior external information including notes and imaging from  ?primary care provider ?As well as notes that were available from care everywhere and other healthcare systems. ? ?Past medical history, social, surgical and family history all reviewed in electronic medical record.  No pertanent information unless stated regarding to the chief complaint.  ? ?Review of Systems: ? No headache, visual changes, nausea, vomiting, diarrhea, constipation, dizziness, abdominal pain, skin rash, fevers, chills, night sweats, weight loss, swollen lymph nodes, body aches, joint swelling, chest pain, shortness of breath, mood changes. POSITIVE muscle aches ? ?Objective  ?Blood pressure (!) 152/92, pulse 72, height 5\' 3"  (1.6 m), weight 216 lb (98 kg), SpO2 98 %. ?  ?General: No apparent distress alert and oriented x3 mood and affect normal, dressed appropriately.  ?HEENT: Pupils equal, extraocular movements intact  ?Respiratory: Patient's speak in full sentences and does not appear short of breath  ?Cardiovascular: No lower extremity edema, non tender, no erythema  ?Gait antalgic gait noted. ?Left hip exam does have discomfort with the quadricep.  Patient has difficulty with raising the hip secondary to discomfort in the leg itself.  Negative fulcrum test though noted. ? ?97110; 15 additional minutes spent for Therapeutic exercises as stated in above notes.  This included exercises focusing on stretching, strengthening, with significant focus on eccentric aspects.   Long term goals include an improvement in range of motion, strength, endurance as well as avoiding reinjury. Patient's frequency would include in 1-2 times  a day, 3-5 times a week for a duration of 6-12 weeks. Hip strengthening exercises which included:  ?Pelvic tilt/bracing to help with proper recruitment of the lower abs and pelvic floor muscles  ?Glute strengthening to properly contract glutes without over-engaging low back and hamstrings - prone hip extension and glute bridge exercises ?Proper stretching techniques to increase effectiveness for the hip flexors, groin, quads, piriformic and low back when appropriate  ?  Proper technique shown and discussed handout in great detail with ATC.  All questions were discussed and answered.  ? ? ?  ?Impression and Recommendations:  ?  ? ?The above documentation has been reviewed and is accurate and complete , DO ? ? ? ?

## 2021-12-24 ENCOUNTER — Other Ambulatory Visit: Payer: Self-pay | Admitting: Family Medicine

## 2021-12-31 ENCOUNTER — Ambulatory Visit: Payer: 59 | Admitting: Family Medicine

## 2021-12-31 DIAGNOSIS — M5416 Radiculopathy, lumbar region: Secondary | ICD-10-CM

## 2021-12-31 DIAGNOSIS — Z96643 Presence of artificial hip joint, bilateral: Secondary | ICD-10-CM

## 2021-12-31 DIAGNOSIS — M25552 Pain in left hip: Secondary | ICD-10-CM | POA: Diagnosis not present

## 2021-12-31 DIAGNOSIS — M25551 Pain in right hip: Secondary | ICD-10-CM

## 2021-12-31 DIAGNOSIS — G8928 Other chronic postprocedural pain: Secondary | ICD-10-CM

## 2021-12-31 NOTE — Patient Instructions (Signed)
Do prescribed exercises at least 3x a week ?Icing 20 mins 4-6 hours ?Thigh compression sleeve with walking even in [place] ?See you again in 4-6 weeks ?

## 2021-12-31 NOTE — Assessment & Plan Note (Signed)
We discussed with patient again.  Seems to be more ago in the groin area, no significant instability.  Seems to be more muscular in nature.  Does not remember any trauma or injury.  Discussed compression icing regimen.  Once again have had a bone scan that did not show any significant loosening.  We will continue to monitor.  Patient will be traveling f/u 4-6 weeks  ?

## 2021-12-31 NOTE — Assessment & Plan Note (Signed)
Patient actually did not make significant improvement after the injections at this time.  Patient is just is having symptoms of more leg pain.  Seems to be completely different than what she has had some mild weakness in the hip flexor.  Does have a hip replacement on the left side but has had a bone scan done previously that was unremarkable.  No recent falls to make [place] think of any other significant instability.  Patient does contract compression sleeve and given exercises today.  Follow-up again in 6 to 8 weeks ?

## 2022-02-07 NOTE — Progress Notes (Unsigned)
[name] [address] [place] [address] Phone: [phone] Subjective:   [name], am serving as a scribe for Dr. Antoine Primas.  I'm seeing this patient by the request  of:  [name], MD  CC: Left shoulder   [id number]  12/31/2021 We discussed with patient again.  Seems to be more ago in the groin area, no significant instability.  Seems to be more muscular in nature.  Does not remember any trauma or injury.  Discussed compression icing regimen.  Once again have had a bone scan that did not show any significant loosening.  We will continue to monitor.  Patient will be traveling f/u 4-6 weeks   Update 02/11/2022 Olivia Barnes is a 66 y.o. female coming in with complaint of L shoulder, B hip and LBP. Patient states that she is having increase in pain in lumbar spine and L shoulder over past week. Using Ibu and pennsaid.       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    TOENAIL EXCISION Left 12/2020   2nd digit    TOTAL KNEE ARTHROPLASTY Left 07/27/2020   Procedure: LEFT TOTAL KNEE ARTHROPLASTY;  Surgeon: Kathryne Hitch, MD;  Location: [facility];  Service: Orthopedics;  Laterality: Left;   Social History   Socioeconomic History   Marital status: Married    Spouse name: Not on file   Number of children: Not on file   Years of education: Not on file   Highest education level: Not on file  Occupational History   Not on file  Tobacco Use   Smoking status: Never   Smokeless tobacco: Never  Vaping Use   Vaping Use: Never used  Substance and Sexual Activity   Alcohol use: Never    Alcohol/week: 0.0 standard drinks of alcohol   Drug use: Never   Sexual activity: Not on file  Other Topics  Concern   Not on file  Social History Narrative   Right handed   Two story home   Drinks caffeine   Social Determinants of Health   Financial Resource Strain: Not on file  Food Insecurity: Not on file  Transportation Needs: Not on file  Physical Activity: Not on file  Stress: Not on file  Social Connections: Not on file   Allergies  Allergen Reactions   Gabapentin Nausea And Vomiting   Meloxicam    Penicillins Rash   Family History  Problem Relation Age of Onset   Heart disease Mother    Healthy Brother      Current Outpatient Medications (Cardiovascular):    losartan-hydrochlorothiazide (HYZAAR) 100-12.5 MG tablet, Take 1 tablet by mouth daily.   rosuvastatin (CRESTOR) 20 MG tablet, Take 20 mg by mouth daily.   Current Outpatient Medications (Analgesics):    Adalimumab (HUMIRA) 40 MG/0.4ML PSKT, Inject 40 mg into the skin every 14 (fourteen) days.    Current Outpatient Medications (Other):    bimatoprost (LUMIGAN) 0.03 % ophthalmic solution, Place 1 drop into both eyes at bedtime.    COMBIGAN 0.2-0.5 % ophthalmic solution, Place 1 drop into both eyes 2 (two) times daily.    Multiple Vitamin (MULTIVITAMIN PO), Take 1 tablet  by mouth daily.    mycophenolate (CELLCEPT) 500 MG tablet, Take 1,500 mg by mouth 2 (two) times daily.    PENNSAID 2 % SOLN, APPLY 2 PUMPS TOPICALLY TO THE AFFECTED AREA(S) TWICE DAILY AS DIRECTED   polyethylene glycol powder (GLYCOLAX/MIRALAX) 17 GM/SCOOP powder, Take 1 Container by mouth daily.    tacrolimus (PROGRAF) 1 MG capsule, Take 2 mg by mouth 2 (two) times daily.    terbinafine (LAMISIL) 250 MG tablet, Take 250 mg by mouth daily.   tiZANidine (ZANAFLEX) 2 MG tablet, TAKE 1 TABLET BY MOUTH EVERYDAY AT BEDTIME   Reviewed prior external information including notes and imaging from  primary care provider As well as notes that were available from care everywhere and other healthcare systems.  Past medical history, social, surgical and  family history all reviewed in electronic medical record.  No pertanent information unless stated regarding to the chief complaint.   Review of Systems:  No headache, visual changes, nausea, vomiting, diarrhea, constipation, dizziness, abdominal pain, skin rash, fevers, chills, night sweats, weight loss, swollen lymph nodes, body aches, joint swelling, chest pain, shortness of breath, mood changes. POSITIVE muscle aches  Objective  Blood pressure 110/86, pulse 64, height 5\' 3"  (1.6 m), weight 221 lb (100.2 kg), SpO2 97 %.   General: No apparent distress alert and oriented x3 mood and affect normal, dressed appropriately.  HEENT: Pupils equal, extraocular movements intact  Respiratory: Patient's speak in full sentences and does not appear short of breath  Cardiovascular: No lower extremity edema, non tender, no erythema  Patient does have some loss of lordosis.  Tenderness to palpation of the paraspinal musculature.  Patient does have limited extension of the back.  Tightness with straight leg test.  Shoulder: left Inspection reveals no abnormalities, atrophy or asymmetry. Palpation is normal with no tenderness over AC joint or bicipital groove. ROM is full in all planes passively. Rotator cuff strength normal throughout. signs of impingement with positive Neer and Hawkin's tests, but negative empty can sign. Speeds and Yergason's tests normal. No labral pathology noted with negative Obrien's, negative clunk and good stability. Normal scapular function observed. No painful arc and no drop arm sign. No apprehension sign  MSK performed of: left This study was ordered, performed, and interpreted by [place] D.O.  Shoulder:   Supraspinatus:  Appears normal on long and transverse views, Bursal bulge seen with shoulder abduction on impingement view. Infraspinatus:  Appears normal on long and transverse views. Significant increase in Doppler flow Subscapularis:  Appears normal on long  and transverse views. Positive bursa Teres Minor:  Appears normal on long and transverse views. AC joint:  Capsule undistended, no geyser sign. Glenohumeral Joint:  Appears normal without effusion. Glenoid Labrum:  Intact without visualized tears. Biceps Tendon:  Appears normal on long and transverse views, no fraying of tendon, tendon located in intertubercular groove, no subluxation with shoulder internal or external rotation.  Impression: Subacromial bursitis  Procedure: Real-time Ultrasound Guided Injection of left acromioclavicular joint joint Device: GE Logiq E  Ultrasound guided injection is preferred based studies that show increased duration, increased effect, greater accuracy, decreased procedural pain, increased response rate with ultrasound guided versus blind injection.  Verbal informed consent obtained.  Time-out conducted.  Noted no overlying erythema, induration, or other signs of local infection.  Skin prepped in a sterile fashion.  Local anesthesia: Topical Ethyl chloride.  With sterile technique and under real time ultrasound guidance:  Joint visualized.  23g 1  inch needle  inserted superior approach.  The patient was injected with 0.5 cc of 0.5% Marcaine and 0.5 cc of Kenalog 40 mg/mL Pain immediately resolved suggesting accurate placement of the medication.  Advised to call if fevers/chills, erythema, induration, drainage, or persistent bleeding.  Impression: Technically successful ultrasound guided injection.  Procedure: Real-time Ultrasound Guided Injection of left glenohumeral joint Device: GE Logiq E  Ultrasound guided injection is preferred based studies that show increased duration, increased effect, greater accuracy, decreased procedural pain, increased response rate with ultrasound guided versus blind injection.  Verbal informed consent obtained.  Time-out conducted.  Noted no overlying erythema, induration, or other signs of local infection.  Skin prepped in  a sterile fashion.  Local anesthesia: Topical Ethyl chloride.  With sterile technique and under real time ultrasound guidance:  Joint visualized.  21g 2 inch needle inserted posterior approach. Pictures taken for needle placement. Patient did have injection of 2 cc of 0.5% Marcaine, and 1cc of Kenalog 40 mg/dL. Completed without difficulty  Pain immediately resolved suggesting accurate placement of the medication.  Advised to call if fevers/chills, erythema, induration, drainage, or persistent bleeding.  Images permanently stored and available for review in the ultrasound unit.  Impression: Technically successful ultrasound guided injection.    Impression and Recommendations:

## 2022-02-11 ENCOUNTER — Ambulatory Visit: Payer: 59 | Admitting: Family Medicine

## 2022-02-11 ENCOUNTER — Ambulatory Visit: Payer: Self-pay

## 2022-02-11 ENCOUNTER — Encounter: Payer: Self-pay | Admitting: Family Medicine

## 2022-02-11 VITALS — BP 110/86 | HR 64 | Ht 63.0 in | Wt 221.0 lb

## 2022-02-11 DIAGNOSIS — M25512 Pain in left shoulder: Secondary | ICD-10-CM | POA: Diagnosis not present

## 2022-02-11 DIAGNOSIS — G8929 Other chronic pain: Secondary | ICD-10-CM

## 2022-02-11 DIAGNOSIS — M5416 Radiculopathy, lumbar region: Secondary | ICD-10-CM | POA: Diagnosis not present

## 2022-02-11 DIAGNOSIS — M7552 Bursitis of left shoulder: Secondary | ICD-10-CM

## 2022-02-11 DIAGNOSIS — M19011 Primary osteoarthritis, right shoulder: Secondary | ICD-10-CM

## 2022-02-11 NOTE — Assessment & Plan Note (Signed)
Patient given injection today.  Home exercises given.  New problem at this time.  We will continue similar presentation on the contralateral side previously.  Discussed icing regimen and home exercises, which activities to do and which ones to avoid.  Follow-up with me again in 2 months if worsening pain consider formal physical therapy.  Does have some degenerative changes of the rotator cuff we will need to monitor.

## 2022-02-11 NOTE — Assessment & Plan Note (Signed)
Has responded relatively well to the injection previously on the contralateral side and hopefully that this will be helpful.  Could be the underlying autoimmune that could be contributing as well.  Follow-up with me again in 6 to 8 weeks otherwise.

## 2022-02-11 NOTE — Assessment & Plan Note (Signed)
Treatment discussed degenerative disc disease of the lumbar spine with multiple areas of nerve root impingement.  Did respond extremely well to the injection 2 months ago and has slowly worsened again.  The patient feels like this is very similar to how this felt before injections.  At this point we will have patient get a repeat epidural and hopefully will have some improvement.  We will follow-up with him in 4 to 6 weeks after the epidural.

## 2022-02-11 NOTE — Patient Instructions (Addendum)
Epidural [id number] Exercises for shoulder See me in 2-3 months

## 2022-03-11 ENCOUNTER — Ambulatory Visit: Payer: 59 | Admitting: Family Medicine

## 2022-03-12 NOTE — Progress Notes (Signed)
O[name] [address] [place] [address] Phone: [phone]   Assessment and Plan:     1. Chronic right shoulder pain -Chronic with exacerbation, subsequent sports medicine visit - Recurrence of right shoulder pain over the past 10 days without significant MOI, most consistent with rotator cuff pathology without specific weakness - Patient elected for subacromial CSI.  Tolerated well per note below - Start HEP for rotator cuff  Procedure: Subacromial Injection Side: Right  Risks explained and consent was given verbally. The site was cleaned with alcohol prep. A steroid injection was performed from posterior approach using 42mL of 1% lidocaine without epinephrine and 53mL of kenalog 40mg /ml. This was well tolerated and resulted in symptomatic relief.  Needle was removed, hemostasis achieved, and post injection instructions were explained.   Pt was advised to call or return to clinic if these symptoms worsen or fail to improve as anticipated.    Pertinent previous records reviewed include right shoulder x-ray 2022, primary care office visit notes with new right shoulder x-ray report from 02/2022   Follow Up: 4 weeks for reevaluation.  Could consider intra-articular injection if subacromial CSI was not sufficient.  Could consider physical therapy versus ultrasound   Subjective:   I, Olivia Barnes, am serving as a 03/2022 for Doctor Neurosurgeon  Chief Complaint: right shoulder pain   HPI:   03/13/2022 Patient is a 66 year old female complaining of right shoulder pain . Patient states that her right arm hurts, started last Thursday , no MOI , was rx pain meds but they aren't working , no numbness or tingling, is able to move her arm but it is painful , no locking clicking or popping, states her arm has given out on her once,   Relevant Historical Information: Bilateral shoulder arthritis, hypertension  Additional pertinent  review of systems negative.   Current Outpatient Medications:    Adalimumab (HUMIRA) 40 MG/0.4ML PSKT, Inject 40 mg into the skin every 14 (fourteen) days. , Disp: , Rfl:    bimatoprost (LUMIGAN) 0.03 % ophthalmic solution, Place 1 drop into both eyes at bedtime. , Disp: , Rfl:    COMBIGAN 0.2-0.5 % ophthalmic solution, Place 1 drop into both eyes 2 (two) times daily. , Disp: , Rfl:    losartan-hydrochlorothiazide (HYZAAR) 100-12.5 MG tablet, Take 1 tablet by mouth daily., Disp: , Rfl:    Multiple Vitamin (MULTIVITAMIN PO), Take 1 tablet by mouth daily. , Disp: , Rfl:    mycophenolate (CELLCEPT) 500 MG tablet, Take 1,500 mg by mouth 2 (two) times daily. , Disp: , Rfl:    PENNSAID 2 % SOLN, APPLY 2 PUMPS TOPICALLY TO THE AFFECTED AREA(S) TWICE DAILY AS DIRECTED, Disp: 112 g, Rfl: 3   polyethylene glycol powder (GLYCOLAX/MIRALAX) 17 GM/SCOOP powder, Take 1 Container by mouth daily. , Disp: , Rfl:    rosuvastatin (CRESTOR) 20 MG tablet, Take 20 mg by mouth daily., Disp: , Rfl:    tacrolimus (PROGRAF) 1 MG capsule, Take 2 mg by mouth 2 (two) times daily. , Disp: , Rfl:    terbinafine (LAMISIL) 250 MG tablet, Take 250 mg by mouth daily., Disp: , Rfl:    tiZANidine (ZANAFLEX) 2 MG tablet, TAKE 1 TABLET BY MOUTH EVERYDAY AT BEDTIME, Disp: 30 tablet, Rfl: 0   Objective:     Vitals:   03/13/22 1555  BP: 140/86  Pulse: 70  SpO2: 98%  Weight: 219 lb (99.3 kg)  Height: 5\' 3"  (  1.6 m)      Body mass index is 38.79 kg/m.    Physical Exam:    Gen: Appears well, nad, nontoxic and pleasant Neuro:sensation intact, strength is 5/5 with df/pf/inv/ev, muscle tone wnl Skin: no suspicious lesion or defmority Psych: A&O, appropriate mood and affect  Right shoulder: no deformity, swelling or muscle wasting No scapular winging FF 80, abd 70, int 30, ext 70 TTP anterior shoulder musculature and deltoid and trapezius NTTP over the , clavicle, ac, coracoid, biceps groove, humerus,  cervical  spine Unable to properly perform special testing due to significantly limited range of motion and discomfort in clinic Neg ant drawer, sulcus sign, apprehension Negative Spurling's test bilat FROM of neck    Electronically signed by:  Olivia [name] 4:18 PM 03/13/22

## 2022-03-13 ENCOUNTER — Ambulatory Visit: Payer: 59 | Admitting: Sports Medicine

## 2022-03-13 VITALS — BP 140/86 | HR 70 | Ht 63.0 in | Wt 219.0 lb

## 2022-03-13 DIAGNOSIS — G8929 Other chronic pain: Secondary | ICD-10-CM

## 2022-03-13 DIAGNOSIS — M25511 Pain in right shoulder: Secondary | ICD-10-CM

## 2022-03-13 NOTE — Patient Instructions (Addendum)
Good to see you  Shoulder HEP  4 week follow up  

## 2022-03-21 ENCOUNTER — Ambulatory Visit
Admission: RE | Admit: 2022-03-21 | Discharge: 2022-03-21 | Disposition: A | Discharge status: Home / Self Care | Payer: 59 | Source: Ambulatory Visit | Attending: Family Medicine | Admitting: Family Medicine

## 2022-03-21 DIAGNOSIS — G8929 Other chronic pain: Secondary | ICD-10-CM

## 2022-03-21 MED ORDER — METHYLPREDNISOLONE ACETATE 40 MG/ML INJ SUSP (RADIOLOG
80.0000 mg | Freq: Once | INTRAMUSCULAR | Status: AC
  Administered 2022-03-21: 80 mg via EPIDURAL

## 2022-03-21 MED ORDER — IOPAMIDOL (ISOVUE-M 200) INJECTION 41%
1.0000 mL | Freq: Once | INTRAMUSCULAR | Status: AC
  Administered 2022-03-21: 1 mL via EPIDURAL

## 2022-03-21 NOTE — Discharge Instructions (Signed)

## 2022-04-08 NOTE — Progress Notes (Unsigned)
[name] [address] [place] [address] Phone: [phone] Subjective:   Olivia Barnes, am serving as a scribe for Dr. Antoine Primas.  I'm seeing this patient by the request  of:  [name], MD  CC: Low back, shoulder pain follow-up  ALP:[id number]  02/11/2022 Has responded relatively well to the injection previously on the contralateral side and hopefully that this will be helpful.  Could be the underlying autoimmune that could be contributing as well.  Follow-up with me again in 6 to 8 weeks otherwise.  Treatment discussed degenerative disc disease of the lumbar spine with multiple areas of nerve root impingement.  Did respond extremely well to the injection 2 months ago and has slowly worsened again.  The patient feels like this is very similar to how this felt before injections.  At this point we will have patient get a repeat epidural and hopefully will have some improvement.  We will follow-up with him in 4 to 6 weeks after the epidural.  Patient given injection today.  Home exercises given.  New problem at this time.   We will continue similar presentation on the contralateral side previously.  Discussed icing regimen and home exercises, which activities to do and which ones to avoid.  Follow-up with me again in 2 months if worsening pain consider formal physical therapy.  Does have some degenerative changes of the rotator cuff we will need to monitor.  Update 04/10/2022 Olivia Barnes is a 66 y.o. female coming in with complaint of R shoulder pain and LBP. Epidural 03/21/2022. Last visit was for L shoudler pain but patient saw Dr. Jean Rosenthal for R shoulder on 03/13/2022. Patient states shoulder pain is still there, but not as bad. Feels like injection helped a little. Epidural did well. No pain at the moment.  Past medical history still significant for inflammatory arthropathies.     Past Medical History:  Diagnosis Date   Arthritis     Hyperlipemia    Hypertension    Past Surgical History:  Procedure Laterality Date   arm surgery Left    plate in left arm   CYST EXCISION Left    knee    REPLACEMENT TOTAL HIP W/  RESURFACING IMPLANTS Bilateral    TOENAIL EXCISION Left 12/2020   2nd digit    TOTAL KNEE ARTHROPLASTY Left 07/27/2020   Procedure: LEFT TOTAL KNEE ARTHROPLASTY;  Surgeon: Kathryne Hitch, MD;  Location: [facility];  Service: Orthopedics;  Laterality: Left;   Social History   Socioeconomic History   Marital status: Married    Spouse name: Not on file   Number of children: Not on file   Years of education: Not on file   Highest education level: Not on file  Occupational History   Not on file  Tobacco Use   Smoking status: Never   Smokeless tobacco: Never  Vaping Use   Vaping Use: Never used  Substance and Sexual Activity   Alcohol use: Never    Alcohol/week: 0.0 standard drinks of alcohol   Drug use: Never   Sexual activity: Not on file  Other Topics Concern   Not on file  Social History Narrative   Right handed   Two story home   Drinks caffeine   Social Determinants of Health   Financial Resource Strain: Not on file  Food Insecurity: Not on file  Transportation Needs: Not on file  Physical Activity: Not on file  Stress: Not on file  Social  Connections: Not on file   Allergies  Allergen Reactions   Gabapentin Nausea And Vomiting   Meloxicam    Penicillins Rash   Family History  Problem Relation Age of Onset   Heart disease Mother    Healthy Brother      Current Outpatient Medications (Cardiovascular):    losartan-hydrochlorothiazide (HYZAAR) 100-12.5 MG tablet, Take 1 tablet by mouth daily.   rosuvastatin (CRESTOR) 20 MG tablet, Take 20 mg by mouth daily.   Current Outpatient Medications (Analgesics):    Adalimumab (HUMIRA) 40 MG/0.4ML PSKT, Inject 40 mg into the skin every 14 (fourteen) days.    Current Outpatient Medications (Other):    bimatoprost (LUMIGAN)  0.03 % ophthalmic solution, Place 1 drop into both eyes at bedtime.    COMBIGAN 0.2-0.5 % ophthalmic solution, Place 1 drop into both eyes 2 (two) times daily.    Multiple Vitamin (MULTIVITAMIN PO), Take 1 tablet by mouth daily.    mycophenolate (CELLCEPT) 500 MG tablet, Take 1,500 mg by mouth 2 (two) times daily.    PENNSAID 2 % SOLN, APPLY 2 PUMPS TOPICALLY TO THE AFFECTED AREA(S) TWICE DAILY AS DIRECTED   polyethylene glycol powder (GLYCOLAX/MIRALAX) 17 GM/SCOOP powder, Take 1 Container by mouth daily.    tacrolimus (PROGRAF) 1 MG capsule, Take 2 mg by mouth 2 (two) times daily.    terbinafine (LAMISIL) 250 MG tablet, Take 250 mg by mouth daily.   tiZANidine (ZANAFLEX) 2 MG tablet, TAKE 1 TABLET BY MOUTH EVERYDAY AT BEDTIME   Reviewed prior external information including notes and imaging from  primary care provider As well as notes that were available from care everywhere and other healthcare systems.  Past medical history, social, surgical and family history all reviewed in electronic medical record.  No pertanent information unless stated regarding to the chief complaint.   Review of Systems:  No headache, visual changes, nausea, vomiting, diarrhea, constipation, dizziness, abdominal pain, skin rash, fevers, chills, night sweats, weight loss, swollen lymph nodes, body aches, joint swelling, chest pain, shortness of breath, mood changes. POSITIVE muscle aches  Objective  Blood pressure 136/76, pulse 72, height 5\' 3"  (1.6 m), weight 219 lb (99.3 kg), SpO2 98 %.   General: No apparent distress alert and oriented x3 mood and affect normal, dressed appropriately.  HEENT: Pupils equal, extraocular movements intact  Respiratory: Patient's speak in full sentences and does not appear short of breath  Cardiovascular: No lower extremity edema, non tender, no erythema  Right shoulder exam does have limited range of motion secondary to voluntary guarding and cuff with positive empty can.  3+ out  of 5 strength noted.  Severe positive crossover noted.  Limited muscular skeletal ultrasound was performed and interpreted by , M  Limited ultrasound of patient's acromioclavicular joint does show that patient does have narrowing of the acromioclavicular joint.  Effusion noted with hypoechoic changes.  No cortical irregularity noted of the bone otherwise.  Patient does have some degenerative changes of the rotator cuff as well.  Mild narrowing of the rotator cuff. Impression: Rotator cuff arthropathy, AC arthritis  Procedure: Real-time Ultrasound Guided Injection of right acromioclavicular joint Device: GE Logiq Q7 Ultrasound guided injection is preferred based studies that show increased duration, increased effect, greater accuracy, decreased procedural pain, increased response rate, and decreased cost with ultrasound guided versus blind injection.  Verbal informed consent obtained.  Time-out conducted.  Noted no overlying erythema, induration, or other signs of local infection.  Skin prepped in a sterile fashion.  Local  anesthesia: Topical Ethyl chloride.  With sterile technique and under real time ultrasound guidance: With a 25-gauge half inch needle injected with 0.5 cc 0.5% Marcaine and 0.5 cc of Kenalog 40 mg/mL Completed without difficulty  Pain immediately resolved suggesting accurate placement of the medication.  Advised to call if fevers/chills, erythema, induration, drainage, or persistent bleeding.  Impression: Technically successful ultrasound guided injection.    Impression and Recommendations:    The above documentation has been reviewed and is accurate and complete Judi Saa, DO

## 2022-04-10 ENCOUNTER — Encounter: Payer: Self-pay | Admitting: Family Medicine

## 2022-04-10 ENCOUNTER — Ambulatory Visit: Payer: 59 | Admitting: Family Medicine

## 2022-04-10 ENCOUNTER — Ambulatory Visit (INDEPENDENT_AMBULATORY_CARE_PROVIDER_SITE_OTHER): Payer: 59

## 2022-04-10 ENCOUNTER — Ambulatory Visit: Payer: Self-pay

## 2022-04-10 VITALS — BP 136/76 | HR 72 | Ht 63.0 in | Wt 219.0 lb

## 2022-04-10 DIAGNOSIS — M25511 Pain in right shoulder: Secondary | ICD-10-CM | POA: Diagnosis not present

## 2022-04-10 DIAGNOSIS — G8929 Other chronic pain: Secondary | ICD-10-CM | POA: Diagnosis not present

## 2022-04-10 DIAGNOSIS — M19011 Primary osteoarthritis, right shoulder: Secondary | ICD-10-CM | POA: Diagnosis not present

## 2022-04-10 NOTE — Patient Instructions (Addendum)
Good to see you! Injected shoulder today Xray today Start exercises after the weekend Ice is your friend

## 2022-04-10 NOTE — Assessment & Plan Note (Signed)
Chronic difficulties.  Patient has had an injection on the contralateral side twice.  Hopefully this will make significant with his well.  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  Otherwise would need to consider the possibility of an MRI.  Patient will continue with conservative therapy at this point and follow-up again in 6 to 8 weeks

## 2022-04-22 ENCOUNTER — Ambulatory Visit: Payer: 59 | Admitting: Family Medicine

## 2022-05-19 NOTE — Progress Notes (Unsigned)
[facility] [address] [place] [address] Phone: [phone] Subjective:   [name], am serving as a scribe for Dr. Antoine Primas.  I'm seeing this patient by the request  of:  [name], MD  CC: Right shoulder pain follow-up  NOB:[id number]  04/10/2022 Chronic difficulties.  Patient has had an injection on the contralateral side twice.  Hopefully this will make significant with his well.  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  Otherwise would need to consider the possibility of an MRI.  Patient will continue with conservative therapy at this point and follow-up again in 6 to 8 weeks  Update 05/20/2022 [facility] is a 66 y.o. female coming in with complaint of R shoulder pain. Patient states today the right  shoulder and right hip are hurting. Pains started about 1-2 weeks ago. Injection in the right shoulder last visit helped but is starting to wake up on a more regular basis.. No other issues.     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    TOENAIL EXCISION Left 12/2020   2nd digit    TOTAL KNEE ARTHROPLASTY Left 07/27/2020   Procedure: LEFT TOTAL KNEE ARTHROPLASTY;  Surgeon: Kathryne Hitch, MD;  Location: [facility];  Service: Orthopedics;  Laterality: Left;   Social History   Socioeconomic History   Marital status: Married    Spouse name: Not on file   Number of children: Not on file   Years of education: Not on file   Highest education level: Not on file  Occupational History   Not on file  Tobacco Use   Smoking status: Never   Smokeless tobacco: Never  Vaping Use   Vaping Use: Never used  Substance and Sexual Activity    Alcohol use: Never    Alcohol/week: 0.0 standard drinks of alcohol   Drug use: Never   Sexual activity: Not on file  Other Topics Concern   Not on file  Social History Narrative   Right handed   Two story home   Drinks caffeine   Social Determinants of Health   Financial Resource Strain: Not on file  Food Insecurity: Not on file  Transportation Needs: Not on file  Physical Activity: Not on file  Stress: Not on file  Social Connections: Not on file   Allergies  Allergen Reactions   Gabapentin Nausea And Vomiting   Meloxicam    Penicillins Rash   Family History  Problem Relation Age of Onset   Heart disease Mother    Healthy Brother      Current Outpatient Medications (Cardiovascular):    losartan-hydrochlorothiazide (HYZAAR) 100-12.5 MG tablet, Take 1 tablet by mouth daily.   rosuvastatin (CRESTOR) 20 MG tablet, Take 20 mg by mouth daily.   Current Outpatient Medications (Analgesics):    Adalimumab (HUMIRA) 40 MG/0.4ML PSKT, Inject 40 mg into the skin every 14 (fourteen) days.    Current Outpatient Medications (Other):    bimatoprost (LUMIGAN) 0.03 % ophthalmic solution, Place 1 drop into both eyes at bedtime.    COMBIGAN 0.2-0.5 % ophthalmic solution, Place 1 drop into both eyes 2 (  two) times daily.    Multiple Vitamin (MULTIVITAMIN PO), Take 1 tablet by mouth daily.    mycophenolate (CELLCEPT) 500 MG tablet, Take 1,500 mg by mouth 2 (two) times daily.    PENNSAID 2 % SOLN, APPLY 2 PUMPS TOPICALLY TO THE AFFECTED AREA(S) TWICE DAILY AS DIRECTED   polyethylene glycol powder (GLYCOLAX/MIRALAX) 17 GM/SCOOP powder, Take 1 Container by mouth daily.    tacrolimus (PROGRAF) 1 MG capsule, Take 2 mg by mouth 2 (two) times daily.    terbinafine (LAMISIL) 250 MG tablet, Take 250 mg by mouth daily.   tiZANidine (ZANAFLEX) 2 MG tablet, TAKE 1 TABLET BY MOUTH EVERYDAY AT BEDTIME   Reviewed prior external information including notes and imaging from  primary care provider As  well as notes that were available from care everywhere and other healthcare systems.  Past medical history, social, surgical and family history all reviewed in electronic medical record.  No pertanent information unless stated regarding to the chief complaint.   Review of Systems:  No headache, visual changes, nausea, vomiting, diarrhea, constipation, dizziness, abdominal pain, skin rash, fevers, chills, night sweats, weight loss, swollen lymph nodes, body aches, joint swelling, chest pain, shortness of breath, mood changes. POSITIVE muscle aches  Objective  Blood pressure 134/80, pulse 88, height 5\' 3"  (1.6 m), weight 221 lb (100.2 kg), SpO2 97 %.   General: No apparent distress alert and oriented x3 mood and affect normal, dressed appropriately.  HEENT: Pupils equal, extraocular movements intact  Respiratory: Patient's speak in full sentences and does not appear short of breath  Cardiovascular: No lower extremity edema, non tender, no erythema  Shoulder: Right Inspection reveals no abnormalities, atrophy or asymmetry. Palpation is normal with no tenderness over AC joint or bicipital groove. ROM is full in all planes passively. Rotator cuff strength normal throughout. signs of impingement with positive Neer and Hawkin's tests, but negative empty can sign. Speeds and Yergason's tests normal. No labral pathology noted with negative Obrien's, negative clunk and good stability. Normal scapular function observed. No painful arc and no drop arm sign. No apprehension sign  Procedure: Real-time Ultrasound Guided Injection of right acromioclavicular joint Device: GE Logiq Q7 Ultrasound guided injection is preferred based studies that show increased duration, increased effect, greater accuracy, decreased procedural pain, increased response rate, and decreased cost with ultrasound guided versus blind injection.  Verbal informed consent obtained.  Time-out conducted.  Noted no overlying erythema,  induration, or other signs of local infection.  Skin prepped in a sterile fashion.  Local anesthesia: Topical Ethyl chloride.  With sterile technique and under real time ultrasound guidance: With a 25-gauge half inch needle injecting 0.5 cc of 0.5% Marcaine and 0.5 cc of Kenalog 40 mg/mL. Completed without difficulty  Pain immediately resolved suggesting accurate placement of the medication.  Advised to call if fevers/chills, erythema, induration, drainage, or persistent bleeding.  Impression: Technically successful ultrasound guided injection.  Procedure: Real-time Ultrasound Guided Injection of right subacromial space Device: GE Logiq E  Ultrasound guided injection is preferred based studies that show increased duration, increased effect, greater accuracy, decreased procedural pain, increased response rate with ultrasound guided versus blind injection.  Verbal informed consent obtained.  Time-out conducted.  Noted no overlying erythema, induration, or other signs of local infection.  Skin prepped in a sterile fashion.  Local anesthesia: Topical Ethyl chloride.  With sterile technique and under real time ultrasound guidance:  Joint visualized.  23g 1  inch needle inserted posterior approach. Pictures taken for needle placement.  Patient did have injection of 2 cc of 0.5% Marcaine, and 1.0 cc of Kenalog 40 mg/dL. Completed without difficulty  Pain immediately resolved suggesting accurate placement of the medication.  Advised to call if fevers/chills, erythema, induration, drainage, or persistent bleeding.  Images permanently stored and available for review in the ultrasound unitImpression: Technically successful ultrasound guided injection.    Impression and Recommendations:     The above documentation has been reviewed and is accurate and complete Judi Saa, DO

## 2022-05-20 ENCOUNTER — Encounter: Payer: Self-pay | Admitting: Family Medicine

## 2022-05-20 ENCOUNTER — Ambulatory Visit: Payer: 59 | Admitting: Family Medicine

## 2022-05-20 ENCOUNTER — Ambulatory Visit: Payer: Self-pay

## 2022-05-20 VITALS — BP 134/80 | HR 88 | Ht 63.0 in | Wt 221.0 lb

## 2022-05-20 DIAGNOSIS — M25511 Pain in right shoulder: Secondary | ICD-10-CM | POA: Diagnosis not present

## 2022-05-20 DIAGNOSIS — M7551 Bursitis of right shoulder: Secondary | ICD-10-CM | POA: Diagnosis not present

## 2022-05-20 DIAGNOSIS — G8929 Other chronic pain: Secondary | ICD-10-CM

## 2022-05-20 DIAGNOSIS — M19011 Primary osteoarthritis, right shoulder: Secondary | ICD-10-CM

## 2022-05-20 NOTE — Patient Instructions (Addendum)
Injected AC and shoulder joint today Good to see you! If not significantly better in 2-3 weeks send a message and we'll schedule a MRI

## 2022-05-20 NOTE — Assessment & Plan Note (Signed)
No chronic problem with exacerbation.  We did discuss with patient that if this continues to give her difficulty advanced imaging would be warranted at this time.  Patient has intermittently had this problem for now greater than 2 years and has done formal physical therapy.  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  Follow-up with me again 6 to 8 weeks otherwise.

## 2022-05-20 NOTE — Assessment & Plan Note (Signed)
Repeat right-sided injection today.  Discussed icing regimen and home exercises.  Discussed which activities to do and which ones to avoid.  Increase activity slowly otherwise.  Follow-up again in 6 to 8 weeks

## 2022-06-13 ENCOUNTER — Ambulatory Visit: Payer: 59 | Admitting: Family Medicine

## 2022-06-25 NOTE — Progress Notes (Signed)
[facility] [place] Phone: [phone] Subjective:    I'm seeing this patient by the request  of:  Charleston Poot, MD  CC: Bilateral shoulder pain  [id number]  05/20/2022 Repeat right-sided injection today.  Discussed icing regimen and home exercises.  Discussed which activities to do and which ones to avoid.  Increase activity slowly otherwise.  Follow-up again in 6 to 8 weeks  No chronic problem with exacerbation.  We did discuss with patient that if this continues to give her difficulty advanced imaging would be warranted at this time.  Patient has intermittently had this problem for now greater than 2 years and has done formal physical therapy.  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  Follow-up with me again 6 to 8 weeks otherwise.    Update 07/01/2022 Olivia [facility] is a 66 y.o. female coming in with complaint of R shoulder pain. Patient states that the last three weeks bilateral shoulder pain worse with laying down. L>R. Front and lateral shoulder pain that radiates down the arms, denies and numbness or tingling. Patient trying ibuprofen, topical rubs, icy hot, heating pad and nothing is helping.       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    TOENAIL EXCISION Left 12/2020   2nd digit    TOTAL KNEE ARTHROPLASTY Left 07/27/2020   Procedure: LEFT TOTAL KNEE ARTHROPLASTY;  Surgeon: Mcarthur Rossetti, MD;  Location: [facility];  Service: Orthopedics;  Laterality: Left;   Social History   Socioeconomic History   Marital status: Married    Spouse name: Not on file   Number of  children: Not on file   Years of education: Not on file   Highest education level: Not on file  Occupational History   Not on file  Tobacco Use   Smoking status: Never   Smokeless tobacco: Never  Vaping Use   Vaping Use: Never used  Substance and Sexual Activity   Alcohol use: Never    Alcohol/week: 0.0 standard drinks of alcohol   Drug use: Never   Sexual activity: Not on file  Other Topics Concern   Not on file  Social History Narrative   Right handed   Two story home   Drinks caffeine   Social Determinants of Health   Financial Resource Strain: Not on file  Food Insecurity: Not on file  Transportation Needs: Not on file  Physical Activity: Not on file  Stress: Not on file  Social Connections: Not on file   Allergies  Allergen Reactions   Gabapentin Nausea And Vomiting   Meloxicam    Penicillins Rash   Family History  Problem Relation Age of Onset   Heart disease Mother    Healthy Brother      Current Outpatient Medications (Cardiovascular):    losartan-hydrochlorothiazide (HYZAAR) 100-12.5 MG tablet, Take 1 tablet by mouth daily.   rosuvastatin (CRESTOR) 20 MG tablet, Take 20 mg by mouth daily.   Current Outpatient Medications (Analgesics):    Adalimumab (HUMIRA) 40 MG/0.4ML PSKT, Inject 40 mg into the skin every 14 (  fourteen) days.    Current Outpatient Medications (Other):    bimatoprost (LUMIGAN) 0.03 % ophthalmic solution, Place 1 drop into both eyes at bedtime.    COMBIGAN 0.2-0.5 % ophthalmic solution, Place 1 drop into both eyes 2 (two) times daily.    Multiple Vitamin (MULTIVITAMIN PO), Take 1 tablet by mouth daily.    mycophenolate (CELLCEPT) 500 MG tablet, Take 1,500 mg by mouth 2 (two) times daily.    PENNSAID 2 % SOLN, APPLY 2 PUMPS TOPICALLY TO THE AFFECTED AREA(S) TWICE DAILY AS DIRECTED   polyethylene glycol powder (GLYCOLAX/MIRALAX) 17 GM/SCOOP powder, Take 1 Container by mouth daily.    tacrolimus (PROGRAF) 1 MG capsule, Take 2 mg by  mouth 2 (two) times daily.    terbinafine (LAMISIL) 250 MG tablet, Take 250 mg by mouth daily.   tiZANidine (ZANAFLEX) 2 MG tablet, TAKE 1 TABLET BY MOUTH EVERYDAY AT BEDTIME   Reviewed prior external information including notes and imaging from  primary care provider As well as notes that were available from care everywhere and other healthcare systems.  Past medical history, social, surgical and family history all reviewed in electronic medical record.  No pertanent information unless stated regarding to the chief complaint.   Review of Systems:  No headache, visual changes, nausea, vomiting, diarrhea, constipation, dizziness, abdominal pain, skin rash, fevers, chills, night sweats, weight loss, swollen lymph nodes, body aches, joint swelling, chest pain, shortness of breath, mood changes. POSITIVE muscle aches  Objective  Blood pressure 122/80, pulse 62, height 5\' 3"  (1.6 m), weight 225 lb (102.1 kg), SpO2 96 %.   General: No apparent distress alert and oriented x3 mood and affect normal, dressed appropriately.  HEENT: Pupils equal, extraocular movements intact  Respiratory: Patient's speak in full sentences and does not appear short of breath  Cardiovascular: No lower extremity edema, non tender, no erythema  Antalgic gait noted.  Patient's right shoulder does have significant weakness with 3 out of 5 strength.  Patient is unable to increase lifting Roberts above 90 degrees without help from the contralateral side.  Positive impingement.  Positive drop arm sign. Contralateral shoulder does have positive impingement noted.  More pain with crossover though.  No swelling over the acromioclavicular joint  Procedure: Real-time Ultrasound Guided Injection of left acromioclavicular joint Device: GE Logiq Q7 Ultrasound guided injection is preferred based studies that show increased duration, increased effect, greater accuracy, decreased procedural pain, increased response rate, and decreased  cost with ultrasound guided versus blind injection.  Verbal informed consent obtained.  Time-out conducted.  Noted no overlying erythema, induration, or other signs of local infection.  Skin prepped in a sterile fashion.  Local anesthesia: Topical Ethyl chloride.  With sterile technique and under real time ultrasound guidance: With a 25-gauge half inch needle injected with 0.5 cc of 0.5% Marcaine and 0.5 cc of Kenalog 40 mg/mL Completed without difficulty  Pain immediately improved suggesting accurate placement of the medication.  Advised to call if fevers/chills, erythema, induration, drainage, or persistent bleeding.  Impression: Technically successful ultrasound guided injection.   Impression and Recommendations:    The above documentation has been reviewed and is accurate and complete Lyndal Pulley, DO

## 2022-07-01 ENCOUNTER — Ambulatory Visit: Payer: Self-pay

## 2022-07-01 ENCOUNTER — Encounter: Payer: Self-pay | Admitting: Family Medicine

## 2022-07-01 ENCOUNTER — Ambulatory Visit: Payer: 59 | Admitting: Family Medicine

## 2022-07-01 VITALS — BP 122/80 | HR 62 | Ht 63.0 in | Wt 225.0 lb

## 2022-07-01 DIAGNOSIS — G8929 Other chronic pain: Secondary | ICD-10-CM

## 2022-07-01 DIAGNOSIS — M19012 Primary osteoarthritis, left shoulder: Secondary | ICD-10-CM | POA: Diagnosis not present

## 2022-07-01 DIAGNOSIS — M75101 Unspecified rotator cuff tear or rupture of right shoulder, not specified as traumatic: Secondary | ICD-10-CM | POA: Diagnosis not present

## 2022-07-01 DIAGNOSIS — M25511 Pain in right shoulder: Secondary | ICD-10-CM | POA: Diagnosis not present

## 2022-07-01 NOTE — Patient Instructions (Addendum)
Good to see you  MRI without right shoulder ordered to [facility] [phone] Injection given in the left shoulder today  Follow up 6-8 weeks

## 2022-07-01 NOTE — Assessment & Plan Note (Signed)
I am concerned now at this time that there is a potential large rotator cuff tear.  Something seems to have increased at the moment.  I am concerned that this is worsening.  Do feel at this time we do need to consider advanced imaging with an MRI so patient has had an opportunity for surgical intervention if necessary.  Known arthritic changes of the shoulder and we will monitor.  If too bad for surgery we will discuss other treatment options.  Patient has failed things such as injections, home exercises, as well as formal physical therapy at this point and does have significant weakness of the rotator cuff.

## 2022-07-01 NOTE — Assessment & Plan Note (Signed)
Left-sided this time.  Worsening pain likely compensating for now the weakness we are noticing on the right side.  does seem to be more of the rotator cuff on the right side that is concerning.  hopefully patient does respond relatively well at this time.  discussed icing regimen and home exercises, which activities to do and which ones to avoid.  we will follow-up again in 6 to 8 weeks.

## 2022-07-13 ENCOUNTER — Ambulatory Visit
Admission: RE | Admit: 2022-07-13 | Discharge: 2022-07-13 | Disposition: A | Discharge status: Home / Self Care | Payer: 59 | Source: Ambulatory Visit | Attending: Family Medicine | Admitting: Family Medicine

## 2022-07-13 DIAGNOSIS — G8929 Other chronic pain: Secondary | ICD-10-CM

## 2022-07-14 ENCOUNTER — Encounter: Payer: Self-pay | Admitting: Family Medicine

## 2022-07-15 ENCOUNTER — Other Ambulatory Visit: Payer: Self-pay

## 2022-07-15 DIAGNOSIS — M75101 Unspecified rotator cuff tear or rupture of right shoulder, not specified as traumatic: Secondary | ICD-10-CM

## 2022-08-14 NOTE — Progress Notes (Deleted)
[facility] [address] [place] [address] Phone: [phone] Subjective:    I'm seeing this patient by the request  of:  [name], MD  CC:   [id number]  07/01/2022 I am concerned now at this time that there is a potential large rotator cuff tear.  Something seems to have increased at the moment.  I am concerned that this is worsening.  Do feel at this time we do need to consider advanced imaging with an MRI so patient has had an opportunity for surgical intervention if necessary.  Known arthritic changes of the shoulder and we will monitor.  If too bad for surgery we will discuss other treatment options.  Patient has failed things such as injections, home exercises, as well as formal physical therapy at this point and does have significant weakness of the rotator cuff.     Update 08/22/2022 [facility] is a 66 y.o. female coming in with complaint of R shoulder pain. 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    TOENAIL EXCISION Left 12/2020   2nd digit    TOTAL KNEE ARTHROPLASTY Left 07/27/2020   Procedure: LEFT TOTAL KNEE ARTHROPLASTY;  Surgeon: Kathryne Hitch, MD;  Location: [facility];  Service: Orthopedics;  Laterality: Left;   Social History   Socioeconomic History   Marital status: Married    Spouse name: Not on file   Number of children: Not on file   Years of education: Not on file   Highest education level: Not on file  Occupational History   Not on file  Tobacco Use   Smoking status: Never   Smokeless tobacco: Never  Vaping Use   Vaping Use: Never used  Substance and Sexual Activity   Alcohol use: Never    Alcohol/week: 0.0 standard drinks of alcohol    Drug use: Never   Sexual activity: Not on file  Other Topics Concern   Not on file  Social History Narrative   Right handed   Two story home   Drinks caffeine   Social Determinants of Health   Financial Resource Strain: Not on file  Food Insecurity: Not on file  Transportation Needs: Not on file  Physical Activity: Not on file  Stress: Not on file  Social Connections: Not on file   Allergies  Allergen Reactions   Gabapentin Nausea And Vomiting   Meloxicam    Penicillins Rash   Family History  Problem Relation Age of Onset   Heart disease Mother    Healthy Brother      Current Outpatient Medications (Cardiovascular):    losartan-hydrochlorothiazide (HYZAAR) 100-12.5 MG tablet, Take 1 tablet by mouth daily.   rosuvastatin (CRESTOR) 20 MG tablet, Take 20 mg by mouth daily.   Current Outpatient Medications (Analgesics):    Adalimumab (HUMIRA) 40 MG/0.4ML PSKT, Inject 40 mg into the skin every 14 (fourteen) days.    Current Outpatient Medications (Other):    bimatoprost (LUMIGAN) 0.03 % ophthalmic solution, Place 1 drop into both eyes at bedtime.    COMBIGAN 0.2-0.5 % ophthalmic solution, Place 1 drop into both eyes 2 (two) times daily.  Multiple Vitamin (MULTIVITAMIN PO), Take 1 tablet by mouth daily.    mycophenolate (CELLCEPT) 500 MG tablet, Take 1,500 mg by mouth 2 (two) times daily.    PENNSAID 2 % SOLN, APPLY 2 PUMPS TOPICALLY TO THE AFFECTED AREA(S) TWICE DAILY AS DIRECTED   polyethylene glycol powder (GLYCOLAX/MIRALAX) 17 GM/SCOOP powder, Take 1 Container by mouth daily.    tacrolimus (PROGRAF) 1 MG capsule, Take 2 mg by mouth 2 (two) times daily.    terbinafine (LAMISIL) 250 MG tablet, Take 250 mg by mouth daily.   tiZANidine (ZANAFLEX) 2 MG tablet, TAKE 1 TABLET BY MOUTH EVERYDAY AT BEDTIME   Reviewed prior external information including notes and imaging from  primary care provider As well as notes that were available from care everywhere and other  healthcare systems.  Past medical history, social, surgical and family history all reviewed in electronic medical record.  No pertanent information unless stated regarding to the chief complaint.   Review of Systems:  No headache, visual changes, nausea, vomiting, diarrhea, constipation, dizziness, abdominal pain, skin rash, fevers, chills, night sweats, weight loss, swollen lymph nodes, body aches, joint swelling, chest pain, shortness of breath, mood changes. POSITIVE muscle aches  Objective  There were no vitals taken for this visit.   General: No apparent distress alert and oriented x3 mood and affect normal, dressed appropriately.  HEENT: Pupils equal, extraocular movements intact  Respiratory: Patient's speak in full sentences and does not appear short of breath  Cardiovascular: No lower extremity edema, non tender, no erythema      Impression and Recommendations:

## 2022-08-21 ENCOUNTER — Ambulatory Visit: Payer: 59 | Admitting: Family Medicine

## 2022-10-14 NOTE — Progress Notes (Unsigned)
[place] Phone: [phone] Subjective:   Olivia Barnes, am serving as a scribe for Dr. Hulan Saas.  I'm seeing this patient by the request  of:  Charleston Poot, MD  CC: left arm pain   [id number]  07/01/2022 I am concerned now at this time that there is a potential large rotator cuff tear.  Something seems to have increased at the moment.  I am concerned that this is worsening.  Do feel at this time we do need to consider advanced imaging with an MRI so patient has had an opportunity for surgical intervention if necessary.  Known arthritic changes of the shoulder and we will monitor.  If too bad for surgery we will discuss other treatment options.  Patient has failed things such as injections, home exercises, as well as formal physical therapy at this point and does have significant weakness of the rotator cuff     Left-sided this time.  Worsening pain likely compensating for now the weakness we are noticing on the right side.  does seem to be more of the rotator cuff on the right side that is concerning.  hopefully patient does respond relatively well at this time.  discussed icing regimen and home exercises, which activities to do and which ones to avoid.  we will follow-up again in 6 to 8 weeks     Updated 10/15/2022 Olivia Barnes is a 67 y.o. female coming in with complaint of L arm pain.  R RTC surgery in December 2023. Pain in L shoulder due to using it more since surgery. Pain in anterior aspect. Barnes numbness or tingling. Does have some pain in L side of neck as well.        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    TOENAIL EXCISION Left 12/2020   2nd digit    TOTAL  KNEE ARTHROPLASTY Left 07/27/2020   Procedure: LEFT TOTAL KNEE ARTHROPLASTY;  Surgeon: Mcarthur Rossetti, MD;  Location: [facility];  Service: Orthopedics;  Laterality: Left;   Social History   Socioeconomic History   Marital status: Married    Spouse name: Not on file   Number of children: Not on file   Years of education: Not on file   Highest education level: Not on file  Occupational History   Not on file  Tobacco Use   Smoking status: Never   Smokeless tobacco: Never  Vaping Use   Vaping Use: Never used  Substance and Sexual Activity   Alcohol use: Never    Alcohol/week: 0.0 standard drinks of alcohol   Drug use: Never   Sexual activity: Not on file  Other Topics Concern   Not on file  Social History Narrative   Right handed   Two story home   Drinks caffeine   Social Determinants of Health   Financial Resource Strain: Not on file  Food Insecurity: Not on file  Transportation Needs: Not on file  Physical Activity: Not on file  Stress: Not on file  Social Connections: Not on file   Allergies  Allergen Reactions   Gabapentin Nausea And Vomiting   Meloxicam  Penicillins Rash   Family History  Problem Relation Age of Onset   Heart disease Mother    Healthy Brother      Current Outpatient Medications (Cardiovascular):    losartan-hydrochlorothiazide (HYZAAR) 100-12.5 MG tablet, Take 1 tablet by mouth daily.   rosuvastatin (CRESTOR) 20 MG tablet, Take 20 mg by mouth daily.   Current Outpatient Medications (Analgesics):    Adalimumab (HUMIRA) 40 MG/0.4ML PSKT, Inject 40 mg into the skin every 14 (fourteen) days.    Current Outpatient Medications (Other):    bimatoprost (LUMIGAN) 0.03 % ophthalmic solution, Place 1 drop into both eyes at bedtime.    COMBIGAN 0.2-0.5 % ophthalmic solution, Place 1 drop into both eyes 2 (two) times daily.    Multiple Vitamin (MULTIVITAMIN PO), Take 1 tablet by mouth daily.    mycophenolate (CELLCEPT) 500 MG tablet, Take  1,500 mg by mouth 2 (two) times daily.    PENNSAID 2 % SOLN, APPLY 2 PUMPS TOPICALLY TO THE AFFECTED AREA(S) TWICE DAILY AS DIRECTED   polyethylene glycol powder (GLYCOLAX/MIRALAX) 17 GM/SCOOP powder, Take 1 Container by mouth daily.    tacrolimus (PROGRAF) 1 MG capsule, Take 2 mg by mouth 2 (two) times daily.    terbinafine (LAMISIL) 250 MG tablet, Take 250 mg by mouth daily.   tiZANidine (ZANAFLEX) 2 MG tablet, TAKE 1 TABLET BY MOUTH EVERYDAY AT BEDTIME   Reviewed prior external information including notes and imaging from  primary care provider As well as notes that were available from care everywhere and other healthcare systems.  Past medical history, social, surgical and family history all reviewed in electronic medical record.  Barnes pertanent information unless stated regarding to the chief complaint.   Review of Systems:  Barnes headache, visual changes, nausea, vomiting, diarrhea, constipation, dizziness, abdominal pain, skin rash, fevers, chills, night sweats, weight loss, swollen lymph nodes, body aches, joint swelling, chest pain, shortness of breath, mood changes. POSITIVE muscle aches  Objective  Blood pressure (!) 168/96, pulse 73, height 5' 3"$  (1.6 m), weight 225 lb (102.1 kg), SpO2 98 %.   General: Barnes apparent distress alert and oriented x3 mood and affect normal, dressed appropriately.  HEENT: Pupils equal, extraocular movements intact  Respiratory: Patient's speak in full sentences and does not appear short of breath  Cardiovascular: Barnes lower extremity edema, non tender, Barnes erythema  Left arm does have some impingement noted.  Patient does have some limited range of motion noted as well today.  Positive crossover.  Significant voluntary guarding noted. Limited muscular skeletal ultrasound was performed and interpreted by Hulan Saas, M  Limited ultrasound due to patient's body habitus is difficult to see the rotator cuff completely.  Does have hypoechoic changes that can be  seen that is significant for more of a tendinitis but cannot see if there is any true tearing.  Significant hypoechoic changes still noted of the acromioclavicular joint with significant narrowing.  Procedure: Real-time Ultrasound Guided Injection of left glenohumeral joint Device: GE Logiq E  Ultrasound guided injection is preferred based studies that show increased duration, increased effect, greater accuracy, decreased procedural pain, increased response rate with ultrasound guided versus blind injection.  Verbal informed consent obtained.  Time-out conducted.  Noted Barnes overlying erythema, induration, or other signs of local infection.  Skin prepped in a sterile fashion.  Local anesthesia: Topical Ethyl chloride.  With sterile technique and under real time ultrasound guidance:  Joint visualized.  21g 2 inch needle inserted posterior approach. Pictures taken for needle placement. Patient  did have injection of 2 cc of 0.5% Marcaine, and 1cc of Kenalog 40 mg/dL. Completed without difficulty  Pain immediately resolved suggesting accurate placement of the medication.  Advised to call if fevers/chills, erythema, induration, drainage, or persistent bleeding.  Impression: Technically successful ultrasound guided injection.  Procedure: Real-time Ultrasound Guided Injection of left acromioclavicular joint Device: GE Logiq Q7 Ultrasound guided injection is preferred based studies that show increased duration, increased effect, greater accuracy, decreased procedural pain, increased response rate, and decreased cost with ultrasound guided versus blind injection.  Verbal informed consent obtained.  Time-out conducted.  Noted Barnes overlying erythema, induration, or other signs of local infection.  Skin prepped in a sterile fashion.  Local anesthesia: Topical Ethyl chloride.  With sterile technique and under real time ultrasound guidance: With a 25-gauge half inch needle injected with 0.5 cc of 0.5%  Marcaine and 0.5 cc of Kenalog 40 mg/mL Completed without difficulty  Pain immediately resolved suggesting accurate placement of the medication.  Advised to call if fevers/chills, erythema, induration, drainage, or persistent bleeding.  Impression: Technically successful ultrasound guided injection.     Impression and Recommendations:     The above documentation has been reviewed and is accurate and complete Lyndal Pulley, DO

## 2022-10-15 ENCOUNTER — Ambulatory Visit: Payer: 59 | Admitting: Family Medicine

## 2022-10-15 ENCOUNTER — Encounter: Payer: Self-pay | Admitting: Family Medicine

## 2022-10-15 ENCOUNTER — Ambulatory Visit: Payer: Self-pay

## 2022-10-15 ENCOUNTER — Ambulatory Visit (INDEPENDENT_AMBULATORY_CARE_PROVIDER_SITE_OTHER): Payer: 59

## 2022-10-15 VITALS — BP 168/96 | HR 73 | Ht 63.0 in | Wt 225.0 lb

## 2022-10-15 DIAGNOSIS — M79602 Pain in left arm: Secondary | ICD-10-CM | POA: Diagnosis not present

## 2022-10-15 DIAGNOSIS — M7552 Bursitis of left shoulder: Secondary | ICD-10-CM

## 2022-10-15 DIAGNOSIS — M542 Cervicalgia: Secondary | ICD-10-CM

## 2022-10-15 DIAGNOSIS — M25512 Pain in left shoulder: Secondary | ICD-10-CM

## 2022-10-15 DIAGNOSIS — M19012 Primary osteoarthritis, left shoulder: Secondary | ICD-10-CM

## 2022-10-15 NOTE — Patient Instructions (Addendum)
Xray today Injected shoulder in 2 locations Same exercises for the L and R  Ice before bed for 20 min  See me in 7-8 weeks

## 2022-10-15 NOTE — Assessment & Plan Note (Signed)
Chronic problem with worsening symptoms, 4 months since the previous injection.  Hopeful that this will last longer.  Discussed icing regimen and home exercises, increase activity slowly.  Worsening symptoms may need advanced imaging

## 2022-10-15 NOTE — Assessment & Plan Note (Signed)
Chronic, with worsening symptoms.  Given injection.  Did respond extremely well to this previously and hopefully will help again.  Discussed with patient about icing regimen and home exercises, but likely irritated secondary to patient's other social determinants of health and recent surgery of the rotator cuff on the contralateral side.  Follow-up again in 6 to 8 weeks

## 2022-10-27 ENCOUNTER — Other Ambulatory Visit: Payer: Self-pay

## 2022-10-27 ENCOUNTER — Encounter (HOSPITAL_BASED_OUTPATIENT_CLINIC_OR_DEPARTMENT_OTHER): Payer: Self-pay | Admitting: Urology

## 2022-10-27 ENCOUNTER — Inpatient Hospital Stay (HOSPITAL_BASED_OUTPATIENT_CLINIC_OR_DEPARTMENT_OTHER)
Admission: EM | Admit: 2022-10-27 | Discharge: 2022-10-29 | DRG: 638 | Disposition: A | Discharge status: Home / Self Care | Payer: 59 | Source: Ambulatory Visit | Attending: Internal Medicine | Admitting: Internal Medicine

## 2022-10-27 DIAGNOSIS — H44113 Panuveitis, bilateral: Secondary | ICD-10-CM | POA: Diagnosis present

## 2022-10-27 DIAGNOSIS — Z886 Allergy status to analgesic agent status: Secondary | ICD-10-CM

## 2022-10-27 DIAGNOSIS — Z8249 Family history of ischemic heart disease and other diseases of the circulatory system: Secondary | ICD-10-CM

## 2022-10-27 DIAGNOSIS — Z7960 Long term (current) use of unspecified immunomodulators and immunosuppressants: Secondary | ICD-10-CM | POA: Diagnosis not present

## 2022-10-27 DIAGNOSIS — D84821 Immunodeficiency due to drugs: Secondary | ICD-10-CM | POA: Diagnosis present

## 2022-10-27 DIAGNOSIS — E861 Hypovolemia: Secondary | ICD-10-CM | POA: Diagnosis present

## 2022-10-27 DIAGNOSIS — E871 Hypo-osmolality and hyponatremia: Secondary | ICD-10-CM | POA: Diagnosis present

## 2022-10-27 DIAGNOSIS — I160 Hypertensive urgency: Secondary | ICD-10-CM | POA: Diagnosis present

## 2022-10-27 DIAGNOSIS — B029 Zoster without complications: Secondary | ICD-10-CM | POA: Diagnosis present

## 2022-10-27 DIAGNOSIS — R739 Hyperglycemia, unspecified: Principal | ICD-10-CM

## 2022-10-27 DIAGNOSIS — Z96652 Presence of left artificial knee joint: Secondary | ICD-10-CM | POA: Diagnosis present

## 2022-10-27 DIAGNOSIS — M064 Inflammatory polyarthropathy: Secondary | ICD-10-CM | POA: Diagnosis present

## 2022-10-27 DIAGNOSIS — I10 Essential (primary) hypertension: Secondary | ICD-10-CM | POA: Diagnosis present

## 2022-10-27 DIAGNOSIS — Z6839 Body mass index (BMI) 39.0-39.9, adult: Secondary | ICD-10-CM | POA: Diagnosis not present

## 2022-10-27 DIAGNOSIS — E1100 Type 2 diabetes mellitus with hyperosmolarity without nonketotic hyperglycemic-hyperosmolar coma (NKHHC): Secondary | ICD-10-CM | POA: Diagnosis present

## 2022-10-27 DIAGNOSIS — N179 Acute kidney failure, unspecified: Secondary | ICD-10-CM | POA: Diagnosis present

## 2022-10-27 DIAGNOSIS — E669 Obesity, unspecified: Secondary | ICD-10-CM | POA: Diagnosis present

## 2022-10-27 DIAGNOSIS — E785 Hyperlipidemia, unspecified: Secondary | ICD-10-CM | POA: Diagnosis present

## 2022-10-27 DIAGNOSIS — E875 Hyperkalemia: Secondary | ICD-10-CM | POA: Diagnosis present

## 2022-10-27 DIAGNOSIS — Z880 Allergy status to penicillin: Secondary | ICD-10-CM | POA: Diagnosis not present

## 2022-10-27 DIAGNOSIS — Z79899 Other long term (current) drug therapy: Secondary | ICD-10-CM

## 2022-10-27 DIAGNOSIS — Z888 Allergy status to other drugs, medicaments and biological substances status: Secondary | ICD-10-CM | POA: Diagnosis not present

## 2022-10-27 DIAGNOSIS — H209 Unspecified iridocyclitis: Secondary | ICD-10-CM | POA: Diagnosis present

## 2022-10-27 LAB — URINALYSIS, ROUTINE W REFLEX MICROSCOPIC
Bilirubin Urine: NEGATIVE
Glucose, UA: >=500 mg/dL — AB
Ketones, ur: 15 mg/dL — AB
Nitrite: NEGATIVE
Protein, ur: NEGATIVE mg/dL
Specific Gravity, Urine: <=1.005 (ref 1.005–1.030)
pH: 5 (ref 5.0–8.0)

## 2022-10-27 LAB — BASIC METABOLIC PANEL
Anion gap: 9 (ref 5–15)
Anion gap: 10 (ref 5–15)
BUN: 35 mg/dL — ABNORMAL HIGH (ref 8–23)
BUN: 32 mg/dL — ABNORMAL HIGH (ref 8–23)
CO2: 23 mmol/L (ref 22–32)
CO2: 25 mmol/L (ref 22–32)
Calcium: 9.3 mg/dL (ref 8.9–10.3)
Calcium: 9.4 mg/dL (ref 8.9–10.3)
Chloride: 95 mmol/L — ABNORMAL LOW (ref 98–111)
Chloride: 97 mmol/L — ABNORMAL LOW (ref 98–111)
Creatinine, Ser: 1.36 mg/dL — ABNORMAL HIGH (ref 0.44–1.00)
Creatinine, Ser: 1.18 mg/dL — ABNORMAL HIGH (ref 0.44–1.00)
GFR, Estimated: 43 mL/min — ABNORMAL LOW (ref >60)
GFR, Estimated: 51 mL/min — ABNORMAL LOW (ref >60)
Glucose, Bld: 726 mg/dL (ref 70–99)
Glucose, Bld: 607 mg/dL (ref 70–99)
Potassium: 5.3 mmol/L — ABNORMAL HIGH (ref 3.5–5.1)
Potassium: 4.8 mmol/L (ref 3.5–5.1)
Sodium: 127 mmol/L — ABNORMAL LOW (ref 135–145)
Sodium: 132 mmol/L — ABNORMAL LOW (ref 135–145)

## 2022-10-27 LAB — CBG MONITORING, ED
Glucose-Capillary: >600 mg/dL (ref 70–99)
Glucose-Capillary: 570 mg/dL (ref 70–99)
Glucose-Capillary: 469 mg/dL — ABNORMAL HIGH (ref 70–99)
Glucose-Capillary: 368 mg/dL — ABNORMAL HIGH (ref 70–99)
Glucose-Capillary: 408 mg/dL — ABNORMAL HIGH (ref 70–99)
Glucose-Capillary: 308 mg/dL — ABNORMAL HIGH (ref 70–99)
Glucose-Capillary: 274 mg/dL — ABNORMAL HIGH (ref 70–99)

## 2022-10-27 LAB — I-STAT VENOUS BLOOD GAS, ED
Acid-Base Excess: 0 mmol/L (ref 0.0–2.0)
Bicarbonate: 23 mmol/L (ref 20.0–28.0)
Calcium, Ion: 1.15 mmol/L (ref 1.15–1.40)
HCT: 40 % (ref 36.0–46.0)
Hemoglobin: 13.6 g/dL (ref 12.0–15.0)
O2 Saturation: 79 %
Patient temperature: 98.8
Potassium: 4.9 mmol/L (ref 3.5–5.1)
Sodium: 131 mmol/L — ABNORMAL LOW (ref 135–145)
TCO2: 24 mmol/L (ref 22–32)
pCO2, Ven: 32.9 mmHg — ABNORMAL LOW (ref 44–60)
pH, Ven: 7.453 — ABNORMAL HIGH (ref 7.25–7.43)
pO2, Ven: 41 mmHg (ref 32–45)

## 2022-10-27 LAB — CBC
HCT: 42.1 % (ref 36.0–46.0)
Hemoglobin: 14.2 g/dL (ref 12.0–15.0)
MCH: 31.1 pg (ref 26.0–34.0)
MCHC: 33.7 g/dL (ref 30.0–36.0)
MCV: 92.1 fL (ref 80.0–100.0)
Platelets: 180 10*3/uL (ref 150–400)
RBC: 4.57 MIL/uL (ref 3.87–5.11)
RDW: 13.5 % (ref 11.5–15.5)
WBC: 9.5 10*3/uL (ref 4.0–10.5)
nRBC: 0 % (ref 0.0–0.2)

## 2022-10-27 LAB — GLUCOSE, CAPILLARY
Glucose-Capillary: 206 mg/dL — ABNORMAL HIGH (ref 70–99)
Glucose-Capillary: 206 mg/dL — ABNORMAL HIGH (ref 70–99)
Glucose-Capillary: 207 mg/dL — ABNORMAL HIGH (ref 70–99)

## 2022-10-27 LAB — URINALYSIS, MICROSCOPIC (REFLEX)

## 2022-10-27 LAB — BETA-HYDROXYBUTYRIC ACID: Beta-Hydroxybutyric Acid: 1.12 mmol/L — ABNORMAL HIGH (ref 0.05–0.27)

## 2022-10-27 LAB — OSMOLALITY: Osmolality: 332 mOsm/kg (ref 275–295)

## 2022-10-27 LAB — MRSA NEXT GEN BY PCR, NASAL: MRSA by PCR Next Gen: NOT DETECTED

## 2022-10-27 MED ORDER — INSULIN REGULAR(HUMAN) IN NACL 100-0.9 UT/100ML-% IV SOLN
INTRAVENOUS | Status: DC
  Administered 2022-10-27: 6 [IU]/h via INTRAVENOUS
  Filled 2022-10-27: qty 100

## 2022-10-27 MED ORDER — TACROLIMUS 1 MG PO CAPS
2.0000 mg | ORAL_CAPSULE | Freq: Two times a day (BID) | ORAL | Status: DC
  Administered 2022-10-28 – 2022-10-29 (×3): 2 mg via ORAL
  Filled 2022-10-27 (×4): qty 2

## 2022-10-27 MED ORDER — ACETAMINOPHEN 325 MG PO TABS
650.0000 mg | ORAL_TABLET | Freq: Once | ORAL | Status: AC
  Administered 2022-10-27: 650 mg via ORAL
  Filled 2022-10-27: qty 2

## 2022-10-27 MED ORDER — ONDANSETRON HCL 4 MG/2ML IJ SOLN: 4.0000 mg | Freq: Four times a day (QID) | INTRAMUSCULAR | Status: DC | PRN

## 2022-10-27 MED ORDER — POTASSIUM CHLORIDE 10 MEQ/100ML IV SOLN: 10.0000 meq | INTRAVENOUS | Status: DC

## 2022-10-27 MED ORDER — DEXTROSE 50 % IV SOLN: 0.0000 mL | INTRAVENOUS | Status: DC | PRN

## 2022-10-27 MED ORDER — ENOXAPARIN SODIUM 40 MG/0.4ML IJ SOSY
40.0000 mg | PREFILLED_SYRINGE | INTRAMUSCULAR | Status: DC
  Administered 2022-10-28 – 2022-10-29 (×2): 40 mg via SUBCUTANEOUS
  Filled 2022-10-27 (×2): qty 0.4

## 2022-10-27 MED ORDER — LACTATED RINGERS IV SOLN: INTRAVENOUS | Status: DC

## 2022-10-27 MED ORDER — DEXTROSE IN LACTATED RINGERS 5 % IV SOLN: INTRAVENOUS | Status: DC

## 2022-10-27 MED ORDER — ORAL CARE MOUTH RINSE: 15.0000 mL | OROMUCOSAL | Status: DC | PRN

## 2022-10-27 MED ORDER — AZATHIOPRINE 50 MG PO TABS
100.0000 mg | ORAL_TABLET | Freq: Every day | ORAL | Status: DC
  Administered 2022-10-28 – 2022-10-29 (×2): 100 mg via ORAL
  Filled 2022-10-27 (×2): qty 2

## 2022-10-27 MED ORDER — LACTATED RINGERS IV BOLUS
1000.0000 mL | Freq: Once | INTRAVENOUS | Status: AC
Start: 2022-10-27 — End: 2022-10-27
  Administered 2022-10-27: 1000 mL via INTRAVENOUS

## 2022-10-27 MED ORDER — CHLORHEXIDINE GLUCONATE CLOTH 2 % EX PADS
6.0000 | MEDICATED_PAD | Freq: Every day | CUTANEOUS | Status: DC
  Administered 2022-10-27 – 2022-10-29 (×3): 6 via TOPICAL

## 2022-10-27 NOTE — Assessment & Plan Note (Signed)
Serum glucose 726 on admission with osmolality 332.  This is a new diagnosis of diabetes for the patient. -Continue insulin infusion -Continue IV fluids as ordered -Repeat BMET now, transition to subq insulin when able -Check A1c

## 2022-10-27 NOTE — H&P (Signed)
History and Physical    Olivia Barnes [id number] DOB: 06/29/1956 DOA: 10/27/2022  PCP: Charleston Poot, MD  Patient coming from: Home  I have personally briefly reviewed patient's old medical records in [place]  Chief Complaint: Dizziness, hyperglycemia  HPI: Olivia Barnes is a 67 y.o. 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  Patient states 2 nights ago she began to feel intermittently dizzy, worse with changing position.  She did not really have any other symptoms.  She went to urgent care today and when her CBG was checked it was unreadable due to being too high.  She was sent to the ED for further evaluation.  Patient denies any known history of diabetes.  Of note she did have blood work obtained on 10/21/2022 which showed a serum glucose of 431.  She was unaware of this result.  She denies any polyuria, polydipsia, nausea, vomiting, abdominal pain, dysuria, diarrhea.  She has not been on any recent steroids.  She has not had any infectious symptoms.  [facility] ED Course  Labs/Imaging on admission: I have personally reviewed following labs and imaging studies.  Initial vitals showed BP 101/84, pulse 81, RR 18, temp 98.8 F, SpO2 96% on room air.  Labs showed serum glucose 726, sodium 127 (142 when corrected for hyperglycemia), potassium 5.3, bicarb 23, anion gap 9, BUN 35, creatinine 1.36, WBC 9.5, hemoglobin 14.2, platelets 180,000.  Beta hydroxybutyrate 1.12.  VBG showed pH 7.453, pCO2 32.9, pO2 41.  UA showed >500 glucose, 15 ketones, negative nitrites, trace leukocytes, 0-5 RBCs, 21-50 WBCs, rare bacteria.  Serum osmolality 332.  Patient was given 1 L LR and started on insulin infusion per protocol.  The hospitalist service was consulted to admit for further  evaluation and management.  Review of Systems: All systems reviewed and are negative except as documented in history of present illness above.   Past Medical History:  Diagnosis Date   Arthritis    Hyperlipemia    Hypertension     Past Surgical History:  Procedure Laterality Date   arm surgery Left    plate in left arm   CYST EXCISION Left    knee    REPLACEMENT TOTAL HIP W/  RESURFACING IMPLANTS Bilateral    TOENAIL EXCISION Left 12/2020   2nd digit    TOTAL KNEE ARTHROPLASTY Left 07/27/2020   Procedure: LEFT TOTAL KNEE ARTHROPLASTY;  Surgeon: Mcarthur Rossetti, MD;  Location: [facility];  Service: Orthopedics;  Laterality: Left;    Social History:  reports that she has never smoked. She has never used smokeless tobacco. She reports that she does not drink alcohol and does not use drugs.  Allergies  Allergen Reactions   Gabapentin Nausea And Vomiting   Meloxicam    Penicillins Rash    Family History  Problem Relation Age of Onset   Heart disease Mother    Healthy Brother      Prior to Admission medications   Medication Sig Start Date End Date Taking? Authorizing Provider  Adalimumab (HUMIRA) 40 MG/0.4ML PSKT Inject 40 mg into the skin every 14 (fourteen) days.     [provider]  bimatoprost (LUMIGAN) 0.03 % ophthalmic solution Place 1 drop into both eyes at bedtime.     [provider]  COMBIGAN 0.2-0.5 % ophthalmic solution Place 1 drop into both eyes 2 (  two) times daily.  03/26/20   [provider]  losartan-hydrochlorothiazide (HYZAAR) 100-12.5 MG tablet Take 1 tablet by mouth daily. 03/06/20   [provider]  Multiple Vitamin (MULTIVITAMIN PO) Take 1 tablet by mouth daily.     [provider]  mycophenolate (CELLCEPT) 500 MG tablet Take 1,500 mg by mouth 2 (two) times daily.     [provider]  PENNSAID 2 % SOLN APPLY 2 PUMPS TOPICALLY TO THE AFFECTED AREA(S) TWICE DAILY AS DIRECTED 02/12/21   Lyndal Pulley, DO  polyethylene glycol powder (GLYCOLAX/MIRALAX) 17 GM/SCOOP powder Take 1 Container by mouth daily.     [provider]  rosuvastatin (CRESTOR) 20 MG tablet Take 20 mg by mouth daily. 03/06/20   [provider]  tacrolimus (PROGRAF) 1 MG capsule Take 2 mg by mouth 2 (two) times daily.     [provider]  terbinafine (LAMISIL) 250 MG tablet Take 250 mg by mouth daily. 09/06/20   [provider]  tiZANidine (ZANAFLEX) 2 MG tablet TAKE 1 TABLET BY MOUTH EVERYDAY AT BEDTIME 12/25/21   Lyndal Pulley, DO    Physical Exam: Vitals:   10/27/22 2057 10/27/22 2100 10/27/22 2209 10/27/22 2210  BP: 114/78 137/88 135/83   Pulse: 65 62  64  Resp: (!) '8 12  15  '$ Temp: 98.3 F (36.8 C)     TempSrc: Oral     SpO2: 100% 100%  100%  Weight:      Height:       Constitutional: Resting in bed, NAD, calm, comfortable Eyes: EOMI, lids and conjunctivae normal ENMT: Mucous membranes are moist. Posterior pharynx clear of any exudate or lesions.Normal dentition.  Neck: normal, supple, no masses. Respiratory: clear to auscultation bilaterally, no wheezing, no crackles. Normal respiratory effort. No accessory muscle use.  Cardiovascular: Regular rate and rhythm, no murmurs / rubs / gallops. No extremity edema. 2+ pedal pulses. Abdomen: no tenderness, no masses palpated.  Musculoskeletal: no clubbing / cyanosis. No joint deformity upper and lower extremities. Good ROM, no contractures. Normal muscle tone.  Skin: no rashes, lesions, ulcers. No induration Neurologic: Sensation intact. Strength 5/5 in all 4.  Psychiatric: Alert and oriented x 3. Normal mood.   EKG: Personally reviewed. Sinus rhythm, rate 77, no acute ischemic changes.  Assessment/Plan Principal Problem:   Hyperosmolar hyperglycemic state (HHS) ([place]) Active Problems:   AKI (acute kidney injury) ([place])   Hyperkalemia   Essential hypertension   Uveitis   Olivia Barnes is a 67 y.o. 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  Assessment and Plan: * Hyperosmolar hyperglycemic state (HHS) ([place]) Serum glucose 726 on admission with osmolality 332.  This is a new diagnosis of diabetes for the patient. -Continue insulin infusion -Continue IV fluids as ordered -Repeat BMET now, transition to subq insulin when able -Check A1c  Hyperkalemia Mild in setting of AKI and hypovolemia.  Resolved with IV fluids.  AKI (acute kidney injury) ([place]) In setting of HHS with volume depletion.  Renal function improving on repeat labs.  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  Follows with [facility] ophthalmology.  Will continue her usual meds with note that Prograf can potentially exacerbate hyperglycemia.  She confirms she is no longer taking CellCept, I have discontinued it from her med list. -Continue Imuran 100 mg daily -  Continue Prograf 2 mg twice daily -Resume Humira as an outpatient  Essential hypertension BP is currently stable.  Losartan-HCTZ on hold given AKI.  DVT prophylaxis: enoxaparin (LOVENOX) injection 40 mg Start: 10/28/22 1000 Code Status: Full code, confirmed with patient on admission Family Communication: Discussed with patient, she has discussed with family Disposition Plan: From home and likely discharge to home pending clinical progress Consults called: None Severity of Illness: The appropriate patient status for this patient is INPATIENT. Inpatient status is judged to be reasonable and necessary in order to provide the required intensity of service to ensure the patient's safety. The patient's presenting symptoms, physical exam findings, and initial radiographic and laboratory data in the context of their chronic  comorbidities is felt to place them at high risk for further clinical deterioration. Furthermore, it is not anticipated that the patient will be medically stable for discharge from the hospital within 2 midnights of admission.   * I certify that at the point of admission it is my clinical judgment that the patient will require inpatient hospital care spanning beyond 2 midnights from the point of admission due to high intensity of service, high risk for further deterioration and high frequency of surveillance required.Zada Finders MD [facility]  If 7PM-7AM, please contact night-coverage [email or web address]  10/27/2022, 11:13 PM

## 2022-10-27 NOTE — Hospital Course (Signed)
Olivia Barnes is a 67 y.o. 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

## 2022-10-27 NOTE — ED Triage Notes (Signed)
Pt states went to UC for dizziness since yesterday  and was told CBG was elevated and "couldn't get a reading" Had omlet, grits and fruit at 1030 prior to UC appointment  No h/o diabetes   CBG HI at triage

## 2022-10-27 NOTE — ED Notes (Signed)
ED TO INPATIENT HANDOFF REPORT  ED Nurse Name and Phone #: Salvatore Decent Name/Age/Gender Olivia Barnes 67 y.o. female Room/Bed: MH11/MH11  Code Status   Code Status: Prior  Home/SNF/Other Home Patient oriented to: self, place, time, and situation Is this baseline? Yes   Triage Complete: Triage complete  Chief Complaint DKA (diabetic ketoacidosis) ([place]) [E11.10]  Triage Note Pt states went to UC for dizziness since yesterday  and was told CBG was elevated and "couldn't get a reading" Had omlet, grits and fruit at 1030 prior to UC appointment  No h/o diabetes   CBG HI at triage    Allergies Allergies  Allergen Reactions   Gabapentin Nausea And Vomiting   Meloxicam    Penicillins Rash    Level of Care/Admitting Diagnosis ED Disposition     ED Disposition  [place]: [place] [[id number]]  Level of Care: Stepdown [14]  Admit to SDU based on following criteria: Hemodynamic compromise or significant risk of instability:  Patient requiring short term acute titration and management of vasoactive drips, and invasive monitoring (i.e., CVP and Arterial line).  Admit to SDU based on following criteria: Severe physiological/psychological symptoms:  Any diagnosis requiring assessment & intervention at least every 4 hours on an ongoing basis to obtain desired patient outcomes including stability and rehabilitation  May [facility] patient to [facility] or [facility] if equivalent level of care is available:: No  Interfacility transfer: Yes  Covid Evaluation: Confirmed COVID Negative  Diagnosis: DKA (diabetic ketoacidosis) [facility]) [id number]  Admitting Physician: Nita Sells [phone]  Attending Physician: Nita Sells [id number]  Certification:: I certify this patient will need inpatient services for at least 2 midnights  Estimated Length of Stay: 2          B Medical/Surgery History Past Medical History:   Diagnosis Date   Arthritis    Hyperlipemia    Hypertension    Past Surgical History:  Procedure Laterality Date   arm surgery Left    plate in left arm   CYST EXCISION Left    knee    REPLACEMENT TOTAL HIP W/  RESURFACING IMPLANTS Bilateral    TOENAIL EXCISION Left 12/2020   2nd digit    TOTAL KNEE ARTHROPLASTY Left 07/27/2020   Procedure: LEFT TOTAL KNEE ARTHROPLASTY;  Surgeon: Mcarthur Rossetti, MD;  Location: [facility];  Service: Orthopedics;  Laterality: Left;     A IV Location/Drains/Wounds Patient Lines/Drains/Airways Status     Active Line/Drains/Airways     Name Placement date Placement time Site Days   Peripheral IV 10/27/22 18 G Right Antecubital 10/27/22  1425  Antecubital  less than 1   Incision (Closed) 07/27/20 Knee Left 07/27/20  1143  -- 822            Intake/Output Last 24 hours  Intake/Output Summary (Last 24 hours) at 10/27/2022 1734 Last data filed at 10/27/2022 1613 Gross per 24 hour  Intake 1000 ml  Output --  Net 1000 ml    Labs/Imaging Results for orders placed or performed during the hospital encounter of 10/27/22 (from the past 48 hour(s))  POC CBG, ED     Status: Abnormal   Collection Time: 10/27/22  2:20 PM  Result Value Ref Range   Glucose-Capillary >600 (HH) 70 - 99 mg/dL    Comment: Glucose reference range applies only to samples taken after fasting for at least 8 hours.   Comment 1 Notify RN  Basic metabolic panel     Status: Abnormal   Collection Time: 10/27/22  2:30 PM  Result Value Ref Range   Sodium 127 (L) 135 - 145 mmol/L   Potassium 5.3 (H) 3.5 - 5.1 mmol/L   Chloride 95 (L) 98 - 111 mmol/L   CO2 23 22 - 32 mmol/L   Glucose, Bld 726 (HH) 70 - 99 mg/dL    Comment: CRITICAL RESULT CALLED TO, READ BACK BY AND VERIFIED WITH MARVA SIMMS RN AT 1529 ON 10/27/22 BY I.SUGUT Glucose reference range applies only to samples taken after fasting for at least 8 hours.    BUN 35 (H) 8 - 23 mg/dL   Creatinine, Ser 1.36 (H) 0.44 -  1.00 mg/dL   Calcium 9.3 8.9 - 10.3 mg/dL   GFR, Estimated 43 (L) >60 mL/min    Comment: (NOTE) Calculated using the CKD-EPI Creatinine Equation (2021)    Anion gap 9 5 - 15    Comment: Performed at [facility], [address] [place], [place] [address]  CBC     Status: None   Collection Time: 10/27/22  2:30 PM  Result Value Ref Range   WBC 9.5 4.0 - 10.5 K/uL   RBC 4.57 3.87 - 5.11 MIL/uL   Hemoglobin 14.2 12.0 - 15.0 g/dL   HCT 42.1 36.0 - 46.0 %   MCV 92.1 80.0 - 100.0 fL   MCH 31.1 26.0 - 34.0 pg   MCHC 33.7 30.0 - 36.0 g/dL   RDW 13.5 11.5 - 15.5 %   Platelets 180 150 - 400 K/uL   nRBC 0.0 0.0 - 0.2 %    Comment: Performed at [facility], [address] [place], [place] [address]  I-Stat venous blood gas, ED     Status: Abnormal   Collection Time: 10/27/22  2:49 PM  Result Value Ref Range   pH, Ven 7.453 (H) 7.25 - 7.43   pCO2, Ven 32.9 (L) 44 - 60 mmHg   pO2, Ven 41 32 - 45 mmHg   Bicarbonate 23.0 20.0 - 28.0 mmol/L   TCO2 24 22 - 32 mmol/L   O2 Saturation 79 %   Acid-Base Excess 0.0 0.0 - 2.0 mmol/L   Sodium 131 (L) 135 - 145 mmol/L   Potassium 4.9 3.5 - 5.1 mmol/L   Calcium, Ion 1.15 1.15 - 1.40 mmol/L   HCT 40.0 36.0 - 46.0 %   Hemoglobin 13.6 12.0 - 15.0 g/dL   Patient temperature 98.8 F    Collection site IV start    Drawn by Nurse    Sample type VENOUS   Urinalysis, Routine w reflex microscopic -Urine, Clean Catch     Status: Abnormal   Collection Time: 10/27/22  2:59 PM  Result Value Ref Range   Color, Urine YELLOW YELLOW   APPearance CLOUDY (A) CLEAR   Specific Gravity, Urine <=1.005 1.005 - 1.030   pH 5.0 5.0 - 8.0   Glucose, UA >=500 (A) NEGATIVE mg/dL   Hgb urine dipstick TRACE (A) NEGATIVE   Bilirubin Urine NEGATIVE NEGATIVE   Ketones, ur 15 (A) NEGATIVE mg/dL   Protein, ur NEGATIVE NEGATIVE mg/dL   Nitrite NEGATIVE NEGATIVE   Leukocytes,Ua TRACE (A) NEGATIVE    Comment: Performed at [facility], [address] [place], [place] [address]  Urinalysis, Microscopic (reflex)     Status: Abnormal   Collection Time: 10/27/22  2:59 PM  Result Value Ref Range  RBC / HPF 0-5 0 - 5 RBC/hpf   WBC, UA 21-50 0 - 5 WBC/hpf   Bacteria, UA RARE (A) NONE SEEN   Squamous Epithelial / HPF 0-5 0 - 5 /HPF    Comment: Performed at [facility], [address] [place], [place] [id number]  Basic metabolic panel     Status: Abnormal   Collection Time: 10/27/22  4:15 PM  Result Value Ref Range   Sodium 132 (L) 135 - 145 mmol/L   Potassium 4.8 3.5 - 5.1 mmol/L   Chloride 97 (L) 98 - 111 mmol/L   CO2 25 22 - 32 mmol/L   Glucose, Bld 607 (HH) 70 - 99 mg/dL    Comment: CRITICAL RESULT CALLED TO, READ BACK BY AND VERIFIED WITH MARVA SIMMS RN AT 1701 ON 11/06/22 BY I.SUGUT Glucose reference range applies only to samples taken after fasting for at least 8 hours.    BUN 32 (H) 8 - 23 mg/dL   Creatinine, Ser 1.18 (H) 0.44 - 1.00 mg/dL   Calcium 9.4 8.9 - 10.3 mg/dL   GFR, Estimated 51 (L) >60 mL/min    Comment: (NOTE) Calculated using the CKD-EPI Creatinine Equation (2021)    Anion gap 10 5 - 15    Comment: Performed at [facility], [address] [place], [place] [address]  CBG monitoring, ED     Status: Abnormal   Collection Time: 10/27/22  4:18 PM  Result Value Ref Range   Glucose-Capillary 570 (HH) 70 - 99 mg/dL    Comment: Glucose reference range applies only to samples taken after fasting for at least 8 hours.  CBG monitoring, ED     Status: Abnormal   Collection Time: 10/27/22  4:54 PM  Result Value Ref Range   Glucose-Capillary 469 (H) 70 - 99 mg/dL    Comment: Glucose reference range applies only to samples taken after fasting for at least 8 hours.   No results found.  Pending Labs Unresulted Labs (From admission, onward)     Start     Ordered   10/27/22 1513  Osmolality  (Hyperglycemic Hyperosmolar State (HHS))  Once,   URGENT        10/27/22 1519   10/27/22 1435   Beta-hydroxybutyric acid  Once,   URGENT        10/27/22 1434            Vitals/Pain Today's Vitals   10/27/22 1645 10/27/22 1715 10/27/22 1730 10/27/22 1733  BP: (!) 169/75 (!) 153/79 (!) 157/76 (!) 157/76  Pulse: 68 74 77 75  Resp: '13 15 14 18  '$ Temp:    98.5 F (36.9 C)  TempSrc:    Oral  SpO2: 99% 98% 96% 98%  Weight:      Height:      PainSc:    0-No pain    Isolation Precautions No active isolations  Medications Medications  insulin regular, human (MYXREDLIN) 100 units/ 100 mL infusion (4 Units/hr Intravenous Rate/Dose Change 10/27/22 1732)  dextrose 5 % in lactated ringers infusion (has no administration in time range)  dextrose 50 % solution 0-50 mL (has no administration in time range)  lactated ringers infusion ( Intravenous New Bag/Given 10/27/22 1615)  lactated ringers bolus 1,000 mL (0 mLs Intravenous Stopped 10/27/22 1613)    Mobility walks        R Recommendations: See Admitting Provider Note  Report given to:   Additional Notes:

## 2022-10-27 NOTE — ED Provider Notes (Signed)
[place] [facility] AT [facility] Provider Note   CSN: [id number] Arrival date & time: 10/27/22  1409     History  Chief Complaint  Patient presents with   Hyperglycemia   HPI Olivia Barnes is a 67 y.o. female with hypertension, hyperlipidemia and arthritis presenting for hyperglycemia.  States she was being seen earlier today by urgent care for dizziness and was found to have an unreadable blood glucose.  To her knowledge has no history of diabetes.  Not taking any diabetic related medications.  Dizziness started Saturday night.  Patient was in a seated position attempted to stand up and was too dizzy.  Felt more like loss of balance.  Denies room spinning sensation.  States dizziness has been constant since Saturday night but has waxed and waned in its intensity.  States at this moment it has improved.  Denies visual disturbance, headache.  Denies chest pain shortness of breath.  Denies fever, cough or congestion.   Hyperglycemia Associated symptoms: dizziness        Home Medications Prior to Admission medications   Medication Sig Start Date End Date Taking? Authorizing Provider  Adalimumab (HUMIRA) 40 MG/0.4ML PSKT Inject 40 mg into the skin every 14 (fourteen) days.     [provider]  bimatoprost (LUMIGAN) 0.03 % ophthalmic solution Place 1 drop into both eyes at bedtime.     [provider]  COMBIGAN 0.2-0.5 % ophthalmic solution Place 1 drop into both eyes 2 (two) times daily.  03/26/20   [provider]  losartan-hydrochlorothiazide (HYZAAR) 100-12.5 MG tablet Take 1 tablet by mouth daily. 03/06/20   [provider]  Multiple Vitamin (MULTIVITAMIN PO) Take 1 tablet by mouth daily.     [provider]  mycophenolate (CELLCEPT) 500 MG tablet Take 1,500 mg by mouth 2 (two) times daily.     [provider]  PENNSAID 2 % SOLN APPLY 2 PUMPS TOPICALLY TO THE AFFECTED AREA(S) TWICE DAILY AS DIRECTED 02/12/21    Lyndal Pulley, DO  polyethylene glycol powder (GLYCOLAX/MIRALAX) 17 GM/SCOOP powder Take 1 Container by mouth daily.     [provider]  rosuvastatin (CRESTOR) 20 MG tablet Take 20 mg by mouth daily. 03/06/20   [provider]  tacrolimus (PROGRAF) 1 MG capsule Take 2 mg by mouth 2 (two) times daily.     [provider]  terbinafine (LAMISIL) 250 MG tablet Take 250 mg by mouth daily. 09/06/20   [provider]  tiZANidine (ZANAFLEX) 2 MG tablet TAKE 1 TABLET BY MOUTH EVERYDAY AT BEDTIME 12/25/21   Lyndal Pulley, DO      Allergies    Gabapentin, Meloxicam, and Penicillins    Review of Systems   Review of Systems  Neurological:  Positive for dizziness.    Physical Exam   Vitals:   10/27/22 1730 10/27/22 1733  BP: (!) 157/76 (!) 157/76  Pulse: 77 75  Resp: 14 18  Temp:  98.5 F (36.9 C)  SpO2: 96% 98%    CONSTITUTIONAL:  well-appearing, NAD NEURO:  GCS 15. Speech is goal oriented. No deficits appreciated to CN III-XII; symmetric eyebrow raise, no facial drooping, tongue midline. Patient has equal grip strength bilaterally with 5/5 strength against resistance in all major muscle groups bilaterally. Sensation to light touch intact. Patient moves extremities without ataxia. Normal finger-nose-finger. Patient refused gait assessment due to dizziness. EYES:  eyes equal and reactive ENT/NECK:  Supple, no stridor  CARDIO:  Regular rate and rhythm,  appears well-perfused  PULM:  No respiratory distress, CTAB GI/GU:  non-distended, soft, non tender MSK/SPINE:  No gross deformities, no edema, moves all extremities  SKIN:  no rash, atraumatic  *Additional and/or pertinent findings included in MDM below  ED Results / Procedures / Treatments   Labs (all labs ordered are listed, but only abnormal results are displayed) Labs Reviewed  BASIC METABOLIC PANEL - Abnormal; Notable for the following components:      Result Value   Sodium 127 (*)     Potassium 5.3 (*)    Chloride 95 (*)    Glucose, Bld 726 (*)    BUN 35 (*)    Creatinine, Ser 1.36 (*)    GFR, Estimated 43 (*)    All other components within normal limits  URINALYSIS, ROUTINE W REFLEX MICROSCOPIC - Abnormal; Notable for the following components:   APPearance CLOUDY (*)    Glucose, UA >=500 (*)    Hgb urine dipstick TRACE (*)    Ketones, ur 15 (*)    Leukocytes,Ua TRACE (*)    All other components within normal limits  BETA-HYDROXYBUTYRIC ACID - Abnormal; Notable for the following components:   Beta-Hydroxybutyric Acid 1.12 (*)    All other components within normal limits  URINALYSIS, MICROSCOPIC (REFLEX) - Abnormal; Notable for the following components:   Bacteria, UA RARE (*)    All other components within normal limits  BASIC METABOLIC PANEL - Abnormal; Notable for the following components:   Sodium 132 (*)    Chloride 97 (*)    Glucose, Bld 607 (*)    BUN 32 (*)    Creatinine, Ser 1.18 (*)    GFR, Estimated 51 (*)    All other components within normal limits  CBG MONITORING, ED - Abnormal; Notable for the following components:   Glucose-Capillary >600 (*)    All other components within normal limits  CBG MONITORING, ED - Abnormal; Notable for the following components:   Glucose-Capillary 570 (*)    All other components within normal limits  I-STAT VENOUS BLOOD GAS, ED - Abnormal; Notable for the following components:   pH, Ven 7.453 (*)    pCO2, Ven 32.9 (*)    Sodium 131 (*)    All other components within normal limits  CBG MONITORING, ED - Abnormal; Notable for the following components:   Glucose-Capillary 469 (*)    All other components within normal limits  CBG MONITORING, ED - Abnormal; Notable for the following components:   Glucose-Capillary 408 (*)    All other components within normal limits  CBG MONITORING, ED - Abnormal; Notable for the following components:   Glucose-Capillary 368 (*)    All other components within normal limits  CBC   OSMOLALITY    EKG EKG Interpretation  Date/Time:  Monday October 27 2022 16:07:38 EST Ventricular Rate:  77 PR Interval:  181 QRS Duration: 84 QT Interval:  384 QTC Calculation: 435 R Axis:   32 Text Interpretation: Sinus rhythm no acute ST/T changes No old tracing to compare Confirmed by Sherwood Gambler [phone]) on 10/27/2022 4:21:43 PM  Radiology No results found.  Procedures Procedures    Medications Ordered in ED Medications  insulin regular, human (MYXREDLIN) 100 units/ 100 mL infusion (4 Units/hr Intravenous Rate/Dose Change 10/27/22 1732)  dextrose 5 % in lactated ringers infusion (has no administration in time range)  dextrose 50 % solution 0-50 mL (has no administration in time range)  lactated ringers infusion ( Intravenous New Bag/Given 10/27/22 1615)  lactated ringers bolus 1,000 mL (0 mLs Intravenous Stopped 10/27/22 1613)    ED Course/ Medical Decision Making/ A&P                             Medical Decision Making Amount and/or Complexity of Data Reviewed Labs: ordered.  Risk Prescription drug management. Decision regarding hospitalization.   Initial Impression and Ddx 67 year old female who is well-appearing and hemodynamically stable with initial glucose greater than 600 presenting for hypoglycemia.  Exam unremarkable.  DDx includes DKA, HHS, hyperglycemia, sepsis, and dehydration. Patient PMH that increases complexity of ED encounter: Hypertension, hyperlipidemia  Interpretation of Diagnostics - I independent reviewed and interpreted the labs as followed: Hyperglycemia, hyponatremia, elevated creatinine and BUN, elevated beta hydroxybutyrate  -I personally reviewed and interpreted EKG which revealed normal sinus rhythm.  Patient Reassessment and Ultimate Disposition/Management Treated her hypoglycemia per protocol with regular insulin and volume resuscitated with lactated ringer bolus.  Spoke to Dr. Verlon Au who agreed to admit her for continued  evaluation for hypoglycemia.  PT transferred to [facility] for further evaluation and management.  Patient management required discussion with the following services or consulting groups:  Hospitalist Service  Complexity of Problems Addressed Acute complicated illness or Injury  Additional Data Reviewed and Analyzed Further history obtained from: Further history from spouse/family member and Past medical history and medications listed in the EMR  Patient Encounter Risk Assessment Consideration of hospitalization         Final Clinical Impression(s) / ED Diagnoses Final diagnoses:  Hyperglycemia    Rx / DC Orders ED Discharge Orders     None         Harriet Pho, PA-C 10/27/22 [place], [place], DO 10/28/22 [phone]

## 2022-10-27 NOTE — Assessment & Plan Note (Signed)
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  Follows with [place] ophthalmology.  Will continue her usual meds with note that Prograf can potentially exacerbate hyperglycemia.  She confirms she is no longer taking CellCept, I have discontinued it from her med list. -Continue Imuran 100 mg daily -Continue Prograf 2 mg twice daily -Resume Humira as an outpatient

## 2022-10-27 NOTE — Assessment & Plan Note (Signed)
In setting of HHS with volume depletion.  Renal function improving on repeat labs.

## 2022-10-27 NOTE — Assessment & Plan Note (Signed)
BP is currently stable.  Losartan-HCTZ on hold given AKI.

## 2022-10-27 NOTE — Assessment & Plan Note (Signed)
Mild in setting of AKI and hypovolemia.  Resolved with IV fluids.

## 2022-10-27 NOTE — ED Notes (Signed)
Pt in bed, pt reports headache, md notified, md to eval pt

## 2022-10-28 DIAGNOSIS — E1100 Type 2 diabetes mellitus with hyperosmolarity without nonketotic hyperglycemic-hyperosmolar coma (NKHHC): Secondary | ICD-10-CM | POA: Diagnosis not present

## 2022-10-28 LAB — BASIC METABOLIC PANEL
Anion gap: 4 — ABNORMAL LOW (ref 5–15)
Anion gap: 6 (ref 5–15)
Anion gap: 5 (ref 5–15)
Anion gap: 5 (ref 5–15)
BUN: 30 mg/dL — ABNORMAL HIGH (ref 8–23)
BUN: 27 mg/dL — ABNORMAL HIGH (ref 8–23)
BUN: 25 mg/dL — ABNORMAL HIGH (ref 8–23)
BUN: 19 mg/dL (ref 8–23)
CO2: 26 mmol/L (ref 22–32)
CO2: 27 mmol/L (ref 22–32)
CO2: 27 mmol/L (ref 22–32)
CO2: 28 mmol/L (ref 22–32)
Calcium: 8.9 mg/dL (ref 8.9–10.3)
Calcium: 9.3 mg/dL (ref 8.9–10.3)
Calcium: 8.9 mg/dL (ref 8.9–10.3)
Calcium: 8.9 mg/dL (ref 8.9–10.3)
Chloride: 105 mmol/L (ref 98–111)
Chloride: 106 mmol/L (ref 98–111)
Chloride: 106 mmol/L (ref 98–111)
Chloride: 104 mmol/L (ref 98–111)
Creatinine, Ser: 1 mg/dL (ref 0.44–1.00)
Creatinine, Ser: 0.98 mg/dL (ref 0.44–1.00)
Creatinine, Ser: 0.86 mg/dL (ref 0.44–1.00)
Creatinine, Ser: 0.89 mg/dL (ref 0.44–1.00)
GFR, Estimated: >60 mL/min (ref >60)
GFR, Estimated: >60 mL/min (ref >60)
GFR, Estimated: >60 mL/min (ref >60)
GFR, Estimated: >60 mL/min (ref >60)
Glucose, Bld: 228 mg/dL — ABNORMAL HIGH (ref 70–99)
Glucose, Bld: 204 mg/dL — ABNORMAL HIGH (ref 70–99)
Glucose, Bld: 229 mg/dL — ABNORMAL HIGH (ref 70–99)
Glucose, Bld: 284 mg/dL — ABNORMAL HIGH (ref 70–99)
Potassium: 4.2 mmol/L (ref 3.5–5.1)
Potassium: 4.6 mmol/L (ref 3.5–5.1)
Potassium: 4.1 mmol/L (ref 3.5–5.1)
Potassium: 4.1 mmol/L (ref 3.5–5.1)
Sodium: 135 mmol/L (ref 135–145)
Sodium: 139 mmol/L (ref 135–145)
Sodium: 138 mmol/L (ref 135–145)
Sodium: 137 mmol/L (ref 135–145)

## 2022-10-28 LAB — GLUCOSE, CAPILLARY
Glucose-Capillary: 159 mg/dL — ABNORMAL HIGH (ref 70–99)
Glucose-Capillary: 177 mg/dL — ABNORMAL HIGH (ref 70–99)
Glucose-Capillary: 179 mg/dL — ABNORMAL HIGH (ref 70–99)
Glucose-Capillary: 198 mg/dL — ABNORMAL HIGH (ref 70–99)
Glucose-Capillary: 198 mg/dL — ABNORMAL HIGH (ref 70–99)
Glucose-Capillary: 203 mg/dL — ABNORMAL HIGH (ref 70–99)
Glucose-Capillary: 216 mg/dL — ABNORMAL HIGH (ref 70–99)
Glucose-Capillary: 221 mg/dL — ABNORMAL HIGH (ref 70–99)
Glucose-Capillary: 221 mg/dL — ABNORMAL HIGH (ref 70–99)
Glucose-Capillary: 228 mg/dL — ABNORMAL HIGH (ref 70–99)
Glucose-Capillary: 247 mg/dL — ABNORMAL HIGH (ref 70–99)
Glucose-Capillary: 252 mg/dL — ABNORMAL HIGH (ref 70–99)
Glucose-Capillary: 253 mg/dL — ABNORMAL HIGH (ref 70–99)
Glucose-Capillary: 327 mg/dL — ABNORMAL HIGH (ref 70–99)

## 2022-10-28 LAB — HIV ANTIBODY (ROUTINE TESTING W REFLEX): HIV Screen 4th Generation wRfx: NONREACTIVE

## 2022-10-28 LAB — BETA-HYDROXYBUTYRIC ACID: Beta-Hydroxybutyric Acid: 0.09 mmol/L (ref 0.05–0.27)

## 2022-10-28 MED ORDER — LOSARTAN POTASSIUM 50 MG PO TABS
50.0000 mg | ORAL_TABLET | Freq: Every day | ORAL | Status: DC
  Administered 2022-10-28 – 2022-10-29 (×2): 50 mg via ORAL
  Filled 2022-10-28 (×2): qty 1

## 2022-10-28 MED ORDER — HYDRALAZINE HCL 25 MG PO TABS: 25.0000 mg | ORAL_TABLET | Freq: Four times a day (QID) | ORAL | Status: DC | PRN

## 2022-10-28 MED ORDER — VALACYCLOVIR HCL 500 MG PO TABS
1000.0000 mg | ORAL_TABLET | Freq: Three times a day (TID) | ORAL | Status: DC
  Administered 2022-10-28 – 2022-10-29 (×2): 1000 mg via ORAL
  Filled 2022-10-28 (×2): qty 2

## 2022-10-28 MED ORDER — INSULIN STARTER KIT- PEN NEEDLES (ENGLISH)
1.0000 | Freq: Once | Status: AC
  Administered 2022-10-28: 1
  Filled 2022-10-28: qty 1

## 2022-10-28 MED ORDER — INSULIN ASPART 100 UNIT/ML IJ SOLN
0.0000 [IU] | Freq: Every day | INTRAMUSCULAR | Status: DC
  Administered 2022-10-28: 4 [IU] via SUBCUTANEOUS

## 2022-10-28 MED ORDER — ACETAMINOPHEN 325 MG PO TABS
650.0000 mg | ORAL_TABLET | Freq: Once | ORAL | Status: AC | PRN
  Administered 2022-10-28: 650 mg via ORAL

## 2022-10-28 MED ORDER — ACETAMINOPHEN 325 MG PO TABS
650.0000 mg | ORAL_TABLET | Freq: Once | ORAL | Status: AC | PRN
  Administered 2022-10-28: 650 mg via ORAL
  Filled 2022-10-28: qty 2

## 2022-10-28 MED ORDER — TRAMADOL HCL 50 MG PO TABS: 50.0000 mg | ORAL_TABLET | Freq: Four times a day (QID) | ORAL | Status: DC | PRN

## 2022-10-28 MED ORDER — INSULIN ASPART 100 UNIT/ML IJ SOLN
3.0000 [IU] | Freq: Three times a day (TID) | INTRAMUSCULAR | Status: DC
  Administered 2022-10-28: 3 [IU] via SUBCUTANEOUS

## 2022-10-28 MED ORDER — LIVING WELL WITH DIABETES BOOK
Freq: Once | Status: AC
  Filled 2022-10-28: qty 1

## 2022-10-28 MED ORDER — INSULIN GLARGINE-YFGN 100 UNIT/ML ~~LOC~~ SOLN
15.0000 [IU] | Freq: Every day | SUBCUTANEOUS | Status: DC
  Administered 2022-10-28: 15 [IU] via SUBCUTANEOUS
  Filled 2022-10-28 (×2): qty 0.15

## 2022-10-28 MED ORDER — ACETAMINOPHEN 325 MG PO TABS: 650.0000 mg | ORAL_TABLET | Freq: Four times a day (QID) | ORAL | Status: DC | PRN

## 2022-10-28 MED ORDER — ATORVASTATIN CALCIUM 40 MG PO TABS
40.0000 mg | ORAL_TABLET | Freq: Every day | ORAL | Status: DC
  Administered 2022-10-29: 40 mg via ORAL
  Filled 2022-10-28: qty 1

## 2022-10-28 MED ORDER — INSULIN ASPART 100 UNIT/ML IJ SOLN
0.0000 [IU] | Freq: Three times a day (TID) | INTRAMUSCULAR | Status: DC
  Administered 2022-10-28: 3 [IU] via SUBCUTANEOUS
  Administered 2022-10-28: 5 [IU] via SUBCUTANEOUS
  Administered 2022-10-28 – 2022-10-29 (×2): 8 [IU] via SUBCUTANEOUS
  Administered 2022-10-29: 5 [IU] via SUBCUTANEOUS

## 2022-10-28 NOTE — Progress Notes (Signed)
Message left with on call Infection Prevention.  Oncoming charge nurse made aware as well as AC made aware for possible need for negative pressure patient room.    Virginia Rochester, RN [place] Nurse

## 2022-10-28 NOTE — Progress Notes (Signed)
Educated patient on proper cleansing and administration of insulin to self (patient was shown via her subQ handout the specific subQ administration sites and verbalized her understanding of this). Patient was walked through each step of administering insulin, patient then verbalized the steps back to this nurse; patient cleansed and administered scheduled 11u Novalog to lt lower abd without complication or questions.

## 2022-10-28 NOTE — Plan of Care (Signed)
Nutrition Education Note  RD consulted for nutrition education regarding diabetes.   No results found for: "HGBA1C"  *Still pending  Patient reports food recall as below: Breakfast: Special K cereal with milk or yogurt with fruit and a boiled egg Lunch: [place] or ham sandwich with chips Dinner: varies, home cooked meal - often porkchops, fish, hamburgers Drinks: pepsi, cranberry-mango juice, lemonade  RD provided "Carbohydrate Counting for People with Diabetes", "Plate Method for Diabetes" and "Diabetes Nutrition Label Reading Tips". handouts from the Academy of Nutrition and Dietetics. Discussed different food groups and their effects on blood sugar, emphasizing carbohydrate-containing foods. Provided list of carbohydrates and recommended serving sizes of common foods.  Discussed importance of controlled and consistent carbohydrate intake throughout the day. Provided examples of ways to balance meals/snacks and encouraged intake of high-fiber, whole grain complex carbohydrates. Encouraged cutting back on sugar sweetened beverages. Addressed all questions and concerns from patient. Teach back method used. Expect fair compliance.  Body mass index is 39.86 kg/m. 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  Current diet order is Carb Modified. Labs and medications reviewed. No further nutrition interventions warranted at this time. Handouts added to AVS. RD contact information provided. If additional nutrition issues arise, please re-consult RD.  Samson Frederic RD, LDN For contact information, refer to [facility].

## 2022-10-28 NOTE — Progress Notes (Signed)
As this nurse was transporting the patient to room 1514 from the ICU, the patient stated she had a spot on her buttock that was itching/concerning to her. This nurse informed Dr. Bonner Puna of this concern and passed the information on to the ongoing nurse, Lenna Sciara, RN. Dr. Bonner Puna informed this nurse that he will try to make it to the patient's bedside to assess the skin concern.

## 2022-10-28 NOTE — Progress Notes (Signed)
[facility] PROGRESS NOTE  Olivia Barnes (DOB: 1956/04/10) [id number] PCP: Charleston Poot, MD Outpatient Specialists: Ophthalmology Dr. Manuella Ghazi, Sports Medicine Dr. Tamala Julian  Brief Narrative: Olivia Barnes is a 67 y.o. 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 Insulin and IV fluid infusions initiated and the patient was admitted.  Subjective: Having global headache starting this morning not responsive to tylenol. Her BP is also severely elevated. She denies previous or current blurry vision, weakness, numbness, photophobia, polyuria, polydipsia.   Objective: BP (!) 176/66 (BP Location: Left Arm)   Pulse (!) 54   Temp 97.9 F (36.6 C) (Oral)   Resp 11   Ht '5\' 3"'$  (1.6 m)   Wt 102.1 kg   SpO2 99%   BMI 39.86 kg/m   Gen: Pleasant nontoxic female laying quietly Pulm: Clear, nonlabored  CV: RRR, no MRG or pitting edema GI: Soft, NT, ND, +BS  Neuro: Alert and oriented. No new focal deficits. Ext: Warm, no deformities. Skin: No rashes, lesions or ulcers on visualized skin   Assessment & Plan: Principal Problem:   Hyperosmolar hyperglycemic state (HHS) ([place]) Active Problems:   AKI (acute kidney injury) ([place])   Hyperkalemia   Essential hypertension   Uveitis  HHS, newly diagnosed T2DM:  - HbA1c still pending - Diabetes coordinator consulted - Transition to basal-bolus insulin. Start low 15u glargine daily, mod SSI and titrate. Stop drip 2 hrs after glargine. - Will start ARB, statin. Check lipid panel for baseline.   HTN urgency: SBP >200 this morning with headache (may not be related).  - Start losartan in setting of new DM - Hold diuretic for now - Start prn hydralazine  AKI: Improving, can transition to diet and lower/stop IVF based on tolerance  Inflammatory arthritis,  uveitis:  - Continue home imuran, prograf. No plans to disrupt outpatient humira.  - Follow up with outpatient providers after discharge.  Hyperkalemia: Resolved.   Pseudohyponatremia: Resolved  Patrecia Pour, MD [facility] [email or web address] 10/28/2022, 8:24 AM

## 2022-10-28 NOTE — Progress Notes (Signed)
Called about a new rash, came to bedside on 5th floor. Pt reports improved headache, feeling "a whole lot better." Though, over the course of this day she's noticed an itchy rash on her left buttock. This was viewed by RN who felt consistent with vesicles. Pt states she's never had something like this before. It does not hurt at all. It does feel itchy.   The rash is ~1cm diameter round confluence of mild erythema with several tiny vesicles all still roofed on the left buttock, not within the cleft. It is not tender, no other lesions in that dermatome or crossing midline or elsewhere.   Discussed with ID, Dr. Candiss Norse. High degree of suspicion for shingles. Contact irritant and allergic dermatitis is also on differential. Given the typical morphology for shingles and patient's immunosuppressed status, we will institute contact precautions and administer valacyclovir 1g TID with plan for 7 days. She does not appear to have disseminated disease at this time, only 1 dermatome involved. Ordered PCR swab, discussed with RN who will coordinate correct swab with lab. ID will follow up 3/6.   Vance Gather, MD 10/28/2022 7:25 PM

## 2022-10-28 NOTE — Inpatient Diabetes Management (Signed)
Inpatient Diabetes Program Recommendations  AACE/ADA: New Consensus Statement on Inpatient Glycemic Control (2015)  Target Ranges:  Prepandial:   less than 140 mg/dL      Peak postprandial:   less than 180 mg/dL (1-2 hours)      Critically ill patients:  140 - 180 mg/dL   Lab Results  Component Value Date   GLUCAP 247 (H) 10/28/2022    Review of Glycemic Control  Latest Reference Range & Units 10/28/22 04:20 10/28/22 05:06 10/28/22 05:32 10/28/22 06:27  Glucose-Capillary 70 - 99 mg/dL 159 (H) 203 (H) 221 (H) 247 (H)  (H): Data is abnormally high Diabetes history: New onset DM Outpatient Diabetes medications: none Current orders for Inpatient glycemic control: Semglee 15 units QD, Novolog 0-15 units TID & HS  Inpatient Diabetes Program Recommendations:    Spoke with patient and significant other at bedsider regarding diagnosis of new onset DM.  Explained what a A1c is and what it measures; awaiting pending result. Also reviewed goal A1c with patient, importance of good glucose control @ home, and blood sugar goals. Reviewed patho of DM, role of pancreas, signs and symptoms of hypoglycemia vs hyperglycemia, interventions, vascular changes and commorbidities.  Patient will need a meter at discharge. Reviewed recommended frequency of checking blood sugars and when to call MD. Also, briefly discussed CGM. Patient is interested. Encouraged to begin learning how to check with hospital meter and perform self injection.  Patient admits to occasionally drinking sugary beverages. Reviewed alternatives, importance of protein, plate method and basic carb counting.  Ordered LWWDM booklet, dietitian consult and insulin starter kit.   Will plan to see patient again on 3/6 to review insulin pen.  Thanks, Bronson Curb, MSN, RNC-OB Diabetes Coordinator [phone] (8a-5p)

## 2022-10-28 NOTE — Discharge Instructions (Signed)
Check blood sugars -First thing in AM  -With meals -At bedtime  If blood sugars consistently above 200 mg/dL reach out to doctor. If more than one low blood sugar (<70 mg/dL) a week, reach out to doctor.  If at pharmacy meter cost is elevated another option is [facility] -Relion meter -Relion test strips -Relion lancet device   Carbohydrate Counting For People With Diabetes  Foods with carbohydrates make your blood glucose level go up. Learning how to count carbohydrates can help you control your blood glucose levels. First, identify the foods you eat that contain carbohydrates. Then, using the Foods with Carbohydrates chart, determine about how much carbohydrates are in your meals and snacks. Make sure you are eating foods with fiber, protein, and healthy fat along with your carbohydrate foods. Foods with Carbohydrates The following table shows carbohydrate foods that have about 15 grams of carbohydrate each. Using measuring cups, spoons, or a food scale when you first begin learning about carbohydrate counting can help you learn about the portion sizes you typically eat. The following foods have 15 grams carbohydrate each:  Grains 1 slice bread (1 ounce)  1 small tortilla (6-inch size)   large bagel (1 ounce)  1/3 cup pasta or rice (cooked)   hamburger or hot dog bun ( ounce)   cup cooked cereal   to  cup ready-to-eat cereal  2 taco shells (5-inch size) Fruit 1 small fresh fruit ( to 1 cup)   medium banana  17 small grapes (3 ounces)  1 cup melon or berries   cup canned or frozen fruit  2 tablespoons dried fruit (blueberries, cherries, cranberries, raisins)   cup unsweetened fruit juice  Starchy Vegetables  cup cooked beans, peas, corn, potatoes/sweet potatoes   large baked potato (3 ounces)  1 cup acorn or butternut squash  Snack Foods 3 to 6 crackers  8 potato chips or 13 tortilla chips ( ounce to 1 ounce)  3 cups popped popcorn  Dairy 3/4 cup (6 ounces)  nonfat plain yogurt, or yogurt with sugar-free sweetener  1 cup milk  1 cup plain rice, soy, coconut or flavored almond milk Sweets and Desserts  cup ice cream or frozen yogurt  1 tablespoon jam, jelly, pancake syrup, table sugar, or honey  2 tablespoons light pancake syrup  1 inch square of frosted cake or 2 inch square of unfrosted cake  2 small cookies (2/3 ounce each) or  large cookie  Sometimes you'll have to estimate carbohydrate amounts if you don't know the exact recipe. One cup of mixed foods like soups can have 1 to 2 carbohydrate servings, while some casseroles might have 2 or more servings of carbohydrate. Foods that have less than 20 calories in each serving can be counted as "free" foods. Count 1 cup raw vegetables, or  cup cooked non-starchy vegetables as "free" foods. If you eat 3 or more servings at one meal, then count them as 1 carbohydrate serving.  Foods without Carbohydrates  Not all foods contain carbohydrates. Meat, some dairy, fats, non-starchy vegetables, and many beverages don't contain carbohydrate. So when you count carbohydrates, you can generally exclude chicken, pork, beef, fish, seafood, eggs, tofu, cheese, butter, sour cream, avocado, nuts, seeds, olives, mayonnaise, water, black coffee, unsweetened tea, and zero-calorie drinks. Vegetables with no or low carbohydrate include green beans, cauliflower, tomatoes, and onions. How much carbohydrate should I eat at each meal?  Carbohydrate counting can help you plan your meals and manage your weight. Following are some starting  points for carbohydrate intake at each meal. Work with your registered dietitian nutritionist to find the best range that works for your blood glucose and weight.   To Lose Weight To Maintain Weight  Women 2 - 3 carb servings 3 - 4 carb servings  Men 3 - 4 carb servings 4 - 5 carb servings  Checking your blood glucose after meals will help you know if you need to adjust the timing, type, or  number of carbohydrate servings in your meal plan. Achieve and keep a healthy body weight by balancing your food intake and physical activity.  Tips How should I plan my meals?  Plan for half the food on your plate to include non-starchy vegetables, like salad greens, broccoli, or carrots. Try to eat 3 to 5 servings of non-starchy vegetables every day. Have a protein food at each meal. Protein foods include chicken, fish, meat, eggs, or beans (note that beans contain carbohydrate). These two food groups (non-starchy vegetables and proteins) are low in carbohydrate. If you fill up your plate with these foods, you will eat less carbohydrate but still fill up your stomach. Try to limit your carbohydrate portion to  of the plate.  What fats are healthiest to eat?  Diabetes increases risk for heart disease. To help protect your heart, eat more healthy fats, such as olive oil, nuts, and avocado. Eat less saturated fats like butter, cream, and high-fat meats, like bacon and sausage. Avoid trans fats, which are in all foods that list "partially hydrogenated oil" as an ingredient. What should I drink?  Choose drinks that are not sweetened with sugar. The healthiest choices are water, carbonated or seltzer waters, and tea and coffee without added sugars.  Sweet drinks will make your blood glucose go up very quickly. One serving of soda or energy drink is  cup. It is best to drink these beverages only if your blood glucose is low.  Artificially sweetened, or diet drinks, typically do not increase your blood glucose if they have zero calories in them. Read labels of beverages, as some diet drinks do have carbohydrate and will raise your blood glucose. Label Reading Tips Read Nutrition Facts labels to find out how many grams of carbohydrate are in a food you want to eat. Don't forget: sometimes serving sizes on the label aren't the same as how much food you are going to eat, so you may need to calculate how much  carbohydrate is in the food you are serving yourself.   Carbohydrate Counting for People with Diabetes Sample 1-Day Menu  Breakfast  cup yogurt, low fat, low sugar (1 carbohydrate serving)   cup cereal, ready-to-eat, unsweetened (1 carbohydrate serving)  1 cup strawberries (1 carbohydrate serving)   cup almonds ( carbohydrate serving)  Lunch 1, 5 ounce can chunk light tuna  2 ounces cheese, low fat cheddar  6 whole wheat crackers (1 carbohydrate serving)  1 small apple (1 carbohydrate servings)   cup carrots ( carbohydrate serving)   cup snap peas  1 cup 1% milk (1 carbohydrate serving)   Evening Meal Stir fry made with: 3 ounces chicken  1 cup brown rice (3 carbohydrate servings)   cup broccoli ( carbohydrate serving)   cup green beans   cup onions  1 tablespoon olive oil  2 tablespoons teriyaki sauce ( carbohydrate serving)  Evening Snack 1 extra small banana (1 carbohydrate serving)  1 tablespoon peanut butter   Carbohydrate Counting for People with Diabetes Vegan Sample 1-Day Menu  Breakfast 1 cup cooked oatmeal (2 carbohydrate servings)   cup blueberries (1 carbohydrate serving)  2 tablespoons flaxseeds  1 cup soymilk fortified with calcium and vitamin D  1 cup coffee  Lunch 2 slices whole wheat bread (2 carbohydrate servings)   cup baked tofu   cup lettuce  2 slices tomato  2 slices avocado   cup baby carrots ( carbohydrate serving)  1 orange (1 carbohydrate serving)  1 cup soymilk fortified with calcium and vitamin D   Evening Meal Burrito made with: 1 6-inch corn tortilla (1 carbohydrate serving)  1 cup refried vegetarian beans (2 carbohydrate servings)   cup chopped tomatoes   cup lettuce   cup salsa  1/3 cup brown rice (1 carbohydrate serving)  1 tablespoon olive oil for rice   cup zucchini   Evening Snack 6 small whole grain crackers (1 carbohydrate serving)  2 apricots ( carbohydrate serving)   cup unsalted peanuts ( carbohydrate  serving)    Carbohydrate Counting for People with Diabetes Vegetarian (Lacto-Ovo) Sample 1-Day Menu  Breakfast 1 cup cooked oatmeal (2 carbohydrate servings)   cup blueberries (1 carbohydrate serving)  2 tablespoons flaxseeds  1 egg  1 cup 1% milk (1 carbohydrate serving)  1 cup coffee  Lunch 2 slices whole wheat bread (2 carbohydrate servings)  2 ounces low-fat cheese   cup lettuce  2 slices tomato  2 slices avocado   cup baby carrots ( carbohydrate serving)  1 orange (1 carbohydrate serving)  1 cup unsweetened tea  Evening Meal Burrito made with: 1 6-inch corn tortilla (1 carbohydrate serving)   cup refried vegetarian beans (1 carbohydrate serving)   cup tomatoes   cup lettuce   cup salsa  1/3 cup brown rice (1 carbohydrate serving)  1 tablespoon olive oil for rice   cup zucchini  1 cup 1% milk (1 carbohydrate serving)  Evening Snack 6 small whole grain crackers (1 carbohydrate serving)  2 apricots ( carbohydrate serving)   cup unsalted peanuts ( carbohydrate serving)    Copyright 2020  Academy of [employer] Dietetics. All rights reserved.  Using Nutrition Labels: Carbohydrate  Serving Size  Look at the serving size. All the information on the label is based on this portion. Servings Per Container  The number of servings contained in the package. Guidelines for Carbohydrate  Look at the total grams of carbohydrate in the serving size.  1 carbohydrate choice = 15 grams of carbohydrate. Range of Carbohydrate Grams Per Choice  Carbohydrate Grams/Choice Carbohydrate Choices  6-10   11-20 1  21-25 1  26-35 2  36-40 2  41-50 3  51-55 3  56-65 4  66-70 4  71-80 5    Copyright 2020  Academy of [employer] and Dietetics. All rights reserved.

## 2022-10-28 NOTE — TOC Progression Note (Addendum)
Transition of Care [facility]) - Progression Note    Patient Details  Name: Olivia Barnes MRN: [id number] Date of Birth: 1955-12-21  Transition of Care [facility]) CM/SW Contact  Servando Snare,  Phone Number: 10/28/2022, 8:34 AM  Clinical Narrative:    Transition of Care (TOC) Screening Note   Patient Details  Name: Olivia Barnes Date of Birth: November 11, 1955   Transition of Care [facility]) CM/SW Contact:    Servando Snare, LCSW Phone Number: 10/28/2022, 8:35 AM    Transition of Care Department [facility]) has reviewed patient and no TOC needs have been identified at this time. We will continue to monitor patient advancement through interdisciplinary progression rounds. If new patient transition needs arise, please place a TOC consult.           Expected Discharge Plan and Services                                               Social Determinants of Health (SDOH) Interventions SDOH Screenings   Food Insecurity: No Food Insecurity (10/27/2022)  Housing: Low Risk  (10/27/2022)  Transportation Needs: No Transportation Needs (10/27/2022)  Utilities: Not At Risk (10/27/2022)  Tobacco Use: Low Risk  (10/27/2022)    Readmission Risk Interventions     No data to display

## 2022-10-29 ENCOUNTER — Other Ambulatory Visit (HOSPITAL_COMMUNITY): Payer: Self-pay

## 2022-10-29 LAB — BASIC METABOLIC PANEL
Anion gap: 6 (ref 5–15)
BUN: 23 mg/dL (ref 8–23)
CO2: 25 mmol/L (ref 22–32)
Calcium: 9 mg/dL (ref 8.9–10.3)
Chloride: 105 mmol/L (ref 98–111)
Creatinine, Ser: 0.88 mg/dL (ref 0.44–1.00)
GFR, Estimated: >60 mL/min (ref >60)
Glucose, Bld: 295 mg/dL — ABNORMAL HIGH (ref 70–99)
Potassium: 4.6 mmol/L (ref 3.5–5.1)
Sodium: 136 mmol/L (ref 135–145)

## 2022-10-29 LAB — GLUCOSE, CAPILLARY
Glucose-Capillary: 271 mg/dL — ABNORMAL HIGH (ref 70–99)
Glucose-Capillary: 249 mg/dL — ABNORMAL HIGH (ref 70–99)

## 2022-10-29 LAB — LIPID PANEL
Cholesterol: 186 mg/dL (ref 0–200)
HDL: 55 mg/dL (ref >40)
LDL Cholesterol: 105 mg/dL — ABNORMAL HIGH (ref 0–99)
Total CHOL/HDL Ratio: 3.4 RATIO
Triglycerides: 129 mg/dL (ref <150)
VLDL: 26 mg/dL (ref 0–40)

## 2022-10-29 LAB — HEMOGLOBIN A1C
Hgb A1c MFr Bld: 10.5 % — ABNORMAL HIGH (ref 4.8–5.6)
Mean Plasma Glucose: 255 mg/dL

## 2022-10-29 MED ORDER — BLOOD GLUCOSE TEST VI STRP: 1.0000 | ORAL_STRIP | Freq: Three times a day (TID) | 0 refills | Status: AC

## 2022-10-29 MED ORDER — LANCET DEVICE MISC: 1.0000 | Freq: Three times a day (TID) | 0 refills | Status: AC

## 2022-10-29 MED ORDER — BLOOD GLUCOSE MONITORING SUPPL DEVI: 1.0000 | Freq: Three times a day (TID) | 0 refills | Status: AC

## 2022-10-29 MED ORDER — LOSARTAN POTASSIUM 50 MG PO TABS: 50.0000 mg | ORAL_TABLET | Freq: Every day | ORAL | 1 refills | Status: AC

## 2022-10-29 MED ORDER — INSULIN PEN NEEDLE 30G X 5 MM MISC: 1.0000 | Freq: Three times a day (TID) | 1 refills | Status: AC

## 2022-10-29 MED ORDER — INSULIN GLARGINE 100 UNIT/ML SOLOSTAR PEN: 28.0000 [IU] | PEN_INJECTOR | Freq: Every day | SUBCUTANEOUS | 1 refills | Status: AC

## 2022-10-29 MED ORDER — NOVOLOG FLEXPEN 100 UNIT/ML ~~LOC~~ SOPN: 8.0000 [IU] | PEN_INJECTOR | Freq: Three times a day (TID) | SUBCUTANEOUS | 1 refills | Status: AC

## 2022-10-29 MED ORDER — INSULIN ASPART 100 UNIT/ML IJ SOLN
6.0000 [IU] | Freq: Three times a day (TID) | INTRAMUSCULAR | Status: DC
  Administered 2022-10-29: 6 [IU] via SUBCUTANEOUS

## 2022-10-29 MED ORDER — VALACYCLOVIR HCL 1 G PO TABS: 1000.0000 mg | ORAL_TABLET | Freq: Three times a day (TID) | ORAL | 0 refills | Status: AC

## 2022-10-29 MED ORDER — INSULIN GLARGINE-YFGN 100 UNIT/ML ~~LOC~~ SOLN
25.0000 [IU] | Freq: Every day | SUBCUTANEOUS | Status: DC
  Administered 2022-10-29: 25 [IU] via SUBCUTANEOUS
  Filled 2022-10-29: qty 0.25

## 2022-10-29 MED ORDER — LANCETS MISC. MISC: 1.0000 | Freq: Three times a day (TID) | 0 refills | Status: AC

## 2022-10-29 NOTE — Discharge Summary (Signed)
Physician Discharge Summary  Olivia Barnes [id number] DOB: Jul 28, 1956 DOA: 10/27/2022  PCP: Charleston Poot, MD  Admit date: 10/27/2022 Discharge date: 10/29/2022  Admitted From: Home Disposition:  Home  Discharge Condition:Stable CODE STATUS:FULL Diet recommendation:  Carb Modified   Brief/Interim Summary: Patient is a 67 y.o. 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 Insulin and IV fluid infusions initiated and the patient was admitted.  Blood sugars improved, diabetic coordinator consulted and following.  A1c found to be more than 10.  Decision made to start her on insulin.  Medically stable for discharge home today.  Following problems were addressed during the hospitalization:  HHS, newly diagnosed T2DM:  - HbA1c more than 10 - Diabetes coordinator consulted -Started on Lantus and NovoLog meal coverage.  Continue same on discharge.  Patient has been instructed to monitor her blood sugars very well at home. -Follow-up with PCP in a week.   HTN : SBP >200 this morning with headache during this hospitalization - Start losartan in setting of new DM - Also takes hydrochlorothiazide once every other day   AKI: resolved   Inflammatory arthritis, uveitis:  - Continue home imuran, prograf. No plans to disrupt outpatient humira.  - Follow up with outpatient providers after discharge.   Hyperkalemia: Resolved.    Pseudohyponatremia: Resolved  Left buttock vesicular rash: Concern for shingles.  Rash is very small size, has some vesicular lesions, already started on valacyclovir.  ID recommended to continue for 7 days.   Discharge Diagnoses:  Principal Problem:   Hyperosmolar hyperglycemic state (HHS) ([place]) Active Problems:   AKI (acute kidney injury) ([place])   Hyperkalemia   Essential  hypertension   Uveitis    Discharge Instructions  Discharge Instructions     Ambulatory referral to Nutrition and Diabetic Education   Complete by: As directed    Diet Carb Modified   Complete by: As directed    Discharge instructions   Complete by: As directed    1)Please take prescribed medications as instructed 2)Follow up with your PCP in a week.Monitor your blood sugars at home   Increase activity slowly   Complete by: As directed       Allergies as of 10/29/2022       Reactions   Gabapentin Nausea And Vomiting   Meloxicam    Penicillins Rash        Medication List     STOP taking these medications    tiZANidine 2 MG tablet Commonly known as: ZANAFLEX       TAKE these medications    azaTHIOprine 50 MG tablet Commonly known as: IMURAN Take 100 mg by mouth daily.   bimatoprost 0.03 % ophthalmic solution Commonly known as: LUMIGAN Place 1 drop into both eyes at bedtime.   Blood Glucose Monitoring Suppl Devi 1 each by Does not apply route in the morning, at noon, and at bedtime. May substitute to any manufacturer covered by patient's insurance.   BLOOD GLUCOSE TEST STRIPS Strp 1 each by In Vitro route in the morning, at noon, and at bedtime. May substitute to any manufacturer covered by patient's insurance.   clobetasol ointment 0.05 % Commonly known as: TEMOVATE Apply 1 Application topically 2 (two) times daily as needed (alopecia).   Combigan 0.2-0.5 % ophthalmic solution Generic drug: brimonidine-timolol Place 1 drop into both eyes 2 (two) times  daily.   Humira (2 Syringe) 40 MG/0.4ML Pskt Generic drug: Adalimumab Inject 40 mg into the skin every 14 (fourteen) days.   hydrochlorothiazide 25 MG tablet Commonly known as: HYDRODIURIL Take 12.5 mg by mouth every other day.   insulin glargine 100 UNIT/ML Solostar Pen Commonly known as: LANTUS Inject 28 Units into the skin daily.   Insulin Pen Needle 30G X 5 MM Misc 1 each by Does not apply  route 3 (three) times daily.   Lancet Device Misc 1 each by Does not apply route in the morning, at noon, and at bedtime. May substitute to any manufacturer covered by patient's insurance.   Lancets Misc. Misc 1 each by Does not apply route in the morning, at noon, and at bedtime. May substitute to any manufacturer covered by patient's insurance.   losartan 50 MG tablet Commonly known as: COZAAR Take 1 tablet (50 mg total) by mouth daily. Start taking on: October 30, 2022   MULTIVITAMIN PO Take 1 tablet by mouth daily.   NovoLOG FlexPen 100 UNIT/ML FlexPen Generic drug: insulin aspart Inject 8 Units into the skin 3 (three) times daily with meals.   Pennsaid 2 % Soln Generic drug: diclofenac Sodium APPLY 2 PUMPS TOPICALLY TO THE AFFECTED AREA(S) TWICE DAILY AS DIRECTED   rosuvastatin 20 MG tablet Commonly known as: CRESTOR Take 20 mg by mouth daily.   tacrolimus 0.1 % ointment Commonly known as: PROTOPIC Apply 1 Application topically 2 (two) times a week.   tacrolimus 1 MG capsule Commonly known as: PROGRAF Take 2 mg by mouth 2 (two) times daily.   Travoprost (BAK Free) 0.004 % Soln ophthalmic solution Commonly known as: TRAVATAN Place 1 drop into both eyes at bedtime.   valACYclovir 1000 MG tablet Commonly known as: VALTREX Take 1 tablet (1,000 mg total) by mouth 3 (three) times daily for 6 days.        Follow-up Information     Charleston Poot, MD. Schedule an appointment as soon as possible for a visit in 1 week(s).   Specialty: Internal Medicine Contact information: [address] [place] [address] [phone]                Allergies  Allergen Reactions   Gabapentin Nausea And Vomiting   Meloxicam    Penicillins Rash    Consultations: ID   Procedures/Studies: DG Cervical Spine 2 or 3 views  Result Date: 10/17/2022 CLINICAL DATA:  Left neck and shoulder pain for the past 2-3 months. No known injury. EXAM: CERVICAL SPINE - 2-3  VIEW COMPARISON:  None Available. FINDINGS: Disc space narrowing and anterior spur formation throughout the cervical spine to the C6-7 level. The cervicothoracic region is obscured by overlapping of the patient's shoulders with no swimmer's view. No prevertebral mild posterior spur formation at the C3-4, C4-5 and C5-6 levels. Normal alignment. IMPRESSION: 1. Degenerative changes, as described above. 2. The cervicothoracic region is obscured by overlapping of the patient's shoulders. Electronically Signed   By: Claudie Revering M.D.   On: 10/17/2022 15:46   DG Shoulder Left  Result Date: 10/16/2022 CLINICAL DATA:  Left shoulder pain for 2-3 months. EXAM: LEFT SHOULDER - 2+ VIEW COMPARISON:  Left shoulder radiographs 08/25/2017 FINDINGS: There is diffuse decreased bone mineralization. Moderate inferior glenoid degenerative osteophytosis. Medical navicular joint space narrowing and peripheral osteophytosis. Moderate lateral downsloping of the acromion with mild-to-moderate distal lateral subacromial spurring. Partial visualization of left humeral shaft plate and screw fixation hardware. No acute fracture  or dislocation. IMPRESSION: 1. 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 2. Moderate lateral downsloping of the acromion with mild-to-moderate distal lateral subacromial spurring. Electronically Signed   By: Yvonne Kendall M.D.   On: 10/16/2022 19:02      Subjective:  Patient seen and examined at bedside today.  Hemodynamically stable for discharge.  Comfortable, very eager to go home.  Rash examined at bedside, it is very small size, not bothering patient at all  Discharge Exam: Vitals:   10/29/22 0636 10/29/22 1148  BP: (!) 155/59 (!) 152/79  Pulse: 64 92  Resp: 18 18  Temp: 98.6 F (37 C) 97.7 F (36.5 C)  SpO2: 100% 98%   Vitals:   10/28/22 1824 10/28/22 2034 10/29/22 0636 10/29/22 1148  BP: (!) 155/49 (!) 155/64 (!) 155/59 (!) 152/79  Pulse: 78 65 64 92  Resp: '19 16 18 18  '$ Temp: 97.9 F  (36.6 C) 98.8 F (37.1 C) 98.6 F (37 C) 97.7 F (36.5 C)  TempSrc: Oral Oral Oral Oral  SpO2: 99% 99% 100% 98%  Weight:      Height:        General: Pt is alert, awake, not in acute distress, obese Cardiovascular: RRR, S1/S2 +, no rubs, no gallops Respiratory: CTA bilaterally, no wheezing, no rhonchi Abdominal: Soft, NT, ND, bowel sounds + Extremities: no edema, no cyanosis, small area of vesicular rash on the left buttock    The results of significant diagnostics from this hospitalization (including imaging, microbiology, ancillary and laboratory) are listed below for reference.     Microbiology: Recent Results (from the past 240 hour(s))  MRSA Next Gen by PCR, Nasal     Status: None   Collection Time: 10/27/22  9:14 PM   Specimen: Nasal Mucosa; Nasal Swab  Result Value Ref Range Status   MRSA by PCR Next Gen NOT DETECTED NOT DETECTED Final    Comment: (NOTE) The GeneXpert MRSA Assay (FDA approved for NASAL specimens only), is one component of a comprehensive MRSA colonization surveillance program. It is not intended to diagnose MRSA infection nor to guide or monitor treatment for MRSA infections. Test performance is not FDA approved in patients less than 61 years old. Performed at [facility], [place] [address] [place], [place] [address]      Labs: BNP (last 3 results) No results for input(s): "BNP" in the last 8760 hours. Basic Metabolic Panel: Recent Labs  Lab 10/27/22 2329 10/28/22 0322 10/28/22 0710 10/28/22 1054 10/29/22 0535  NA 135 139 138 137 136  K 4.2 4.6 4.1 4.1 4.6  CL 105 106 106 104 105  CO2 '26 27 27 28 25  '$ GLUCOSE 228* 204* 229* 284* 295*  BUN 30* 27* 25* 19 23  CREATININE 1.00 0.98 0.86 0.89 0.88  CALCIUM 8.9 9.3 8.9 8.9 9.0   Liver Function Tests: No results for input(s): "AST", "ALT", "ALKPHOS", "BILITOT", "PROT", "ALBUMIN" in the last 168 hours. No results for input(s): "LIPASE", "AMYLASE" in the last 168 hours. No  results for input(s): "AMMONIA" in the last 168 hours. CBC: Recent Labs  Lab 10/27/22 1430 10/27/22 1449  WBC 9.5  --   HGB 14.2 13.6  HCT 42.1 40.0  MCV 92.1  --   PLT 180  --    Cardiac Enzymes: No results for input(s): "CKTOTAL", "CKMB", "CKMBINDEX", "TROPONINI" in the last 168 hours. BNP: Invalid input(s): "POCBNP" CBG: Recent Labs  Lab 10/28/22 1150 10/28/22 1623 10/28/22 2031 10/29/22 0723 10/29/22 1143  GLUCAP 228* 252* 327* 271*  249*   D-Dimer No results for input(s): "DDIMER" in the last 72 hours. Hgb A1c Recent Labs    10/27/22 2329  HGBA1C 10.5*   Lipid Profile Recent Labs    10/29/22 0535  CHOL 186  HDL 55  LDLCALC 105*  TRIG 129  CHOLHDL 3.4   Thyroid function studies No results for input(s): "TSH", "T4TOTAL", "T3FREE", "THYROIDAB" in the last 72 hours.  Invalid input(s): "FREET3" Anemia work up No results for input(s): "VITAMINB12", "FOLATE", "FERRITIN", "TIBC", "IRON", "RETICCTPCT" in the last 72 hours. Urinalysis    Component Value Date/Time   COLORURINE YELLOW 10/27/2022 1459   APPEARANCEUR CLOUDY (A) 10/27/2022 1459   LABSPEC <=1.005 10/27/2022 1459   PHURINE 5.0 10/27/2022 1459   GLUCOSEU >=500 (A) 10/27/2022 1459   HGBUR TRACE (A) 10/27/2022 1459   BILIRUBINUR NEGATIVE 10/27/2022 1459   KETONESUR 15 (A) 10/27/2022 1459   PROTEINUR NEGATIVE 10/27/2022 1459   UROBILINOGEN 0.2 07/06/2010 2207   NITRITE NEGATIVE 10/27/2022 1459   LEUKOCYTESUR TRACE (A) 10/27/2022 1459   Sepsis Labs Recent Labs  Lab 10/27/22 1430  WBC 9.5   Microbiology Recent Results (from the past 240 hour(s))  MRSA Next Gen by PCR, Nasal     Status: None   Collection Time: 10/27/22  9:14 PM   Specimen: Nasal Mucosa; Nasal Swab  Result Value Ref Range Status   MRSA by PCR Next Gen NOT DETECTED NOT DETECTED Final    Comment: (NOTE) The GeneXpert MRSA Assay (FDA approved for NASAL specimens only), is one component of a comprehensive MRSA colonization  surveillance program. It is not intended to diagnose MRSA infection nor to guide or monitor treatment for MRSA infections. Test performance is not FDA approved in patients less than 13 years old. Performed at [facility], [place] [address], [place], [place] [address]     Please note: You were cared for by a hospitalist during your hospital stay. Once you are discharged, your primary care physician will handle any further medical issues. Please note that NO REFILLS for any discharge medications will be authorized once you are discharged, as it is imperative that you return to your primary care physician (or establish a relationship with a primary care physician if you do not have one) for your post hospital discharge needs so that they can reassess your need for medications and monitor your lab values.    Time coordinating discharge: 40 minutes  SIGNED:   Shelly Coss, MD  [facility] 10/29/2022, 2:27 PM Pager [phone]  If 7PM-7AM, please contact night-coverage [email or web address] Password [email or web address]

## 2022-10-29 NOTE — Consult Note (Signed)
[facility]  Total days of antibiotics 2       Reason for Consult: possible shingles   Referring Physician: Bonner Puna  Principal Problem:   Hyperosmolar hyperglycemic state (HHS) ([place]) Active Problems:   Essential hypertension   Uveitis   AKI (acute kidney injury) ([place])   Hyperkalemia    HPI: Olivia Barnes is a 67 y.o. female with history of immunosuppression for panuveitis admitted for hyperosmolar hyperglycemic states with BS in the 600. Also found to have new rash on left buttocks, small cluster of pearly small blisters in one patch. No tenderness. Concern for shingles, thus ID consulted. No fever, shortness of breath/cough nor any transaminitis. No other site of rash. " I don't think this is shingles, my brother had it and it was quite painful"  Past Medical History:  Diagnosis Date   Arthritis    Hyperlipemia    Hypertension     Allergies:  Allergies  Allergen Reactions   Gabapentin Nausea And Vomiting   Meloxicam    Penicillins Rash    MEDICATIONS:  atorvastatin  40 mg Oral Daily   azaTHIOprine  100 mg Oral Daily   Chlorhexidine Gluconate Cloth  6 each Topical Daily   enoxaparin (LOVENOX) injection  40 mg Subcutaneous Q24H   insulin aspart  0-15 Units Subcutaneous TID WC   insulin aspart  0-5 Units Subcutaneous QHS   insulin aspart  6 Units Subcutaneous TID WC   insulin glargine-yfgn  25 Units Subcutaneous Daily   losartan  50 mg Oral Daily   tacrolimus  2 mg Oral BID   valACYclovir  1,000 mg Oral TID    Social History   Tobacco Use   Smoking status: Never   Smokeless tobacco: Never  Vaping Use   Vaping Use: Never used  Substance Use Topics   Alcohol use: Never    Alcohol/week: 0.0 standard drinks of alcohol   Drug use: Never    Family History  Problem Relation Age of Onset   Heart disease Mother    Healthy Brother      Review of Systems  Constitutional: Negative for fever, chills, diaphoresis, activity change,  appetite change, fatigue and unexpected weight change.  HENT: Negative for congestion, sore throat, rhinorrhea, sneezing, trouble swallowing and sinus pressure.  Eyes: Negative for photophobia and visual disturbance.  Respiratory: Negative for cough, chest tightness, shortness of breath, wheezing and stridor.  Cardiovascular: Negative for chest pain, palpitations and leg swelling.  Gastrointestinal: Negative for nausea, vomiting, abdominal pain, diarrhea, constipation, blood in stool, abdominal distention and anal bleeding.  Genitourinary: Negative for dysuria, hematuria, flank pain and difficulty urinating.  Musculoskeletal: Negative for myalgias, back pain, joint swelling, arthralgias and gait problem.  Skin: Negative for color change, pallor, rash and wound.  Neurological: Negative for dizziness, tremors, weakness and light-headedness.  Hematological: Negative for adenopathy. Does not bruise/bleed easily.  Psychiatric/Behavioral: Negative for behavioral problems, confusion, sleep disturbance, dysphoric mood, decreased concentration and agitation.    OBJECTIVE: Temp:  [97.7 F (36.5 C)-98.8 F (37.1 C)] 97.7 F (36.5 C) (03/06 1148) Pulse Rate:  [63-92] 92 (03/06 1148) Resp:  [14-19] 18 (03/06 1148) BP: (149-161)/(49-94) 152/79 (03/06 1148) SpO2:  [98 %-100 %] 98 % (03/06 1148) Physical Exam  Constitutional:  oriented to person, place, and time. appears well-developed and well-nourished. No distress.  HENT: [place]/AT, PERRLA, no scleral icterus Mouth/Throat: Oropharynx is clear and moist. No oropharyngeal exudate.  Cardiovascular: Normal rate, regular rhythm and normal heart sounds. Exam  reveals no gallop and no friction rub.  No murmur heard.  Pulmonary/Chest: Effort normal and breath sounds normal. No respiratory distress.  has no wheezes.  Neck = supple, no nuchal rigidity Abdominal: Soft. Bowel sounds are normal.  exhibits no distension. There is no tenderness.  Lymphadenopathy: no  cervical adenopathy. No axillary adenopathy Neurological: alert and oriented to person, place, and time.  Skin: Skin is warm and dry. Small cluster of small clear fluid blisters size of  a quarter on left buttocks  Psychiatric: a normal mood and affect.  behavior is normal.    LABS: Results for orders placed or performed during the hospital encounter of 10/27/22 (from the past 48 hour(s))  POC CBG, ED     Status: Abnormal   Collection Time: 10/27/22  2:20 PM  Result Value Ref Range   Glucose-Capillary >600 (HH) 70 - 99 mg/dL    Comment: Glucose reference range applies only to samples taken after fasting for at least 8 hours.   Comment 1 Notify RN   Basic metabolic panel     Status: Abnormal   Collection Time: 10/27/22  2:30 PM  Result Value Ref Range   Sodium 127 (L) 135 - 145 mmol/L   Potassium 5.3 (H) 3.5 - 5.1 mmol/L   Chloride 95 (L) 98 - 111 mmol/L   CO2 23 22 - 32 mmol/L   Glucose, Bld 726 (HH) 70 - 99 mg/dL    Comment: CRITICAL RESULT CALLED TO, READ BACK BY AND VERIFIED WITH MARVA SIMMS RN AT 1529 ON 10/27/22 BY I.SUGUT Glucose reference range applies only to samples taken after fasting for at least 8 hours.    BUN 35 (H) 8 - 23 mg/dL   Creatinine, Ser 1.36 (H) 0.44 - 1.00 mg/dL   Calcium 9.3 8.9 - 10.3 mg/dL   GFR, Estimated 43 (L) >60 mL/min    Comment: (NOTE) Calculated using the CKD-EPI Creatinine Equation (2021)    Anion gap 9 5 - 15    Comment: Performed at [facility], [address] [place], [place] [address]  CBC     Status: None   Collection Time: 10/27/22  2:30 PM  Result Value Ref Range   WBC 9.5 4.0 - 10.5 K/uL   RBC 4.57 3.87 - 5.11 MIL/uL   Hemoglobin 14.2 12.0 - 15.0 g/dL   HCT 42.1 36.0 - 46.0 %   MCV 92.1 80.0 - 100.0 fL   MCH 31.1 26.0 - 34.0 pg   MCHC 33.7 30.0 - 36.0 g/dL   RDW 13.5 11.5 - 15.5 %   Platelets 180 150 - 400 K/uL   nRBC 0.0 0.0 - 0.2 %    Comment: Performed at [facility], [address] [place], [place] [address]  Beta-hydroxybutyric acid     Status: Abnormal   Collection Time: 10/27/22  2:30 PM  Result Value Ref Range   Beta-Hydroxybutyric Acid 1.12 (H) 0.05 - 0.27 mmol/L    Comment: Performed at [facility], [place] [address], [place], [place] [address]  I-Stat venous blood gas, ED     Status: Abnormal   Collection Time: 10/27/22  2:49 PM  Result Value Ref Range   pH, Ven 7.453 (H) 7.25 - 7.43   pCO2, Ven 32.9 (L) 44 - 60 mmHg   pO2, Ven 41 32 - 45 mmHg   Bicarbonate 23.0 20.0 - 28.0 mmol/L   TCO2 24 22 - 32 mmol/L   O2 Saturation  79 %   Acid-Base Excess 0.0 0.0 - 2.0 mmol/L   Sodium 131 (L) 135 - 145 mmol/L   Potassium 4.9 3.5 - 5.1 mmol/L   Calcium, Ion 1.15 1.15 - 1.40 mmol/L   HCT 40.0 36.0 - 46.0 %   Hemoglobin 13.6 12.0 - 15.0 g/dL   Patient temperature 98.8 F    Collection site IV start    Drawn by Nurse    Sample type VENOUS   Urinalysis, Routine w reflex microscopic -Urine, Clean Catch     Status: Abnormal   Collection Time: 10/27/22  2:59 PM  Result Value Ref Range   Color, Urine YELLOW YELLOW   APPearance CLOUDY (A) CLEAR   Specific Gravity, Urine <=1.005 1.005 - 1.030   pH 5.0 5.0 - 8.0   Glucose, UA >=500 (A) NEGATIVE mg/dL   Hgb urine dipstick TRACE (A) NEGATIVE   Bilirubin Urine NEGATIVE NEGATIVE   Ketones, ur 15 (A) NEGATIVE mg/dL   Protein, ur NEGATIVE NEGATIVE mg/dL   Nitrite NEGATIVE NEGATIVE   Leukocytes,Ua TRACE (A) NEGATIVE    Comment: Performed at [facility], [address] [place], [place] [address]  Urinalysis, Microscopic (reflex)     Status: Abnormal   Collection Time: 10/27/22  2:59 PM  Result Value Ref Range   RBC / HPF 0-5 0 - 5 RBC/hpf   WBC, UA 21-50 0 - 5 WBC/hpf   Bacteria, UA RARE (A) NONE SEEN   Squamous Epithelial / HPF 0-5 0 - 5 /HPF    Comment: Performed at [facility], [address] [place], [place] [address]  Osmolality     Status: Abnormal   Collection Time: 10/27/22  4:15 PM  Result  Value Ref Range   Osmolality 332 (HH) 275 - 295 mOsm/kg    Comment: REPEATED TO VERIFY CRITICAL RESULT CALLED TO, READ BACK BY AND VERIFIED WITH: [facility]. [facility] RN 03.04.2024 1927 MTIU Performed at [place] [address], [place], [place] [id number]   Basic metabolic panel     Status: Abnormal   Collection Time: 10/27/22  4:15 PM  Result Value Ref Range   Sodium 132 (L) 135 - 145 mmol/L   Potassium 4.8 3.5 - 5.1 mmol/L   Chloride 97 (L) 98 - 111 mmol/L   CO2 25 22 - 32 mmol/L   Glucose, Bld 607 (HH) 70 - 99 mg/dL    Comment: CRITICAL RESULT CALLED TO, READ BACK BY AND VERIFIED WITH MARVA SIMMS RN AT 1701 ON 11/06/22 BY I.SUGUT Glucose reference range applies only to samples taken after fasting for at least 8 hours.    BUN 32 (H) 8 - 23 mg/dL   Creatinine, Ser 1.18 (H) 0.44 - 1.00 mg/dL   Calcium 9.4 8.9 - 10.3 mg/dL   GFR, Estimated 51 (L) >60 mL/min    Comment: (NOTE) Calculated using the CKD-EPI Creatinine Equation (2021)    Anion gap 10 5 - 15    Comment: Performed at [facility], [address] [place], [place] [address]  CBG monitoring, ED     Status: Abnormal   Collection Time: 10/27/22  4:18 PM  Result Value Ref Range   Glucose-Capillary 570 (HH) 70 - 99 mg/dL    Comment: Glucose reference range applies only to samples taken after fasting for at least 8 hours.  CBG monitoring, ED     Status: Abnormal   Collection Time: 10/27/22  4:54 PM  Result Value  Ref Range   Glucose-Capillary 469 (H) 70 - 99 mg/dL    Comment: Glucose reference range applies only to samples taken after fasting for at least 8 hours.  CBG monitoring, ED     Status: Abnormal   Collection Time: 10/27/22  5:30 PM  Result Value Ref Range   Glucose-Capillary 408 (H) 70 - 99 mg/dL    Comment: Glucose reference range applies only to samples taken after fasting for at least 8 hours.  CBG monitoring, ED     Status: Abnormal   Collection Time: 10/27/22  6:08 PM  Result Value Ref Range    Glucose-Capillary 368 (H) 70 - 99 mg/dL    Comment: Glucose reference range applies only to samples taken after fasting for at least 8 hours.  CBG monitoring, ED     Status: Abnormal   Collection Time: 10/27/22  7:08 PM  Result Value Ref Range   Glucose-Capillary 308 (H) 70 - 99 mg/dL    Comment: Glucose reference range applies only to samples taken after fasting for at least 8 hours.  CBG monitoring, ED     Status: Abnormal   Collection Time: 10/27/22  8:00 PM  Result Value Ref Range   Glucose-Capillary 274 (H) 70 - 99 mg/dL    Comment: Glucose reference range applies only to samples taken after fasting for at least 8 hours.  Glucose, capillary     Status: Abnormal   Collection Time: 10/27/22  8:59 PM  Result Value Ref Range   Glucose-Capillary 206 (H) 70 - 99 mg/dL    Comment: Glucose reference range applies only to samples taken after fasting for at least 8 hours.  MRSA Next Gen by PCR, Nasal     Status: None   Collection Time: 10/27/22  9:14 PM   Specimen: Nasal Mucosa; Nasal Swab  Result Value Ref Range   MRSA by PCR Next Gen NOT DETECTED NOT DETECTED    Comment: (NOTE) The GeneXpert MRSA Assay (FDA approved for NASAL specimens only), is one component of a comprehensive MRSA colonization surveillance program. It is not intended to diagnose MRSA infection nor to guide or monitor treatment for MRSA infections. Test performance is not FDA approved in patients less than 4 years old. Performed at [facility], [place] [address] [place], [place] [address]   Glucose, capillary     Status: Abnormal   Collection Time: 10/27/22 10:03 PM  Result Value Ref Range   Glucose-Capillary 206 (H) 70 - 99 mg/dL    Comment: Glucose reference range applies only to samples taken after fasting for at least 8 hours.  Glucose, capillary     Status: Abnormal   Collection Time: 10/27/22 11:04 PM  Result Value Ref Range   Glucose-Capillary 207 (H) 70 - 99 mg/dL    Comment: Glucose  reference range applies only to samples taken after fasting for at least 8 hours.  Basic metabolic panel     Status: Abnormal   Collection Time: 10/27/22 11:29 PM  Result Value Ref Range   Sodium 135 135 - 145 mmol/L   Potassium 4.2 3.5 - 5.1 mmol/L   Chloride 105 98 - 111 mmol/L   CO2 26 22 - 32 mmol/L   Glucose, Bld 228 (H) 70 - 99 mg/dL    Comment: Glucose reference range applies only to samples taken after fasting for at least 8 hours.   BUN 30 (H) 8 - 23 mg/dL   Creatinine, Ser 1.00 0.44 - 1.00 mg/dL  Calcium 8.9 8.9 - 10.3 mg/dL   GFR, Estimated >60 >60 mL/min    Comment: (NOTE) Calculated using the CKD-EPI Creatinine Equation (2021)    Anion gap 4 (L) 5 - 15    Comment: Performed at [facility], [place] [address], [place], [place] [address]  Hemoglobin A1c     Status: Abnormal   Collection Time: 10/27/22 11:29 PM  Result Value Ref Range   Hgb A1c MFr Bld 10.5 (H) 4.8 - 5.6 %    Comment: (NOTE)         Prediabetes: 5.7 - 6.4         Diabetes: >6.4         Glycemic control for adults with diabetes: <7.0    Mean Plasma Glucose 255 mg/dL    Comment: (NOTE) Performed At: [facility] [place], [place] [id number] Rush Farmer MD [id number]   Glucose, capillary     Status: Abnormal   Collection Time: 10/28/22 12:13 AM  Result Value Ref Range   Glucose-Capillary 253 (H) 70 - 99 mg/dL    Comment: Glucose reference range applies only to samples taken after fasting for at least 8 hours.  Glucose, capillary     Status: Abnormal   Collection Time: 10/28/22  1:04 AM  Result Value Ref Range   Glucose-Capillary 221 (H) 70 - 99 mg/dL    Comment: Glucose reference range applies only to samples taken after fasting for at least 8 hours.  Glucose, capillary     Status: Abnormal   Collection Time: 10/28/22  2:16 AM  Result Value Ref Range   Glucose-Capillary 198 (H) 70 - 99 mg/dL    Comment: Glucose reference range applies only to samples  taken after fasting for at least 8 hours.  Glucose, capillary     Status: Abnormal   Collection Time: 10/28/22  3:06 AM  Result Value Ref Range   Glucose-Capillary 198 (H) 70 - 99 mg/dL    Comment: Glucose reference range applies only to samples taken after fasting for at least 8 hours.  HIV Antibody (routine testing w rflx)     Status: None   Collection Time: 10/28/22  3:22 AM  Result Value Ref Range   HIV Screen 4th Generation wRfx Non Reactive Non Reactive    Comment: Performed at [facility], [place] [address] [place], [place] [id number]  Basic metabolic panel     Status: Abnormal   Collection Time: 10/28/22  3:22 AM  Result Value Ref Range   Sodium 139 135 - 145 mmol/L   Potassium 4.6 3.5 - 5.1 mmol/L   Chloride 106 98 - 111 mmol/L   CO2 27 22 - 32 mmol/L   Glucose, Bld 204 (H) 70 - 99 mg/dL    Comment: Glucose reference range applies only to samples taken after fasting for at least 8 hours.   BUN 27 (H) 8 - 23 mg/dL   Creatinine, Ser 0.98 0.44 - 1.00 mg/dL   Calcium 9.3 8.9 - 10.3 mg/dL   GFR, Estimated >60 >60 mL/min    Comment: (NOTE) Calculated using the CKD-EPI Creatinine Equation (2021)    Anion gap 6 5 - 15    Comment: Performed at [facility], [place] [address] [place], [place] [address]  Beta-hydroxybutyric acid     Status: None   Collection Time: 10/28/22  3:22 AM  Result Value Ref Range   Beta-Hydroxybutyric Acid 0.09 0.05 - 0.27 mmol/L    Comment:  Performed at [facility], [place] [address] [place], [place] [address]  Glucose, capillary     Status: Abnormal   Collection Time: 10/28/22  4:20 AM  Result Value Ref Range   Glucose-Capillary 159 (H) 70 - 99 mg/dL    Comment: Glucose reference range applies only to samples taken after fasting for at least 8 hours.  Glucose, capillary     Status: Abnormal   Collection Time: 10/28/22  5:06 AM  Result Value Ref Range   Glucose-Capillary 203 (H) 70 - 99 mg/dL    Comment:  Glucose reference range applies only to samples taken after fasting for at least 8 hours.  Glucose, capillary     Status: Abnormal   Collection Time: 10/28/22  5:32 AM  Result Value Ref Range   Glucose-Capillary 221 (H) 70 - 99 mg/dL    Comment: Glucose reference range applies only to samples taken after fasting for at least 8 hours.  Glucose, capillary     Status: Abnormal   Collection Time: 10/28/22  6:27 AM  Result Value Ref Range   Glucose-Capillary 247 (H) 70 - 99 mg/dL    Comment: Glucose reference range applies only to samples taken after fasting for at least 8 hours.  Basic metabolic panel     Status: Abnormal   Collection Time: 10/28/22  7:10 AM  Result Value Ref Range   Sodium 138 135 - 145 mmol/L   Potassium 4.1 3.5 - 5.1 mmol/L   Chloride 106 98 - 111 mmol/L   CO2 27 22 - 32 mmol/L   Glucose, Bld 229 (H) 70 - 99 mg/dL    Comment: Glucose reference range applies only to samples taken after fasting for at least 8 hours.   BUN 25 (H) 8 - 23 mg/dL   Creatinine, Ser 0.86 0.44 - 1.00 mg/dL   Calcium 8.9 8.9 - 10.3 mg/dL   GFR, Estimated >60 >60 mL/min    Comment: (NOTE) Calculated using the CKD-EPI Creatinine Equation (2021)    Anion gap 5 5 - 15    Comment: Performed at [facility], [place] [address], [place], [place] [address]  Glucose, capillary     Status: Abnormal   Collection Time: 10/28/22  8:02 AM  Result Value Ref Range   Glucose-Capillary 179 (H) 70 - 99 mg/dL    Comment: Glucose reference range applies only to samples taken after fasting for at least 8 hours.  Glucose, capillary     Status: Abnormal   Collection Time: 10/28/22  9:00 AM  Result Value Ref Range   Glucose-Capillary 177 (H) 70 - 99 mg/dL    Comment: Glucose reference range applies only to samples taken after fasting for at least 8 hours.  Glucose, capillary     Status: Abnormal   Collection Time: 10/28/22 10:04 AM  Result Value Ref Range   Glucose-Capillary 216 (H) 70 - 99 mg/dL     Comment: Glucose reference range applies only to samples taken after fasting for at least 8 hours.   Comment 1 Notify RN    Comment 2 Document in Chart   Basic metabolic panel     Status: Abnormal   Collection Time: 10/28/22 10:54 AM  Result Value Ref Range   Sodium 137 135 - 145 mmol/L   Potassium 4.1 3.5 - 5.1 mmol/L   Chloride 104 98 - 111 mmol/L   CO2 28 22 - 32 mmol/L   Glucose, Bld 284 (H) 70 - 99 mg/dL  Comment: Glucose reference range applies only to samples taken after fasting for at least 8 hours.   BUN 19 8 - 23 mg/dL   Creatinine, Ser 0.89 0.44 - 1.00 mg/dL   Calcium 8.9 8.9 - 10.3 mg/dL   GFR, Estimated >60 >60 mL/min    Comment: (NOTE) Calculated using the CKD-EPI Creatinine Equation (2021)    Anion gap 5 5 - 15    Comment: Performed at [facility], [place] [address] [place], [place] [address]  Glucose, capillary     Status: Abnormal   Collection Time: 10/28/22 11:50 AM  Result Value Ref Range   Glucose-Capillary 228 (H) 70 - 99 mg/dL    Comment: Glucose reference range applies only to samples taken after fasting for at least 8 hours.  Glucose, capillary     Status: Abnormal   Collection Time: 10/28/22  4:23 PM  Result Value Ref Range   Glucose-Capillary 252 (H) 70 - 99 mg/dL    Comment: Glucose reference range applies only to samples taken after fasting for at least 8 hours.  Glucose, capillary     Status: Abnormal   Collection Time: 10/28/22  8:31 PM  Result Value Ref Range   Glucose-Capillary 327 (H) 70 - 99 mg/dL    Comment: Glucose reference range applies only to samples taken after fasting for at least 8 hours.  Lipid panel     Status: Abnormal   Collection Time: 10/29/22  5:35 AM  Result Value Ref Range   Cholesterol 186 0 - 200 mg/dL   Triglycerides 129 <150 mg/dL   HDL 55 >40 mg/dL   Total CHOL/HDL Ratio 3.4 RATIO   VLDL 26 0 - 40 mg/dL   LDL Cholesterol 105 (H) 0 - 99 mg/dL    Comment:        Total Cholesterol/HDL:CHD  Risk Coronary Heart Disease Risk Table                     Men   Women  1/2 Average Risk   3.4   3.3  Average Risk       5.0   4.4  2 X Average Risk   9.6   7.1  3 X Average Risk  23.4   11.0        Use the calculated Patient Ratio above and the CHD Risk Table to determine the patient's CHD Risk.        ATP III CLASSIFICATION (LDL):  <100     mg/dL   Optimal  100-129  mg/dL   Near or Above                    Optimal  130-159  mg/dL   Borderline  160-189  mg/dL   High  >190     mg/dL   Very High Performed at [facility] [address], [place], [place] [id number]   Basic metabolic panel     Status: Abnormal   Collection Time: 10/29/22  5:35 AM  Result Value Ref Range   Sodium 136 135 - 145 mmol/L   Potassium 4.6 3.5 - 5.1 mmol/L   Chloride 105 98 - 111 mmol/L   CO2 25 22 - 32 mmol/L   Glucose, Bld 295 (H) 70 - 99 mg/dL    Comment: Glucose reference range applies only to samples taken after fasting for at least 8 hours.   BUN 23 8 - 23 mg/dL   Creatinine, Ser 0.88  0.44 - 1.00 mg/dL   Calcium 9.0 8.9 - 10.3 mg/dL   GFR, Estimated >60 >60 mL/min    Comment: (NOTE) Calculated using the CKD-EPI Creatinine Equation (2021)    Anion gap 6 5 - 15    Comment: Performed at [facility], [place] [address], [place], [place] [address]  Glucose, capillary     Status: Abnormal   Collection Time: 10/29/22  7:23 AM  Result Value Ref Range   Glucose-Capillary 271 (H) 70 - 99 mg/dL    Comment: Glucose reference range applies only to samples taken after fasting for at least 8 hours.   Comment 1 Notify RN   Glucose, capillary     Status: Abnormal   Collection Time: 10/29/22 11:43 AM  Result Value Ref Range   Glucose-Capillary 249 (H) 70 - 99 mg/dL    Comment: Glucose reference range applies only to samples taken after fasting for at least 8 hours.    MICRO: reviewed IMAGING: No results found.   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   Immunesuppression = no need for dose change from ID standpoint.  Will sign off.

## 2022-10-29 NOTE — Inpatient Diabetes Management (Addendum)
Inpatient Diabetes Program Recommendations  AACE/ADA: New Consensus Statement on Inpatient Glycemic Control (2015)  Target Ranges:  Prepandial:   less than 140 mg/dL      Peak postprandial:   less than 180 mg/dL (1-2 hours)      Critically ill patients:  140 - 180 mg/dL   Lab Results  Component Value Date   GLUCAP 271 (H) 10/29/2022   HGBA1C 10.5 (H) 10/27/2022    Review of Glycemic Control  Latest Reference Range & Units 10/28/22 20:31 10/29/22 07:23  Glucose-Capillary 70 - 99 mg/dL 327 (H) 271 (H)  (H): Data is abnormally high Diabetes history: New onset DM Outpatient Diabetes medications: none Current orders for Inpatient glycemic control: Semglee 15 units QD, Novolog 0-15 units TID & HS, Novolog 3 units TID   Inpatient Diabetes Program Recommendations:     Consider increasing Semglee to 25 units QD and increasing Novolog 6 units TID (assuming patient is consuming 50% of meals)  Spoke with patient again at length to reinforce concepts discussed yesterday regarding new onset DM.  Reviewed patient's current A1c of 10.5%. Explained what a A1c is and what it measures. Also reviewed goal A1c with patient, importance of good glucose control @ home, and blood sugar goals. Reviewed survival skills, patho of DM, need for insulin, differences between long acting vs short acting, vascular changes and commorbidities.  Patient will need a meter for discharge, see order number below. Patient also interested in freestyle [place]. Orders for application provided. Reviewed Freestyle libre 3 with patient, risks and benefits, application, cost per pharmacy, how to obtain additional sensors and when to reach out to MD. Applied to right upper arm.  Place consult for output education.  Educated patient on insulin pen use at home. Reviewed contents of insulin flexpen starter kit. Reviewed all steps if insulin pen including attachment of needle, 2-unit air shot, dialing up dose, giving injection, removing  needle, disposal of sharps, storage of unused insulin, disposal of insulin etc. Patient able to provide successful return demonstration. Also reviewed troubleshooting with insulin pen. MD to give patient Rxs for insulin pens and insulin pen needles. Preferred insulins per pharmacy: Lantus $35, Novolog 35$, Freestyle libre $80.  Patient has follow up appointment scheduled. All questions answered.   For discharge: [id number]- Lantus 28 units QD # [id number]- Novolog 8 units TID # "[id number]- glucose meter kit [id number]- insulin pen needles  Thanks, Bronson Curb, MSN, RNC-OB Diabetes Coordinator [phone] (8a-5p)

## 2022-10-31 LAB — VARICELLA-ZOSTER BY PCR: Varicella-Zoster, PCR: NEGATIVE

## 2022-12-09 NOTE — Progress Notes (Deleted)
[name] [address] [place] [address] Phone: [phone] Subjective:    I'm seeing this patient by the request  of:  [name], MD  CC:   [id number]  10/15/2022 Chronic problem with worsening symptoms, 4 months since the previous injection. Hopeful that this will last longer. Discussed icing regimen and home exercises, increase activity slowly. Worsening symptoms may need advanced imaging   Chronic, with worsening symptoms. Given injection. Did respond extremely well to this previously and hopefully will help again. Discussed with patient about icing regimen and home exercises, but likely irritated secondary to patient's other social determinants of health and recent surgery of the rotator cuff on the contralateral side. Follow-up again in 6 to 8 weeks   Updated 12/11/2022 Stefanny Pieri is a 67 y.o. 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    TOENAIL EXCISION Left 12/2020   2nd digit    TOTAL KNEE ARTHROPLASTY Left 07/27/2020   Procedure: LEFT TOTAL KNEE ARTHROPLASTY;  Surgeon: Kathryne Hitch, MD;  Location: [facility];  Service: Orthopedics;  Laterality: Left;   Social History   Socioeconomic History   Marital status: Married    Spouse name: Not on file   Number of children: Not on file   Years of education: Not on file   Highest education level: Not on file  Occupational History   Not on file  Tobacco Use   Smoking status: Never   Smokeless tobacco: Never  Vaping Use   Vaping Use: Never used  Substance and Sexual  Activity   Alcohol use: Never    Alcohol/week: 0.0 standard drinks of alcohol   Drug use: Never   Sexual activity: Not on file  Other Topics Concern   Not on file  Social History Narrative   Right handed   Two story home   Drinks caffeine   Social Determinants of Health   Financial Resource Strain: Not on file  Food Insecurity: No Food Insecurity (10/27/2022)   Hunger Vital Sign    Worried About Running Out of Food in the Last Year: Never true    Ran Out of Food in the Last Year: Never true  Transportation Needs: No Transportation Needs (10/27/2022)   PRAPARE - Administrator, Civil Service (Medical): No    Lack of Transportation (Non-Medical): No  Physical Activity: Not on file  Stress: Not on file  Social Connections: Not on file   Allergies  Allergen Reactions   Gabapentin Nausea And Vomiting   Meloxicam    Penicillins Rash   Family History  Problem Relation Age of Onset   Heart disease Mother    Healthy Brother     Current Outpatient Medications (Endocrine & Metabolic):    insulin aspart (NOVOLOG FLEXPEN) 100 UNIT/ML FlexPen, Inject 8 Units into the skin 3 (three) times daily with meals.   insulin glargine (LANTUS) 100 UNIT/ML Solostar Pen, Inject 28 Units into the skin daily.  Current Outpatient Medications (Cardiovascular):    hydrochlorothiazide (HYDRODIURIL) 25 MG  tablet, Take 12.5 mg by mouth every other day.   losartan (COZAAR) 50 MG tablet, Take 1 tablet (50 mg total) by mouth daily.   rosuvastatin (CRESTOR) 20 MG tablet, Take 20 mg by mouth daily.   Current Outpatient Medications (Analgesics):    Adalimumab (HUMIRA) 40 MG/0.4ML PSKT, Inject 40 mg into the skin every 14 (fourteen) days.    Current Outpatient Medications (Other):    azaTHIOprine (IMURAN) 50 MG tablet, Take 100 mg by mouth daily.   bimatoprost (LUMIGAN) 0.03 % ophthalmic solution, Place 1 drop into both eyes at bedtime.    Blood Glucose Monitoring Suppl DEVI, 1 each by Does not  apply route in the morning, at noon, and at bedtime. May substitute to any manufacturer covered by patient's insurance.   clobetasol ointment (TEMOVATE) 0.05 %, Apply 1 Application topically 2 (two) times daily as needed (alopecia).   COMBIGAN 0.2-0.5 % ophthalmic solution, Place 1 drop into both eyes 2 (two) times daily.    Glucose Blood (BLOOD GLUCOSE TEST STRIPS) STRP, 1 each by In Vitro route in the morning, at noon, and at bedtime. May substitute to any manufacturer covered by patient's insurance.   Insulin Pen Needle 30G X 5 MM MISC, 1 each by Does not apply route 3 (three) times daily.   Multiple Vitamin (MULTIVITAMIN PO), Take 1 tablet by mouth daily.    PENNSAID 2 % SOLN, APPLY 2 PUMPS TOPICALLY TO THE AFFECTED AREA(S) TWICE DAILY AS DIRECTED   tacrolimus (PROGRAF) 1 MG capsule, Take 2 mg by mouth 2 (two) times daily.    tacrolimus (PROTOPIC) 0.1 % ointment, Apply 1 Application topically 2 (two) times a week.   Travoprost, BAK Free, (TRAVATAN) 0.004 % SOLN ophthalmic solution, Place 1 drop into both eyes at bedtime.   Reviewed prior external information including notes and imaging from  primary care provider As well as notes that were available from care everywhere and other healthcare systems.  Past medical history, social, surgical and family history all reviewed in electronic medical record.  No pertanent information unless stated regarding to the chief complaint.   Review of Systems:  No headache, visual changes, nausea, vomiting, diarrhea, constipation, dizziness, abdominal pain, skin rash, fevers, chills, night sweats, weight loss, swollen lymph nodes, body aches, joint swelling, chest pain, shortness of breath, mood changes. POSITIVE muscle aches  Objective  There were no vitals taken for this visit.   General: No apparent distress alert and oriented x3 mood and affect normal, dressed appropriately.  HEENT: Pupils equal, extraocular movements intact  Respiratory: Patient's  speak in full sentences and does not appear short of breath  Cardiovascular: No lower extremity edema, non tender, no erythema      Impression and Recommendations:

## 2022-12-11 ENCOUNTER — Ambulatory Visit: Payer: 59 | Admitting: Family Medicine

## 2022-12-24 NOTE — Progress Notes (Signed)
[name] [address] [place] [address] Phone: [phone] Subjective:   [name], am serving as a scribe for Dr. Antoine Primas.  I'm seeing this patient by the request  of:  [name], MD  CC:   [id number]  10/15/2022 Chronic problem with worsening symptoms, 4 months since the previous injection. Hopeful that this will last longer. Discussed icing regimen and home exercises, increase activity slowly. Worsening symptoms may need advanced imaging   Chronic, with worsening symptoms. Given injection. Did respond extremely well to this previously and hopefully will help again. Discussed with patient about icing regimen and home exercises, but likely irritated secondary to patient's other social determinants of health and recent surgery of the rotator cuff on the contralateral side. Follow-up again in 6 to 8 weeks   Updated 12/25/2022 Dnylah Mathes is a 67 y.o. female coming in with complaint of shoulder and neck pain. Patient states that her pain increases at night and she is unable to sleep. Denies any numbness or tingling. Injections were helpful last visit.   L side of lumbar spine pain intermittent pain that is dull.     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    TOENAIL EXCISION Left 12/2020   2nd digit    TOTAL KNEE ARTHROPLASTY Left 07/27/2020   Procedure: LEFT TOTAL KNEE ARTHROPLASTY;  Surgeon: Kathryne Hitch, MD;  Location: [facility];  Service: Orthopedics;  Laterality: Left;   Social History   Socioeconomic History   Marital status: Married    Spouse name: Not on file   Number of children: Not on file   Years of education: Not on file   Highest education level: Not on file   Occupational History   Not on file  Tobacco Use   Smoking status: Never   Smokeless tobacco: Never  Vaping Use   Vaping Use: Never used  Substance and Sexual Activity   Alcohol use: Never    Alcohol/week: 0.0 standard drinks of alcohol   Drug use: Never   Sexual activity: Not on file  Other Topics Concern   Not on file  Social History Narrative   Right handed   Two story home   Drinks caffeine   Social Determinants of Health   Financial Resource Strain: Not on file  Food Insecurity: No Food Insecurity (10/27/2022)   Hunger Vital Sign    Worried About Running Out of Food in the Last Year: Never true    Ran Out of Food in the Last Year: Never true  Transportation Needs: No Transportation Needs (10/27/2022)   PRAPARE - Administrator, Civil Service (Medical): No    Lack of Transportation (Non-Medical): No  Physical Activity: Not on file  Stress: Not on file  Social Connections: Not on file   Allergies  Allergen Reactions   Gabapentin Nausea And Vomiting   Meloxicam    Penicillins Rash   Family History  Problem Relation Age of Onset   Heart disease Mother    Healthy Brother     Current Outpatient Medications (Endocrine & Metabolic):    insulin aspart (NOVOLOG FLEXPEN) 100 UNIT/ML FlexPen, Inject 8 Units into the  skin 3 (three) times daily with meals.   insulin glargine (LANTUS) 100 UNIT/ML Solostar Pen, Inject 28 Units into the skin daily.  Current Outpatient Medications (Cardiovascular):    hydrochlorothiazide (HYDRODIURIL) 25 MG tablet, Take 12.5 mg by mouth every other day.   losartan (COZAAR) 50 MG tablet, Take 1 tablet (50 mg total) by mouth daily.   rosuvastatin (CRESTOR) 20 MG tablet, Take 20 mg by mouth daily.   Current Outpatient Medications (Analgesics):    Adalimumab (HUMIRA) 40 MG/0.4ML PSKT, Inject 40 mg into the skin every 14 (fourteen) days.    Current Outpatient Medications (Other):    azaTHIOprine (IMURAN) 50 MG tablet, Take 100 mg by  mouth daily.   bimatoprost (LUMIGAN) 0.03 % ophthalmic solution, Place 1 drop into both eyes at bedtime.    Blood Glucose Monitoring Suppl DEVI, 1 each by Does not apply route in the morning, at noon, and at bedtime. May substitute to any manufacturer covered by patient's insurance.   clobetasol ointment (TEMOVATE) 0.05 %, Apply 1 Application topically 2 (two) times daily as needed (alopecia).   COMBIGAN 0.2-0.5 % ophthalmic solution, Place 1 drop into both eyes 2 (two) times daily.    Glucose Blood (BLOOD GLUCOSE TEST STRIPS) STRP, 1 each by In Vitro route in the morning, at noon, and at bedtime. May substitute to any manufacturer covered by patient's insurance.   Insulin Pen Needle 30G X 5 MM MISC, 1 each by Does not apply route 3 (three) times daily.   Multiple Vitamin (MULTIVITAMIN PO), Take 1 tablet by mouth daily.    PENNSAID 2 % SOLN, APPLY 2 PUMPS TOPICALLY TO THE AFFECTED AREA(S) TWICE DAILY AS DIRECTED   tacrolimus (PROGRAF) 1 MG capsule, Take 2 mg by mouth 2 (two) times daily.    tacrolimus (PROTOPIC) 0.1 % ointment, Apply 1 Application topically 2 (two) times a week.   Travoprost, BAK Free, (TRAVATAN) 0.004 % SOLN ophthalmic solution, Place 1 drop into both eyes at bedtime.   Reviewed prior external information including notes and imaging from  primary care provider As well as notes that were available from care everywhere and other healthcare systems.  Reviewed patient's hospitalization for hypoglycemia.  Past medical history, social, surgical and family history all reviewed in electronic medical record.  No pertanent information unless stated regarding to the chief complaint.   Review of Systems:  No headache, visual changes, nausea, vomiting, diarrhea, constipation, dizziness, abdominal pain, skin rash, fevers, chills, night sweats, weight loss, swollen lymph nodes, body aches, joint swelling, chest pain, shortness of breath, mood changes. POSITIVE muscle aches  Objective   Blood pressure 116/74, pulse 74, height 5\' 3"  (1.6 m), weight 225 lb (102.1 kg), SpO2 98 %.   General: No apparent distress alert and oriented x3 mood and affect normal, dressed appropriately.  HEENT: Pupils equal, extraocular movements intact  Respiratory: Patient's speak in full sentences and does not appear short of breath  Cardiovascular: No lower extremity edema, non tender, no erythema  , Left shoulder exam does have good range of motion noted.  Still has a positive crossover noted.  Limited muscular skeletal ultrasound was performed and interpreted by Antoine Primas, M  Limited ultrasound shows there is hypoechoic changes of the rotator cuff but no true tear appreciated.  Goes with more of a tendinitis.  Patient does have significant hypoechoic changes noted of the acromioclavicular joint.    Impression and Recommendations:     The above documentation has been reviewed and is accurate and complete  Judi Saa, DO

## 2022-12-25 ENCOUNTER — Other Ambulatory Visit: Payer: Self-pay

## 2022-12-25 ENCOUNTER — Encounter: Payer: Self-pay | Admitting: Family Medicine

## 2022-12-25 ENCOUNTER — Ambulatory Visit: Payer: 59 | Admitting: Family Medicine

## 2022-12-25 VITALS — BP 116/74 | HR 74 | Ht 63.0 in | Wt 225.0 lb

## 2022-12-25 DIAGNOSIS — G8929 Other chronic pain: Secondary | ICD-10-CM

## 2022-12-25 DIAGNOSIS — M25512 Pain in left shoulder: Secondary | ICD-10-CM

## 2022-12-25 DIAGNOSIS — M19012 Primary osteoarthritis, left shoulder: Secondary | ICD-10-CM

## 2022-12-25 NOTE — Patient Instructions (Signed)
Good to see you! Sorry we couldn't make the pain go away today Will put you in at 11am tomorrow for injection if Endocrinology says it's okay

## 2022-12-25 NOTE — Assessment & Plan Note (Signed)
Seems to have significant arthritic changes.  Has responded to the injections previously.  Will hold though at this point with patient seeing endocrinology tomorrow.  We discussed that I did not want to change any of the laboratory workup that could be done initially.  Depending on how patient responds and what endocrinology says it is then we will consider having patient back to have an injection here in the acromioclavicular joint.  Patient understands and will call us if anything else changes.

## 2022-12-26 ENCOUNTER — Ambulatory Visit: Payer: Self-pay | Admitting: Family Medicine

## 2022-12-30 ENCOUNTER — Encounter: Payer: 59 | Attending: Internal Medicine | Admitting: Dietician

## 2022-12-30 ENCOUNTER — Encounter: Payer: Self-pay | Admitting: Dietician

## 2022-12-30 DIAGNOSIS — E875 Hyperkalemia: Secondary | ICD-10-CM | POA: Diagnosis present

## 2022-12-30 NOTE — Progress Notes (Signed)
Medical Nutrition Therapy  Appointment Start time:  [id number]  Appointment End time:  1655  Primary concerns today: Pt is concerned about what food she should be eating.   Referral diagnosis: hyperkalemia Preferred learning style: no preference indicated Learning readiness: ready   NUTRITION ASSESSMENT   Anthropometrics  Ht: 63 in Wt: 222 lbs  Clinical Medical Hx: arthritis, HLD, HTN, type 2 diabetes Medications: insulin lantus (20 units)  Labs: 10/27/22 A1c 10.5% Notable Signs/Symptoms: constipation: pt states sometimes she goes every 2-3 days. Food Allergies: none reported  Lifestyle & Dietary Hx  Pt is checking her blood sugar multiple times per day. Pt has Freestyle Libre 3 CGM that she brought to this visit to set up.   Pt states she used to walk around the track but she got 2 hip replacements and can no longer walk how she used to so she does youtube workouts at home.    Pt states she was on Lantus and Novolog but she got off Novolog last week and now is only on 20 units Lantus daily.   Pt states she occasionally gets constipation and does not have a bowel movement for 2-3 days.   Estimated daily fluid intake: 80 oz Supplements: MVI Sleep: 4-5 hours of sleep Stress / self-care: moderate stress Current average weekly physical activity: 3-4 days per week for 30 minutes.   24-Hr Dietary Recall First Meal: vanilla greek yogurt with blueberries Snack: none Second Meal: [place] burger with green beans and carrots OR grilled chicken and blueberries and cabbage Snack: none Third Meal: grilled fish with spinach Snack: none Beverages: coffee with half and half and monkfruit, 80 oz water, crystal light with water   NUTRITION DIAGNOSIS  [place]-2.2 Altered nutrition-related laboratory As related to type 2 diabetes.  As evidenced by A1c 10.5%.   NUTRITION INTERVENTION  Nutrition education (E-1) on the following topics:  Helped pt set up Freestyle Libre 3 CGM, aided in sensor  application, app download process, and reader setup.  Discussed diabetes portion plate including to aim to fill half your plate with non-starchy vegetables. Divide the remaining half between lean protein and whole grains. Encouraged pt to limit refined carbs and sugars: Minimize sugary drinks, sweets, and processed foods. Choose whole fruits over fruit juices and opt for whole grains instead of refined grains. Be consistent: Establish regular eating patterns by spacing meals evenly throughout the day. Avoid skipping meals, especially breakfast. Stay active: Along with a healthy diet, regular physical activity is crucial for managing type 2 diabetes. Aim for at least 30 minutes of moderate-intensity exercise most days of the week. Discussed blood sugar ranges and goals for fasting and post-prandial blood glucose.  Explained insulin resistance, A1c, and type 2 diabetes pathophysiology.  Handouts Provided Include  Meal Ideas Snack Suggestions ADA How to Thrive: A Guide for Your Journey with Diabetes  Learning Style & Readiness for Change Teaching method utilized: Visual & Auditory  Demonstrated degree of understanding via: Teach Back  Barriers to learning/adherence to lifestyle change: none  Goals Established by Pt Aim for 150 minutes of physical activity weekly. Goal: Exercise 4-5 days a week for 30 minutes.   Goal: aim to make your plate look like the Diabetes Portion Plate at least once a day. (1/4 plate protein, 1/4 plate complex carbs, 1/2 plate vegetables).   Aim to eat within 1-2 hours of waking up and every 3-5 hours following. Avoid skipping meals.   When snacking, aim to include a complex carb and protein.  At meals, aim to include 1/4 plate of complex carbs, 1/4 plate protein, 1/2 plate non-starchy vegetables.   MONITORING & EVALUATION Dietary intake, weekly physical activity, and follow up in 3 months.  Next Steps  Patient is to call for questions.

## 2022-12-30 NOTE — Patient Instructions (Signed)
Aim for 150 minutes of physical activity weekly. Goal: Exercise 4-5 days a week for 30 minutes.   Goal: aim to make your plate look like the Diabetes Portion Plate at least once a day. (1/4 plate protein, 1/4 plate complex carbs, 1/2 plate vegetables).   Aim to eat within 1-2 hours of waking up and every 3-5 hours following. Avoid skipping meals.   When snacking, aim to include a complex carb and protein.  At meals, aim to include 1/4 plate of complex carbs, 1/4 plate protein, 1/2 plate non-starchy vegetables.

## 2023-04-01 ENCOUNTER — Encounter: Payer: 59 | Attending: Internal Medicine | Admitting: Dietician

## 2023-04-01 ENCOUNTER — Encounter: Payer: Self-pay | Admitting: Dietician

## 2023-04-01 DIAGNOSIS — E875 Hyperkalemia: Secondary | ICD-10-CM | POA: Insufficient documentation

## 2023-04-01 NOTE — Patient Instructions (Signed)
Goal: Exercise 4-5 days a week for 30 minutes.   Goal: aim to make your plate look like the Diabetes Portion Plate at least once a day. (1/4 plate protein, 1/4 plate complex carbs, 1/2 plate vegetables).   Aim to eat within 1-2 hours of waking up and every 3-5 hours following. Avoid skipping meals.   When snacking, aim to include a complex carb and protein.  Blood glucose ranges: Fasting: 80-130mg /dL 2 hours after meal: 180mg /dL

## 2023-04-01 NOTE — Progress Notes (Signed)
Medical Nutrition Therapy  Appointment Start time:  1620  Appointment End time:  1645  Primary concerns today: Pt is concerned about what food she should be eating.   Referral diagnosis: hyperkalemia Preferred learning style: no preference indicated Learning readiness: ready   NUTRITION ASSESSMENT   Anthropometrics  Ht: 63 in Wt: 04/01/23: 208 lbs Wt: 12/30/22: 222 lbs  Clinical Medical Hx: arthritis, HLD, HTN, type 2 diabetes Medications: ozempic Labs: A1c 5.9% on 03/02/23. Notable Signs/Symptoms: constipation: pt states sometimes she goes every 2-3 days. Food Allergies: none reported  Lifestyle & Dietary Hx  Pt states her A1c went down from 10.5% in March, to 5.9% on 03/02/23.  Pt states she's been doing more walking. She states she has been going to the gym and was going 2 times per week, but now goes 4 times a week. She states she also does walk away the pounds at home and sometimes does a short workout at home in the evenings.    Pt states she is off lantus and is on ozempic now. She states she still has a CGM.   Pt states sometimes her sugar is 69 at night and she treats it with a few ounces of pepsi. She usually does not have a carbohydrate source with dinner.   Estimated daily fluid intake: 120 oz Supplements: MVI Sleep: 4-5 hours of sleep Stress / self-care: moderate stress Current average weekly physical activity: 3-4 days per week for 30 minutes.   24-Hr Dietary Recall First Meal: vanilla greek yogurt with blueberries Snack: none Second Meal: grilled chicken, blueberries, salad Snack: none Third Meal: baked fish with broccoli.  Snack: none Beverages: coffee with half and half and monkfruit,120 oz water, crystal light with water   NUTRITION DIAGNOSIS  [place]-2.2 Altered nutrition-related laboratory As related to type 2 diabetes.  As evidenced by A1c 10.5%.   NUTRITION INTERVENTION  Nutrition education (E-1) on the following topics:  Discussed diabetes portion  plate including to aim to fill half your plate with non-starchy vegetables. Divide the remaining half between lean protein and whole grains. Educated on importance of consistent meal times: Establish regular eating patterns by spacing meals evenly throughout the day. Avoid skipping meals, especially breakfast. Educated pt on the importance of including a protein and complex carbohydrate source at meals, especially with dinner since pt has been going low some nights.  Discussed treatment of hypoglycemia: the 15-15 rule.  Discussed blood sugar ranges and goals for fasting and post-prandial blood glucose.   Handouts Provided Include  Blood glucose ranges and goals printed  Learning Style & Readiness for Change Teaching method utilized: Visual & Auditory  Demonstrated degree of understanding via: Teach Back  Barriers to learning/adherence to lifestyle change: none  Goals Established by Pt  Pt would like to continue with all previous goals:   Goal: Exercise 4-5 days a week for 30 minutes. - goal met, continue  Goal: aim to make your plate look like the Diabetes Portion Plate at least once a day. (1/4 plate protein, 1/4 plate complex carbs, 1/2 plate vegetables). - goal met, continue  Aim to eat within 1-2 hours of waking up and every 3-5 hours following. Avoid skipping meals.   When snacking, aim to include a complex carb and protein.  Blood glucose ranges: Fasting: 80-130mg /dL 2 hours after meal: 180mg /dL  MONITORING & EVALUATION Dietary intake, weekly physical activity, and follow up in 3 months.  Next Steps  Patient is to call for questions.

## 2023-04-28 ENCOUNTER — Ambulatory Visit: Payer: 59 | Admitting: Sports Medicine

## 2023-04-28 VITALS — BP 122/80 | Ht 63.0 in | Wt 225.0 lb

## 2023-04-28 DIAGNOSIS — G8929 Other chronic pain: Secondary | ICD-10-CM

## 2023-04-28 DIAGNOSIS — M25512 Pain in left shoulder: Secondary | ICD-10-CM | POA: Diagnosis not present

## 2023-04-28 DIAGNOSIS — M19012 Primary osteoarthritis, left shoulder: Secondary | ICD-10-CM | POA: Diagnosis not present

## 2023-04-28 DIAGNOSIS — M7552 Bursitis of left shoulder: Secondary | ICD-10-CM | POA: Diagnosis not present

## 2023-04-28 NOTE — Patient Instructions (Signed)
Shoulder injection Tylenol as needed See me in 3 to 4 weeks

## 2023-04-28 NOTE — Progress Notes (Signed)
Olivia Barnes D.[name] [address] [place] [address] Phone: [phone]   Assessment and Plan:    1. Chronic left shoulder pain 2. Arthritis of left acromioclavicular joint 3. Acute bursitis of left shoulder  -Chronic with exacerbation, subsequent sports medicine visit - Recurrent left shoulder pain over the past several weeks most consistent with subacromial bursitis based on patient's broad pain with all motions.  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  Tolerated well per note below - May use Tylenol for day-to-day pain relief - Start HEP to prevent loss of ROM in shoulder  Procedure: Subacromial Injection Side: Left  Risks explained and consent was given verbally. The site was cleaned with alcohol prep. A steroid injection was performed from posterior approach using 2mL of 1% lidocaine without epinephrine and 1mL of kenalog 40mg /ml. This was well tolerated and resulted in symptomatic relief.  Needle was removed, hemostasis achieved, and post injection instructions were explained.   Pt was advised to call or return to clinic if these symptoms worsen or fail to improve as anticipated.   15 additional minutes spent for educating Therapeutic Home Exercise Program.  This included exercises focusing on stretching, strengthening, with focus on eccentric aspects.   Long term goals include an improvement in range of motion, strength, endurance as well as avoiding reinjury. Patient's frequency would include in 1-2 times a day, 3-5 times a week for a duration of 6-12 weeks. Proper technique shown and discussed handout in great detail with ATC.  All questions were discussed and answered.    Pertinent previous records reviewed include none   Follow Up: 3 to 4  weeks for reevaluation.  If no improvement or worsening of symptoms, could consider intra-articular glenohumeral versus AC joint CSI   Subjective:    Chief Complaint: L shoulder pain  HPI:  Seen in May 2024 for L shoulder pain. Injections were helpful but were not given last visit with Dr. Katrinka Barnes due to f/u with endocrinology the next day  04/28/23 Patient states that her L shoulder pain started 2 weeks ago. No new injury. Pain is constant and sharp at times. 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 Pain over anterior aspect.   Relevant Historical Information: Hypertension,  Additional pertinent review of systems negative.   Current Outpatient Medications:    Adalimumab (HUMIRA) 40 MG/0.4ML PSKT, Inject 40 mg into the skin every 14 (fourteen) days. , Disp: , Rfl:    azaTHIOprine (IMURAN) 50 MG tablet, Take 100 mg by mouth daily., Disp: , Rfl:    bimatoprost (LUMIGAN) 0.03 % ophthalmic solution, Place 1 drop into both eyes at bedtime. , Disp: , Rfl:    Blood Glucose Monitoring Suppl DEVI, 1 each by Does not apply route in the morning, at noon, and at bedtime. May substitute to any manufacturer covered by patient's insurance., Disp: 1 each, Rfl: 0   clobetasol ointment (TEMOVATE) 0.05 %, Apply 1 Application topically 2 (two) times daily as needed (alopecia)., Disp: , Rfl:    COMBIGAN 0.2-0.5 % ophthalmic solution, Place 1 drop into both eyes 2 (two) times daily. , Disp: , Rfl:    Glucose Blood (BLOOD GLUCOSE TEST STRIPS) STRP, 1 each by In Vitro route in the morning, at noon, and at bedtime. May substitute to any  manufacturer covered by [employer]., Disp: 100 strip, Rfl: 0   hydrochlorothiazide (HYDRODIURIL) 25 MG tablet, Take 12.5 mg by mouth every other day., Disp: , Rfl:    insulin aspart (NOVOLOG FLEXPEN) 100 UNIT/ML FlexPen, Inject 8 Units into the skin 3 (three) times daily with meals., Disp: 15 mL, Rfl: 1   insulin glargine (LANTUS) 100 UNIT/ML Solostar Pen, Inject 28  Units into the skin daily., Disp: 15 mL, Rfl: 1   Insulin Pen Needle 30G X 5 MM MISC, 1 each by Does not apply route 3 (three) times daily., Disp: 100 each, Rfl: 1   losartan (COZAAR) 50 MG tablet, Take 1 tablet (50 mg total) by mouth daily., Disp: 30 tablet, Rfl: 1   Multiple Vitamin (MULTIVITAMIN PO), Take 1 tablet by mouth daily. , Disp: , Rfl:    PENNSAID 2 % SOLN, APPLY 2 PUMPS TOPICALLY TO THE AFFECTED AREA(S) TWICE DAILY AS DIRECTED, Disp: 112 g, Rfl: 3   rosuvastatin (CRESTOR) 20 MG tablet, Take 20 mg by mouth daily., Disp: , Rfl:    tacrolimus (PROGRAF) 1 MG capsule, Take 2 mg by mouth 2 (two) times daily. , Disp: , Rfl:    tacrolimus (PROTOPIC) 0.1 % ointment, Apply 1 Application topically 2 (two) times a week., Disp: , Rfl:    Travoprost, BAK Free, (TRAVATAN) 0.004 % SOLN ophthalmic solution, Place 1 drop into both eyes at bedtime., Disp: , Rfl:    Objective:     Vitals:   04/28/23 1545  BP: 122/80  Weight: 225 lb (102.1 kg)  Height: 5\' 3"  (1.6 m)      Body mass index is 39.86 kg/m.    Physical Exam:    Gen: Appears well, nad, nontoxic and pleasant Neuro:sensation intact, strength is 5/5 with df/pf/inv/ev, muscle tone wnl Skin: no suspicious lesion or defmority Psych: A&O, appropriate mood and affect  Left shoulder:  No deformity, swelling or muscle wasting No scapular winging FF 90, abd  80, int 20, ext 70 TTP mildly globally over shoulder   Equivocal special testing limited by patient's ROM Neg ant drawer, sulcus sign, apprehension Negative Spurling's test bilat FROM of neck    Electronically signed by:  Olivia Barnes D.[name] 4:06 PM 04/28/23

## 2023-05-05 ENCOUNTER — Other Ambulatory Visit: Payer: Self-pay

## 2023-05-05 ENCOUNTER — Emergency Department (HOSPITAL_COMMUNITY)
Admission: EM | Admit: 2023-05-05 | Discharge: 2023-05-05 | Disposition: A | Discharge status: Home / Self Care | Payer: 59 | Attending: Emergency Medicine | Admitting: Emergency Medicine

## 2023-05-05 ENCOUNTER — Emergency Department (HOSPITAL_COMMUNITY): Payer: 59

## 2023-05-05 ENCOUNTER — Encounter (HOSPITAL_COMMUNITY): Payer: Self-pay | Admitting: Emergency Medicine

## 2023-05-05 DIAGNOSIS — R079 Chest pain, unspecified: Secondary | ICD-10-CM | POA: Diagnosis present

## 2023-05-05 DIAGNOSIS — R0789 Other chest pain: Secondary | ICD-10-CM | POA: Insufficient documentation

## 2023-05-05 DIAGNOSIS — M25512 Pain in left shoulder: Secondary | ICD-10-CM | POA: Insufficient documentation

## 2023-05-05 DIAGNOSIS — M542 Cervicalgia: Secondary | ICD-10-CM | POA: Diagnosis not present

## 2023-05-05 LAB — BASIC METABOLIC PANEL
Anion gap: 8 (ref 5–15)
BUN: 32 mg/dL — ABNORMAL HIGH (ref 8–23)
CO2: 25 mmol/L (ref 22–32)
Calcium: 9.2 mg/dL (ref 8.9–10.3)
Chloride: 105 mmol/L (ref 98–111)
Creatinine, Ser: 1.06 mg/dL — ABNORMAL HIGH (ref 0.44–1.00)
GFR, Estimated: 58 mL/min — ABNORMAL LOW (ref >60)
Glucose, Bld: 131 mg/dL — ABNORMAL HIGH (ref 70–99)
Potassium: 3.9 mmol/L (ref 3.5–5.1)
Sodium: 138 mmol/L (ref 135–145)

## 2023-05-05 LAB — TROPONIN I (HIGH SENSITIVITY)
Troponin I (High Sensitivity): 3 ng/L (ref <18)
Troponin I (High Sensitivity): 3 ng/L (ref <18)

## 2023-05-05 LAB — CBC
HCT: 36.4 % (ref 36.0–46.0)
Hemoglobin: 11.9 g/dL — ABNORMAL LOW (ref 12.0–15.0)
MCH: 31.1 pg (ref 26.0–34.0)
MCHC: 32.7 g/dL (ref 30.0–36.0)
MCV: 95 fL (ref 80.0–100.0)
Platelets: 164 10*3/uL (ref 150–400)
RBC: 3.83 MIL/uL — ABNORMAL LOW (ref 3.87–5.11)
RDW: 17.1 % — ABNORMAL HIGH (ref 11.5–15.5)
WBC: 6.3 10*3/uL (ref 4.0–10.5)
nRBC: 0 % (ref 0.0–0.2)

## 2023-05-05 MED ORDER — HYDROCODONE-ACETAMINOPHEN 5-325 MG PO TABS
1.0000 | ORAL_TABLET | Freq: Once | ORAL | Status: AC
  Administered 2023-05-05: 1 via ORAL
  Filled 2023-05-05: qty 1

## 2023-05-05 NOTE — ED Provider Triage Note (Signed)
Emergency Medicine Provider Triage Evaluation Note  Olivia Barnes , a 67 y.o. female  was evaluated in triage.  Pt complains of chest pain, began while at rest.  Review of Systems  Positive: Chest pain Negative: Dyspnea  Physical Exam  BP 134/69 (BP Location: Left Leg)   Pulse 63   Temp 98.2 F (36.8 C) (Oral)   Resp 18   SpO2 100%  Gen:   Awake, no distress speaking clearly Resp:  Normal effort no increased work of breathing MSK:   Moves extremities without difficulty no deformity Other:  Neuro intact  Medical Decision Making  Medically screening exam initiated at 3:39 PM.  Appropriate orders placed.  Olivia Barnes was informed that the remainder of the evaluation will be completed by another provider, this initial triage assessment does not replace that evaluation, and the importance of remaining in the ED until their evaluation is complete.   Gerhard Munch, MD 05/05/23 1540

## 2023-05-05 NOTE — ED Provider Notes (Signed)
[place] EMERGENCY DEPARTMENT AT [facility] Provider Note   CSN: [id number] Arrival date & time: 05/05/23  1411     History  Chief Complaint  Patient presents with   Chest Pain    Olivia Barnes is a 67 y.o. female.  Patient reports that she was sitting at work and began having pain in the left side of her chest left neck and left shoulder.  Patient reports that she recently had an injection to her left shoulder by [place] [facility].  Patient reports that she has the pain in her chest when she moves her left arm or moves her body.  Patient reports that she tried taking a nitroglycerin and aspirin and pain was unchanged.  Patient reports that he is not having any pain when she is not moving.  Patient denies any other complaints she has not had any cough or congestion she denies any fever or chills.  Patient denies any abdominal pain.   Chest Pain      Home Medications Prior to Admission medications   Medication Sig Start Date End Date Taking? Authorizing Provider  Adalimumab (HUMIRA) 40 MG/0.4ML PSKT Inject 40 mg into the skin every 14 (fourteen) days.     [provider]  azaTHIOprine (IMURAN) 50 MG tablet Take 100 mg by mouth daily. 07/11/22   [provider]  bimatoprost (LUMIGAN) 0.03 % ophthalmic solution Place 1 drop into both eyes at bedtime.     [provider]  Blood Glucose Monitoring Suppl DEVI 1 each by Does not apply route in the morning, at noon, and at bedtime. May substitute to any manufacturer covered by patient's insurance. 10/29/22   Burnadette Pop, MD  clobetasol ointment (TEMOVATE) 0.05 % Apply 1 Application topically 2 (two) times daily as needed (alopecia). 02/06/22   [provider]  COMBIGAN 0.2-0.5 % ophthalmic solution Place 1 drop into both eyes 2 (two) times daily.  03/26/20   [provider]  Glucose Blood (BLOOD GLUCOSE TEST STRIPS) STRP 1 each by In Vitro route in the morning, at noon, and at  bedtime. May substitute to any manufacturer covered by patient's insurance. 10/29/22   Burnadette Pop, MD  hydrochlorothiazide (HYDRODIURIL) 25 MG tablet Take 12.5 mg by mouth every other day. 12/01/16   [provider]  insulin aspart (NOVOLOG FLEXPEN) 100 UNIT/ML FlexPen Inject 8 Units into the skin 3 (three) times daily with meals. 10/29/22   Burnadette Pop, MD  insulin glargine (LANTUS) 100 UNIT/ML Solostar Pen Inject 28 Units into the skin daily. 10/29/22   Burnadette Pop, MD  Insulin Pen Needle 30G X 5 MM MISC 1 each by Does not apply route 3 (three) times daily. 10/29/22   Burnadette Pop, MD  losartan (COZAAR) 50 MG tablet Take 1 tablet (50 mg total) by mouth daily. 10/30/22   Burnadette Pop, MD  Multiple Vitamin (MULTIVITAMIN PO) Take 1 tablet by mouth daily.     [provider]  PENNSAID 2 % SOLN APPLY 2 PUMPS TOPICALLY TO THE AFFECTED AREA(S) TWICE DAILY AS DIRECTED 02/12/21   Judi Saa, DO  rosuvastatin (CRESTOR) 20 MG tablet Take 20 mg by mouth daily. 03/06/20   [provider]  tacrolimus (PROGRAF) 1 MG capsule Take 2 mg by mouth 2 (two) times daily.     [provider]  tacrolimus (PROTOPIC) 0.1 % ointment Apply 1 Application topically 2 (two) times a week. 02/06/22   [provider]  Travoprost, BAK Free, (TRAVATAN) 0.004 % SOLN  ophthalmic solution Place 1 drop into both eyes at bedtime. 07/11/22   [provider]      Allergies    Gabapentin, Meloxicam, and Penicillins    Review of Systems   Review of Systems  Cardiovascular:  Positive for chest pain.  All other systems reviewed and are negative.   Physical Exam Updated Vital Signs BP (!) 167/76 (BP Location: Right Arm)   Pulse 68   Temp 98.1 F (36.7 C) (Oral)   Resp 18   SpO2 100%  Physical Exam Vitals and nursing note reviewed.  Constitutional:      Appearance: She is well-developed.  HENT:     Head: Normocephalic.  Cardiovascular:     Rate and Rhythm: Normal  rate and regular rhythm.     Heart sounds: Normal heart sounds.  Pulmonary:     Effort: Pulmonary effort is normal.     Breath sounds: Normal breath sounds.  Abdominal:     General: There is no distension.     Palpations: Abdomen is soft.  Musculoskeletal:        General: Normal range of motion.     Cervical back: Normal range of motion.  Skin:    General: Skin is warm.  Neurological:     Mental Status: She is alert and oriented to person, place, and time.     ED Results / Procedures / Treatments   Labs (all labs ordered are listed, but only abnormal results are displayed) Labs Reviewed  BASIC METABOLIC PANEL - Abnormal; Notable for the following components:      Result Value   Glucose, Bld 131 (*)    BUN 32 (*)    Creatinine, Ser 1.06 (*)    GFR, Estimated 58 (*)    All other components within normal limits  CBC - Abnormal; Notable for the following components:   RBC 3.83 (*)    Hemoglobin 11.9 (*)    RDW 17.1 (*)    All other components within normal limits  TROPONIN I (HIGH SENSITIVITY)  TROPONIN I (HIGH SENSITIVITY)    EKG EKG Interpretation Date/Time:  Tuesday May 05 2023 13:50:25 EDT Ventricular Rate:  62 PR Interval:  186 QRS Duration:  76 QT Interval:  404 QTC Calculation: 410 R Axis:   38  Text Interpretation: Normal sinus rhythm Normal ECG When compared with ECG of 27-Oct-2022 16:07, PREVIOUS ECG IS PRESENT Confirmed by Ernie Avena ([id number]) on 05/05/2023 8:59:49 PM  Radiology DG Chest 2 View  Result Date: 05/05/2023 CLINICAL DATA:  Chest pain EXAM: CHEST - 2 VIEW COMPARISON:  October 28, 2019 FINDINGS: The cardiomediastinal silhouette is unchanged in contour. No pleural effusion. No pneumothorax. No acute pleuroparenchymal abnormality. Visualized abdomen is unremarkable. Multilevel degenerative changes of the thoracic spine. IMPRESSION: No acute cardiopulmonary abnormality. Electronically Signed   By: Meda Klinefelter M.D.   On: 05/05/2023 16:40     Procedures Procedures    Medications Ordered in ED Medications  HYDROcodone-acetaminophen (NORCO/VICODIN) 5-325 MG per tablet 1 tablet (1 tablet Oral Given 05/05/23 2245)    ED Course/ Medical Decision Making/ A&P                                 Medical Decision Making Amount and/or Complexity of Data Reviewed Independent Historian: spouse    Details: Patient is here with her spouse who is supportive External Data Reviewed: notes.    Details: [facility] notes and sports  medicine notes reviewed Labs: ordered. Decision-making details documented in ED Course.    Details: Opponent is negative x 2 Radiology: ordered and independent interpretation performed. Decision-making details documented in ED Course.    Details: Asked x-ray shows no acute changes ECG/medicine tests: ordered and independent interpretation performed. Decision-making details documented in ED Course.    Details: KG shows a normal sinus no acute changes  Risk Prescription drug management. Risk Details: Patient's pain seems to be musculoskeletal it is reproducible when she sits up and when I move her left arm.  Patient is given a dose of hydrocodone here patient is advised to follow-up with her primary care physician for recheck she should return to the emergency department if symptoms worsen or change.           Final Clinical Impression(s) / ED Diagnoses Final diagnoses:  Chest wall pain    Rx / DC Orders ED Discharge Orders     None      An After Visit Summary was printed and given to the patient.    Elson Areas, Cordelia Poche 05/05/23 2348    Ernie Avena, MD 05/06/23 1159

## 2023-05-05 NOTE — ED Triage Notes (Signed)
Pt reports while at work she was sitting down and began to have left sided chest pain. Pt was given 1 nitroglycerin and 324 asa with no relief.

## 2023-05-27 ENCOUNTER — Ambulatory Visit: Payer: 59 | Admitting: Family Medicine

## 2023-06-02 NOTE — Progress Notes (Addendum)
[facility] [address] [place] [address] Phone: [phone] Subjective:   Olivia Barnes, am serving as a scribe for Dr. Antoine Primas.  I'm seeing this patient by the request  of:  Layne Benton., MD  CC: left shoulder pain   [id number]  12/25/2022 Seems to have significant arthritic changes.  Has responded to the injections previously.  Will hold though at this point with patient seeing endocrinology tomorrow.  We discussed that I did not want to change any of the laboratory workup that could be done initially.  Depending on how patient responds and what endocrinology says it is then we will consider having patient back to have an injection here in the acromioclavicular joint.  Patient understands and will call us if anything else changes      Update 06/09/2023 Olivia Barnes is a 67 y.o. female coming in with complaint of L shoulder pain. Patient states that the back of her shoulder is now bothering her. Painful to sleep on that side. Front of shoulder is doing better.        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    TOENAIL EXCISION Left 12/2020   2nd digit    TOTAL KNEE ARTHROPLASTY Left 07/27/2020   Procedure: LEFT TOTAL KNEE ARTHROPLASTY;  Surgeon: Kathryne Hitch, MD;  Location: [facility];  Service: Orthopedics;  Laterality: Left;   Social History   Socioeconomic History   Marital status: Married    Spouse name: Not on file   Number of children: Not on file   Years of education: Not on file   Highest education level: Not on file  Occupational History   Not on file  Tobacco Use   Smoking status: Never   Smokeless tobacco: Never  Vaping Use   Vaping status: Never Used  Substance  and Sexual Activity   Alcohol use: Never    Alcohol/week: 0.0 standard drinks of alcohol   Drug use: Never   Sexual activity: Not on file  Other Topics Concern   Not on file  Social History Narrative   Right handed   Two story home   Drinks caffeine   Social Determinants of Health   Financial Resource Strain: Not on file  Food Insecurity: Low Risk  (03/02/2023)   Received from [facility]   Hunger Vital Sign    Worried About Running Out of Food in the Last Year: Never true    Ran Out of Food in the Last Year: Never true  Transportation Needs: Not on file (03/02/2023)  Physical Activity: Not on file  Stress: Not on file  Social Connections: Not on file   Allergies  Allergen Reactions   Gabapentin Nausea And Vomiting   Meloxicam    Penicillins Rash   Family History  Problem Relation Age of Onset   Heart disease Mother    Healthy Brother     Current Outpatient Medications (Endocrine & Metabolic):    insulin aspart (NOVOLOG FLEXPEN) 100 UNIT/ML FlexPen, Inject 8 Units into the skin 3 (three) times daily with meals.   insulin glargine (LANTUS) 100 UNIT/ML Solostar Pen, Inject 28 Units into the skin daily.  Current  Outpatient Medications (Cardiovascular):    hydrochlorothiazide (HYDRODIURIL) 25 MG tablet, Take 12.5 mg by mouth every other day.   losartan (COZAAR) 50 MG tablet, Take 1 tablet (50 mg total) by mouth daily.   rosuvastatin (CRESTOR) 20 MG tablet, Take 20 mg by mouth daily.   Current Outpatient Medications (Analgesics):    Adalimumab (HUMIRA) 40 MG/0.4ML PSKT, Inject 40 mg into the skin every 14 (fourteen) days.    Current Outpatient Medications (Other):    azaTHIOprine (IMURAN) 50 MG tablet, Take 100 mg by mouth daily.   bimatoprost (LUMIGAN) 0.03 % ophthalmic solution, Place 1 drop into both eyes at bedtime.    Blood Glucose Monitoring Suppl DEVI, 1 each by Does not apply route in the morning, at noon, and at bedtime. May substitute to any manufacturer  covered by patient's insurance.   clobetasol ointment (TEMOVATE) 0.05 %, Apply 1 Application topically 2 (two) times daily as needed (alopecia).   COMBIGAN 0.2-0.5 % ophthalmic solution, Place 1 drop into both eyes 2 (two) times daily.    Glucose Blood (BLOOD GLUCOSE TEST STRIPS) STRP, 1 each by In Vitro route in the morning, at noon, and at bedtime. May substitute to any manufacturer covered by patient's insurance.   Insulin Pen Needle 30G X 5 MM MISC, 1 each by Does not apply route 3 (three) times daily.   Multiple Vitamin (MULTIVITAMIN PO), Take 1 tablet by mouth daily.    PENNSAID 2 % SOLN, APPLY 2 PUMPS TOPICALLY TO THE AFFECTED AREA(S) TWICE DAILY AS DIRECTED   tacrolimus (PROGRAF) 1 MG capsule, Take 2 mg by mouth 2 (two) times daily.    tacrolimus (PROTOPIC) 0.1 % ointment, Apply 1 Application topically 2 (two) times a week.   Travoprost, BAK Free, (TRAVATAN) 0.004 % SOLN ophthalmic solution, Place 1 drop into both eyes at bedtime.   Reviewed prior external information including notes and imaging from  primary care provider As well as notes that were available from care everywhere and other healthcare systems.  Past medical history, social, surgical and family history all reviewed in electronic medical record.  No pertanent information unless stated regarding to the chief complaint.   Review of Systems:  No headache, visual changes, nausea, vomiting, diarrhea, constipation, dizziness, abdominal pain, skin rash, fevers, chills, night sweats, weight loss, swollen lymph nodes, body aches, joint swelling, chest pain, shortness of breath, mood changes. POSITIVE muscle aches  Objective  Blood pressure 114/78, pulse 74, height 5\' 3"  (1.6 m), weight 225 lb (102.1 kg), SpO2 96%.   General: No apparent distress alert and oriented x3 mood and affect normal, dressed appropriately.  HEENT: Pupils equal, extraocular movements intact  Respiratory: Patient's speak in full sentences and does not  appear short of breath  Cardiovascular: No lower extremity edema, non tender, no erythema  Antalgic gait still noted.  Patient Left shoulder does have some limited range of motion noted.  Some of the rotator cuff.  The only rotation of the knees.  Procedure: Real-time Ultrasound Guided Injection of left glenohumeral joint Device: GE Logiq E  Ultrasound guided injection is preferred based studies that show increased duration, increased effect, greater accuracy, decreased procedural pain, increased response rate with ultrasound guided versus blind injection.  Verbal informed consent obtained.  Time-out conducted.  Noted no overlying erythema, induration, or other signs of local infection.  Skin prepped in a sterile fashion.  Local anesthesia: Topical Ethyl chloride.  With sterile technique and under real time ultrasound guidance:  Joint visualized.  21g 2  inch needle inserted posterior approach. Pictures taken for needle placement. Patient did have injection of 2 cc of 0.5% Marcaine, and 1cc of Kenalog 40 mg/dL. Completed without difficulty  Advised to call if fevers/chills, erythema, induration, drainage, or persistent bleeding.  Impression: Technically successful ultrasound guided injection.  Procedure: Real-time Ultrasound Guided Injection of left ac joint Device: GE Logiq Q7 Ultrasound guided injection is preferred based studies that show increased duration, increased effect, greater accuracy, decreased procedural pain, increased response rate, and decreased cost with ultrasound guided versus blind injection.  Verbal informed consent obtained.  Time-out conducted.  Noted no overlying erythema, induration, or other signs of local infection.  Skin prepped in a sterile fashion.  Local anesthesia: Topical Ethyl chloride.  With sterile technique and under real time ultrasound guidance: 25-gauge half inch needle injected with 0.5 cc of 0.5% Marcaine 0.5 cc of Kenalog 40 mg/mL Completed without  difficulty  Pain immediately resolved suggesting accurate placement of the medication.  Advised to call if fevers/chills, erythema, induration, drainage, or persistent bleeding.  Impression: Technically successful ultrasound guided injection.    Impression and Recommendations:    The above documentation has been reviewed and is accurate and complete Judi Saa, DO

## 2023-06-09 ENCOUNTER — Ambulatory Visit: Payer: 59 | Admitting: Family Medicine

## 2023-06-09 ENCOUNTER — Other Ambulatory Visit: Payer: Self-pay

## 2023-06-09 ENCOUNTER — Encounter: Payer: Self-pay | Admitting: Family Medicine

## 2023-06-09 VITALS — BP 114/78 | HR 74 | Ht 63.0 in | Wt 225.0 lb

## 2023-06-09 DIAGNOSIS — M19012 Primary osteoarthritis, left shoulder: Secondary | ICD-10-CM | POA: Diagnosis not present

## 2023-06-09 DIAGNOSIS — M25512 Pain in left shoulder: Secondary | ICD-10-CM | POA: Diagnosis not present

## 2023-06-09 DIAGNOSIS — M12812 Other specific arthropathies, not elsewhere classified, left shoulder: Secondary | ICD-10-CM | POA: Insufficient documentation

## 2023-06-09 NOTE — Assessment & Plan Note (Signed)
Chronic problem with worsening symptoms again.  Continuing to get worse over the course of time.  Increase activity slowly otherwise.  Follow-up with me again in 6 to 8 weeks otherwise.

## 2023-06-09 NOTE — Patient Instructions (Signed)
Injections in shoulder today Good to see you! Thank you for all that you have done See you again in 3 months

## 2023-06-09 NOTE — Assessment & Plan Note (Signed)
Patient given injection and tolerated the procedure well.  Discussed icing regimen and home exercises, discussed with patient that this may need potential other interventions if this continues to give her difficulty but hopeful will make significant improvement.  Follow-up with me again 3 to 4 months

## 2023-07-16 ENCOUNTER — Ambulatory Visit: Payer: 59 | Admitting: Dietician

## 2023-09-09 ENCOUNTER — Ambulatory Visit: Payer: 59 | Admitting: Family Medicine

## 2023-09-10 ENCOUNTER — Ambulatory Visit: Payer: 59 | Admitting: Dietician

## 2023-10-04 ENCOUNTER — Encounter (HOSPITAL_BASED_OUTPATIENT_CLINIC_OR_DEPARTMENT_OTHER): Payer: Self-pay

## 2023-10-04 ENCOUNTER — Other Ambulatory Visit: Payer: Self-pay

## 2023-10-04 ENCOUNTER — Emergency Department (HOSPITAL_BASED_OUTPATIENT_CLINIC_OR_DEPARTMENT_OTHER): Payer: 59

## 2023-10-04 ENCOUNTER — Emergency Department (HOSPITAL_BASED_OUTPATIENT_CLINIC_OR_DEPARTMENT_OTHER)
Admission: EM | Admit: 2023-10-04 | Discharge: 2023-10-04 | Disposition: A | Discharge status: Home / Self Care | Payer: 59 | Attending: Emergency Medicine | Admitting: Emergency Medicine

## 2023-10-04 DIAGNOSIS — Z79899 Other long term (current) drug therapy: Secondary | ICD-10-CM | POA: Diagnosis not present

## 2023-10-04 DIAGNOSIS — I10 Essential (primary) hypertension: Secondary | ICD-10-CM | POA: Insufficient documentation

## 2023-10-04 DIAGNOSIS — J0110 Acute frontal sinusitis, unspecified: Secondary | ICD-10-CM | POA: Diagnosis not present

## 2023-10-04 DIAGNOSIS — B338 Other specified viral diseases: Secondary | ICD-10-CM

## 2023-10-04 DIAGNOSIS — B974 Respiratory syncytial virus as the cause of diseases classified elsewhere: Secondary | ICD-10-CM | POA: Insufficient documentation

## 2023-10-04 DIAGNOSIS — Z794 Long term (current) use of insulin: Secondary | ICD-10-CM | POA: Insufficient documentation

## 2023-10-04 DIAGNOSIS — R531 Weakness: Secondary | ICD-10-CM | POA: Diagnosis present

## 2023-10-04 DIAGNOSIS — Z1152 Encounter for screening for COVID-19: Secondary | ICD-10-CM | POA: Insufficient documentation

## 2023-10-04 LAB — COMPREHENSIVE METABOLIC PANEL
ALT: 16 U/L (ref 0–44)
AST: 29 U/L (ref 15–41)
Albumin: 3.4 g/dL — ABNORMAL LOW (ref 3.5–5.0)
Alkaline Phosphatase: 52 U/L (ref 38–126)
Anion gap: 9 (ref 5–15)
BUN: 18 mg/dL (ref 8–23)
CO2: 26 mmol/L (ref 22–32)
Calcium: 9.3 mg/dL (ref 8.9–10.3)
Chloride: 102 mmol/L (ref 98–111)
Creatinine, Ser: 0.96 mg/dL (ref 0.44–1.00)
GFR, Estimated: >60 mL/min (ref >60)
Glucose, Bld: 94 mg/dL (ref 70–99)
Potassium: 4.4 mmol/L (ref 3.5–5.1)
Sodium: 137 mmol/L (ref 135–145)
Total Bilirubin: 1.1 mg/dL (ref 0.0–1.2)
Total Protein: 7 g/dL (ref 6.5–8.1)

## 2023-10-04 LAB — CBC
HCT: 43.7 % (ref 36.0–46.0)
Hemoglobin: 14.7 g/dL (ref 12.0–15.0)
MCH: 32.2 pg (ref 26.0–34.0)
MCHC: 33.6 g/dL (ref 30.0–36.0)
MCV: 95.6 fL (ref 80.0–100.0)
Platelets: 172 10*3/uL (ref 150–400)
RBC: 4.57 MIL/uL (ref 3.87–5.11)
RDW: 13.7 % (ref 11.5–15.5)
WBC: 6.5 10*3/uL (ref 4.0–10.5)
nRBC: 0 % (ref 0.0–0.2)

## 2023-10-04 LAB — RESP PANEL BY RT-PCR (RSV, FLU A&B, COVID)  RVPGX2
Influenza A by PCR: NEGATIVE
Influenza B by PCR: NEGATIVE
Resp Syncytial Virus by PCR: POSITIVE — AB
SARS Coronavirus 2 by RT PCR: NEGATIVE

## 2023-10-04 LAB — CBG MONITORING, ED: Glucose-Capillary: 104 mg/dL — ABNORMAL HIGH (ref 70–99)

## 2023-10-04 MED ORDER — ONDANSETRON HCL 4 MG/2ML IJ SOLN
4.0000 mg | Freq: Once | INTRAMUSCULAR | Status: AC
  Administered 2023-10-04: 4 mg via INTRAVENOUS
  Filled 2023-10-04: qty 2

## 2023-10-04 MED ORDER — ONDANSETRON HCL 4 MG PO TABS: 4.0000 mg | ORAL_TABLET | Freq: Three times a day (TID) | ORAL | 0 refills | Status: AC | PRN

## 2023-10-04 MED ORDER — SODIUM CHLORIDE 0.9 % IV BOLUS
1000.0000 mL | Freq: Once | INTRAVENOUS | Status: AC
  Administered 2023-10-04: 1000 mL via INTRAVENOUS

## 2023-10-04 MED ORDER — CEFUROXIME AXETIL 500 MG PO TABS: 500.0000 mg | ORAL_TABLET | Freq: Two times a day (BID) | ORAL | 0 refills | Status: AC

## 2023-10-04 NOTE — Discharge Instructions (Addendum)
 You were seen in the ER for weakness.  As we discussed, your test for RSV was positive.  This is a viral infection.  There is no one treatment for it, but we do try to help with the symptoms.  We gave you IV fluids and nausea medication.  I am prescribing you some nausea medication for home, as well as some antibiotics as I think that you are developing a bacterial sinus infection as well.  Make sure that you are drinking plenty of fluids.  You can take ibuprofen and/or Tylenol  as needed for pain.  You can take up to 1000 mg of Tylenol  every 6 hours, and up to 800 mg of ibuprofen every 6 hours.  Continue to monitor how you're doing and return to the ER for new or worsening symptoms.

## 2023-10-04 NOTE — ED Provider Notes (Signed)
 [facility] Provider Note   CSN: [id number] Arrival date & time: 10/04/23  0803     History  Chief Complaint  Patient presents with   weakness    Olivia Barnes is a 68 y.o. female with history of hyperlipidemia, hypertension, who presents the emergency department complaining of chills, nasal congestion, fatigue, headache, and nonproductive cough for the past 6 days.  She also started vomiting last night.  States that she is not sure how many times she threw up, but she was throwing up from the hours of 1 PM to 10 PM.  She was able to eat a cracker last night, but overall has not had much of an appetite.  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  She was seen at urgent care on the first or second day of illness and was told she had a sinus infection.  Was given prednisone  and a cough syrup.  She has not been taking anything else besides that.  HPI     Home Medications Prior to Admission medications   Medication Sig Start Date End Date Taking? Authorizing Provider  cefUROXime  (CEFTIN ) 500 MG tablet Take 1 tablet (500 mg total) by mouth 2 (two) times daily with a meal for 7 days. 10/04/23 10/11/23 Yes Batya Citron T, PA-C  ondansetron  (ZOFRAN ) 4 MG tablet Take 1 tablet (4 mg total) by mouth every 8 (eight) hours as needed for nausea or vomiting. 10/04/23  Yes Darla Mcdonald T, PA-C  Adalimumab (HUMIRA) 40 MG/0.4ML PSKT Inject 40 mg into the skin every 14 (fourteen) days.     [provider]  azaTHIOprine  (IMURAN ) 50 MG tablet Take 100 mg by mouth daily. 07/11/22   [provider]  bimatoprost (LUMIGAN) 0.03 % ophthalmic solution Place 1 drop into both eyes at bedtime.     [provider]  Blood Glucose Monitoring Suppl DEVI 1 each by Does not apply route in the morning, at noon, and at bedtime. May substitute to any manufacturer covered by patient's insurance. 10/29/22   Adhikari, Amrit, MD  clobetasol ointment  (TEMOVATE) 0.05 % Apply 1 Application topically 2 (two) times daily as needed (alopecia). 02/06/22   [provider]  COMBIGAN  0.2-0.5 % ophthalmic solution Place 1 drop into both eyes 2 (two) times daily.  03/26/20   [provider]  Glucose Blood (BLOOD GLUCOSE TEST STRIPS) STRP 1 each by In Vitro route in the morning, at noon, and at bedtime. May substitute to any manufacturer covered by patient's insurance. 10/29/22   Jillian Buttery, MD  hydrochlorothiazide  (HYDRODIURIL ) 25 MG tablet Take 12.5 mg by mouth every other day. 12/01/16   [provider]  insulin  aspart (NOVOLOG  FLEXPEN) 100 UNIT/ML FlexPen Inject 8 Units into the skin 3 (three) times daily with meals. 10/29/22   Jillian Buttery, MD  insulin  glargine (LANTUS ) 100 UNIT/ML Solostar Pen Inject 28 Units into the skin daily. 10/29/22   Jillian Buttery, MD  Insulin  Pen Needle 30G X 5 MM MISC 1 each by Does not apply route 3 (three) times daily. 10/29/22   Jillian Buttery, MD  losartan  (COZAAR ) 50 MG tablet Take 1 tablet (50 mg total) by mouth daily. 10/30/22   Adhikari, Amrit, MD  Multiple Vitamin (MULTIVITAMIN PO) Take 1 tablet by mouth daily.     [provider]  PENNSAID  2 % SOLN APPLY 2 PUMPS TOPICALLY TO THE AFFECTED AREA(S) TWICE DAILY AS DIRECTED 02/12/21   Claudene Arthea HERO, DO  rosuvastatin  (  CRESTOR ) 20 MG tablet Take 20 mg by mouth daily. 03/06/20   [provider]  tacrolimus  (PROGRAF ) 1 MG capsule Take 2 mg by mouth 2 (two) times daily.     [provider]  tacrolimus  (PROTOPIC ) 0.1 % ointment Apply 1 Application topically 2 (two) times a week. 02/06/22   [provider]  Travoprost, BAK Free, (TRAVATAN) 0.004 % SOLN ophthalmic solution Place 1 drop into both eyes at bedtime. 07/11/22   [provider]      Allergies    Gabapentin , Meloxicam , and Penicillins    Review of Systems   Review of Systems  Constitutional:  Positive for chills and fever.  HENT:  Positive for  congestion and sinus pressure.   Respiratory:  Positive for cough.   Gastrointestinal:  Positive for nausea and vomiting.  Neurological:  Positive for headaches.  All other systems reviewed and are negative.   Physical Exam Updated Vital Signs BP 132/76 (BP Location: Left Arm)   Pulse 78   Temp 97.9 F (36.6 C) (Oral)   Resp 18   Ht 5' 3 (1.6 m)   Wt 86.2 kg   SpO2 100%   BMI 33.66 kg/m  Physical Exam Vitals and nursing note reviewed.  Constitutional:      Appearance: Normal appearance.  HENT:     Head: Normocephalic and atraumatic.     Nose: Congestion present.  Eyes:     Conjunctiva/sclera: Conjunctivae normal.  Cardiovascular:     Rate and Rhythm: Normal rate and regular rhythm.  Pulmonary:     Effort: Pulmonary effort is normal. No respiratory distress.     Breath sounds: Normal breath sounds.  Abdominal:     General: There is no distension.     Palpations: Abdomen is soft.     Tenderness: There is no abdominal tenderness.  Skin:    General: Skin is warm and dry.  Neurological:     General: No focal deficit present.     Mental Status: She is alert.     ED Results / Procedures / Treatments   Labs (all labs ordered are listed, but only abnormal results are displayed) Labs Reviewed  RESP PANEL BY RT-PCR (RSV, FLU A&B, COVID)  [id number] - Abnormal; Notable for the following components:      Result Value   Resp Syncytial Virus by PCR POSITIVE (*)    All other components within normal limits  COMPREHENSIVE METABOLIC PANEL - Abnormal; Notable for the following components:   Albumin 3.4 (*)    All other components within normal limits  CBG MONITORING, ED - Abnormal; Notable for the following components:   Glucose-Capillary 104 (*)    All other components within normal limits  CBC    EKG None  Radiology DG Chest 2 View Result Date: 10/04/2023 CLINICAL DATA:  Cough for the past week. EXAM: CHEST - 2 VIEW COMPARISON:  Chest x-ray dated May 05, 2023.  FINDINGS: The heart size and mediastinal contours are within normal limits. Both lungs are clear. The visualized skeletal structures are unremarkable. IMPRESSION: No active cardiopulmonary disease. Electronically Signed   By: Elsie ONEIDA Shoulder M.D.   On: 10/04/2023 10:50    Procedures Procedures    Medications Ordered in ED Medications  sodium chloride  0.9 % bolus 1,000 mL (1,000 mLs Intravenous New Bag/Given 10/04/23 1026)  ondansetron  (ZOFRAN ) injection 4 mg (4 mg Intravenous Given 10/04/23 1026)    ED Course/ Medical Decision Making/ A&P  Medical Decision Making This patient is a 68 y.o. female  who presents to the ED for concern of generalized weakness, sinus congestion x 6 days.   Differential diagnoses prior to evaluation: The emergent differential diagnosis includes, but is not limited to,  upper respiratory infection, lower respiratory infection, allergies, asthma, irritants, sinus/esophageal foreign body, interstitial lung disease. This is not an exhaustive differential.   Past Medical History / Co-morbidities / Social History: hyperlipidemia, hypertension  Additional history: Chart reviewed. Pertinent results include: cannot review UC note from last week  Physical Exam: Physical exam performed. The pertinent findings include: Normal vitals, no acute distress.  Normal respiratory effort, lung sounds clear. Normal oxygen saturation on room air.   Lab Tests/Imaging studies: I personally interpreted labs/imaging and the pertinent results include: CBC and CMP normal.  Respiratory panel positive for RSV.  Chest X-ray without acute abnormalities. I agree with the radiologist interpretation.  Medications: I ordered medication including IVF, zofran .  I have reviewed the patients home medicines and have made adjustments as needed.   Disposition: After consideration of the diagnostic results and the patients response to treatment, I feel that emergency  department workup does not suggest an emergent condition requiring admission or immediate intervention beyond what has been performed at this time. The plan is: Discharge to home.  Suspect that symptoms related to RSV infection, however patient has had sinus congestion and pressure that is worsening over 6 days.  Will give some antibiotics for developing bacterial sinusitis (pt with PCN allergy but no documented cephalosporin allergy), as well as Zofran  as needed for nausea. The patient is safe for discharge and has been instructed to return immediately for worsening symptoms, change in symptoms or any other concerns.  Final Clinical Impression(s) / ED Diagnoses Final diagnoses:  RSV (respiratory syncytial virus infection)  Acute non-recurrent frontal sinusitis    Rx / DC Orders ED Discharge Orders          Ordered    ondansetron  (ZOFRAN ) 4 MG tablet  Every 8 hours PRN        10/04/23 1106    cefUROXime  (CEFTIN ) 500 MG tablet  2 times daily with meals        10/04/23 1106           Portions of this report may have been transcribed using voice recognition software. Every effort was made to ensure accuracy; however, inadvertent computerized transcription errors may be present.    Hillary Schwegler T, PA-C 10/04/23 1107    Kingsley, Victoria K, DO 10/04/23 1324

## 2023-10-04 NOTE — ED Triage Notes (Signed)
 Pt reports that she has been having cold and hot flashes. States that she feels bad and weak. Reports decrease in appetite. Pt reports that she has cough and congestion.

## 2023-12-23 NOTE — Progress Notes (Signed)
 [facility] [address] [place] [address] Phone: [phone] Subjective:   Olivia Barnes, am serving as a scribe for Dr. Ronnell Coins.  I'm seeing this patient by the request  of:  Euell Herrlich., MD  CC: Right hip and left shoulder pain  FAO:[id number]  Olivia Barnes is a 68 y.o. female coming in with complaint of R hip and L shoulder pain. Last seen in October 2024 for shoulder pain. Hx of B hip replacements. Patient states that her R hip and L shoulder pain increased over past 2 months. Pain in piriformis but does not have radiating symptoms.     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    TOENAIL EXCISION Left 12/2020   2nd digit    TOTAL KNEE ARTHROPLASTY Left 07/27/2020   Procedure: LEFT TOTAL KNEE ARTHROPLASTY;  Surgeon: Arnie Lao, MD;  Location: [facility];  Service: Orthopedics;  Laterality: Left;   Social History   Socioeconomic History   Marital status: Married    Spouse name: Not on file   Number of children: Not on file   Years of education: Not on file   Highest education level: Not on file  Occupational History   Not on file  Tobacco Use   Smoking status: Never   Smokeless tobacco: Never  Vaping Use   Vaping status: Never Used  Substance and Sexual Activity   Alcohol use: Never    Alcohol/week: 0.0 standard drinks of alcohol   Drug use: Never   Sexual activity: Not on file  Other Topics Concern   Not on file  Social History Narrative   Right handed   Two story home   Drinks caffeine   Social Drivers of Health   Financial Resource Strain: Not on file  Food Insecurity: Low Risk  (12/15/2023)   Received from [facility]   Hunger Vital Sign    Worried About  Running Out of Food in the Last Year: Never true    Ran Out of Food in the Last Year: Never true  Transportation Needs: No Transportation Needs (12/15/2023)   Received from [facility]    In the past 12 months, has lack of reliable transportation kept you from medical appointments, meetings, work or from getting things needed for daily living? : No  Physical Activity: Not on file  Stress: Not on file  Social Connections: Not on file   Allergies  Allergen Reactions   Gabapentin  Nausea And Vomiting   Meloxicam     Penicillins Rash   Family History  Problem Relation Age of Onset   Heart disease Mother    Healthy Brother     Current Outpatient Medications (Endocrine & Metabolic):    insulin  aspart (NOVOLOG  FLEXPEN) 100 UNIT/ML FlexPen, Inject 8 Units into the skin 3 (three) times daily with meals.   insulin  glargine (LANTUS ) 100 UNIT/ML Solostar Pen, Inject 28 Units into the skin daily.  Current Outpatient Medications (Cardiovascular):    hydrochlorothiazide  (HYDRODIURIL ) 25 MG tablet, Take 12.5 mg by mouth every other day.   losartan  (COZAAR ) 50 MG tablet, Take 1 tablet (50 mg total) by mouth daily.   rosuvastatin  (  CRESTOR ) 20 MG tablet, Take 20 mg by mouth daily.   Current Outpatient Medications (Analgesics):    Adalimumab (HUMIRA) 40 MG/0.4ML PSKT, Inject 40 mg into the skin every 14 (fourteen) days.    Current Outpatient Medications (Other):    azaTHIOprine  (IMURAN ) 50 MG tablet, Take 100 mg by mouth daily.   bimatoprost (LUMIGAN) 0.03 % ophthalmic solution, Place 1 drop into both eyes at bedtime.    Blood Glucose Monitoring Suppl DEVI, 1 each by Does not apply route in the morning, at noon, and at bedtime. May substitute to any manufacturer covered by patient's insurance.   clobetasol ointment (TEMOVATE) 0.05 %, Apply 1 Application topically 2 (two) times daily as needed (alopecia).   COMBIGAN  0.2-0.5 % ophthalmic solution, Place 1 drop into both eyes 2  (two) times daily.    Glucose Blood (BLOOD GLUCOSE TEST STRIPS) STRP, 1 each by In Vitro route in the morning, at noon, and at bedtime. May substitute to any manufacturer covered by patient's insurance.   Insulin  Pen Needle 30G X 5 MM MISC, 1 each by Does not apply route 3 (three) times daily.   Multiple Vitamin (MULTIVITAMIN PO), Take 1 tablet by mouth daily.    ondansetron  (ZOFRAN ) 4 MG tablet, Take 1 tablet (4 mg total) by mouth every 8 (eight) hours as needed for nausea or vomiting.   PENNSAID  2 % SOLN, APPLY 2 PUMPS TOPICALLY TO THE AFFECTED AREA(S) TWICE DAILY AS DIRECTED   tacrolimus  (PROGRAF ) 1 MG capsule, Take 2 mg by mouth 2 (two) times daily.    tacrolimus  (PROTOPIC ) 0.1 % ointment, Apply 1 Application topically 2 (two) times a week.   Travoprost, BAK Free, (TRAVATAN) 0.004 % SOLN ophthalmic solution, Place 1 drop into both eyes at bedtime.   Reviewed prior external information including notes and imaging from  primary care provider As well as notes that were available from care everywhere and other healthcare systems.  Past medical history, social, surgical and family history all reviewed in electronic medical record.  No pertanent information unless stated regarding to the chief complaint.   Review of Systems:  No headache, visual changes, nausea, vomiting, diarrhea, constipation, dizziness, abdominal pain, skin rash, fevers, chills, night sweats, weight loss, swollen lymph nodes, body aches, joint swelling, chest pain, shortness of breath, mood changes. POSITIVE muscle aches  Objective  Blood pressure 122/72, height 5\' 3"  (1.6 m), weight 184 lb (83.5 kg).   General: No apparent distress alert and oriented x3 mood and affect normal, dressed appropriately.  HEENT: Pupils equal, extraocular movements intact  Respiratory: Patient's speak in full sentences and does not appear short of breath  Cardiovascular: No lower extremity edema, non tender, no erythema  Antalgic gait noted.   Still having pain with FABER test.  Patient is severely tender to palpation over the greater trochanteric area. Left shoulder does have some crepitus noted.  4 out of 5 strength but symmetric to the contralateral side.  Procedure: Real-time Ultrasound Guided Injection of left glenohumeral joint Device: GE Logiq E  Ultrasound guided injection is preferred based studies that show increased duration, increased effect, greater accuracy, decreased procedural pain, increased response rate with ultrasound guided versus blind injection.  Verbal informed consent obtained.  Time-out conducted.  Noted no overlying erythema, induration, or other signs of local infection.  Skin prepped in a sterile fashion.  Local anesthesia: Topical Ethyl chloride.  With sterile technique and under real time ultrasound guidance:  Joint visualized.  21g 2 inch needle inserted posterior approach.  Pictures taken for needle placement. Patient did have injection of 2 cc of 0.5% Marcaine , and 1cc of Kenalog  40 mg/dL. Completed without difficulty  Pain immediately resolved suggesting accurate placement of the medication.  Advised to call if fevers/chills, erythema, induration, drainage, or persistent bleeding.  Images permanently stored Impression: Technically successful ultrasound guided injection.    Procedure: Real-time Ultrasound Guided Injection of right greater trochanteric bursitis secondary to patient's body habitus Device: GE Logiq Q7 Ultrasound guided injection is preferred based studies that show increased duration, increased effect, greater accuracy, decreased procedural pain, increased response rate, and decreased cost with ultrasound guided versus blind injection.  Verbal informed consent obtained.  Time-out conducted.  Noted no overlying erythema, induration, or other signs of local infection.  Skin prepped in a sterile fashion.  Local anesthesia: Topical Ethyl chloride.  With sterile technique and under real  time ultrasound guidance:  Greater trochanteric area was visualized and patient's bursa was noted. A 22-gauge 3 inch needle was inserted and 4 cc of 0.5% Marcaine  and 1 cc of Kenalog  40 mg/dL was injected. Pictures taken Completed without difficulty  Pain immediately resolved suggesting accurate placement of the medication.  Advised to call if fevers/chills, erythema, induration, drainage, or persistent bleeding.  Images permanently stored Impression: Technically successful ultrasound guided injection.   Impression and Recommendations:    The above documentation has been reviewed and is accurate and complete Rahaf Carbonell M Saara Kijowski, DO

## 2023-12-24 ENCOUNTER — Ambulatory Visit: Admitting: Family Medicine

## 2023-12-24 ENCOUNTER — Other Ambulatory Visit: Payer: Self-pay

## 2023-12-24 ENCOUNTER — Encounter: Payer: Self-pay | Admitting: Family Medicine

## 2023-12-24 VITALS — BP 122/72 | Ht 63.0 in | Wt 184.0 lb

## 2023-12-24 DIAGNOSIS — M12812 Other specific arthropathies, not elsewhere classified, left shoulder: Secondary | ICD-10-CM | POA: Diagnosis not present

## 2023-12-24 DIAGNOSIS — M25512 Pain in left shoulder: Secondary | ICD-10-CM | POA: Diagnosis not present

## 2023-12-24 DIAGNOSIS — M7061 Trochanteric bursitis, right hip: Secondary | ICD-10-CM | POA: Diagnosis not present

## 2023-12-24 NOTE — Patient Instructions (Signed)
 Injected shoulder and hip today See me again in 3 months

## 2023-12-24 NOTE — Assessment & Plan Note (Signed)
 Has had significant problems with this before and has had arthritic changes in status post replacement.  Patient has been doing very well but does start to walk and has been trying to lose weight.  Increase activity slowly otherwise.  Continue to work on core strengthening and icing after activity follow-up again in 12 weeks

## 2023-12-24 NOTE — Assessment & Plan Note (Signed)
 Patient getting repeat injection again today.  Chronic problem with worsening symptoms.  Has responded well to injections previously and hopeful again.  Has been nearly 8 months since we have done an injection previously.  Patient of course would like to avoid any other type of replacements if necessary.  Has done remarkably well with her weight loss.  Discussed icing regimen and home exercises.  We discussed vitamin supplementation.  Follow-up in 3 months

## 2024-01-08 ENCOUNTER — Emergency Department (HOSPITAL_BASED_OUTPATIENT_CLINIC_OR_DEPARTMENT_OTHER)

## 2024-01-08 ENCOUNTER — Encounter (HOSPITAL_BASED_OUTPATIENT_CLINIC_OR_DEPARTMENT_OTHER): Payer: Self-pay | Admitting: Emergency Medicine

## 2024-01-08 ENCOUNTER — Other Ambulatory Visit: Payer: Self-pay

## 2024-01-08 ENCOUNTER — Emergency Department (HOSPITAL_BASED_OUTPATIENT_CLINIC_OR_DEPARTMENT_OTHER)
Admission: EM | Admit: 2024-01-08 | Discharge: 2024-01-08 | Disposition: A | Discharge status: Home / Self Care | Attending: Emergency Medicine | Admitting: Emergency Medicine

## 2024-01-08 DIAGNOSIS — Z79899 Other long term (current) drug therapy: Secondary | ICD-10-CM | POA: Diagnosis not present

## 2024-01-08 DIAGNOSIS — E119 Type 2 diabetes mellitus without complications: Secondary | ICD-10-CM | POA: Diagnosis not present

## 2024-01-08 DIAGNOSIS — Z794 Long term (current) use of insulin: Secondary | ICD-10-CM | POA: Diagnosis not present

## 2024-01-08 DIAGNOSIS — R059 Cough, unspecified: Secondary | ICD-10-CM | POA: Diagnosis present

## 2024-01-08 DIAGNOSIS — I10 Essential (primary) hypertension: Secondary | ICD-10-CM | POA: Diagnosis not present

## 2024-01-08 DIAGNOSIS — J3489 Other specified disorders of nose and nasal sinuses: Secondary | ICD-10-CM | POA: Diagnosis not present

## 2024-01-08 DIAGNOSIS — J069 Acute upper respiratory infection, unspecified: Secondary | ICD-10-CM | POA: Diagnosis not present

## 2024-01-08 LAB — RESP PANEL BY RT-PCR (RSV, FLU A&B, COVID)  RVPGX2
Influenza A by PCR: NEGATIVE
Influenza B by PCR: NEGATIVE
Resp Syncytial Virus by PCR: NEGATIVE
SARS Coronavirus 2 by RT PCR: NEGATIVE

## 2024-01-08 MED ORDER — PREDNISONE 10 MG PO TABS: ORAL_TABLET | ORAL | 0 refills | Status: AC

## 2024-01-08 NOTE — ED Notes (Signed)
D/c paperwork reviewed with pt, including prescriptions and follow up care.  All questions and/or concerns addressed at time of d/c.  No further needs expressed. . Pt verbalized understanding, Wheeled by family to ED exit, NAD.

## 2024-01-08 NOTE — ED Provider Notes (Signed)
 [facility] Provider Note   CSN: [id number] Arrival date & time: 01/08/24  1057     History  Chief Complaint  Patient presents with   URI    Olivia Barnes is a 68 y.o. female with history of type 2 diabetes, hypertension, hyperlipidemia, presents with concern for dry cough for the past week and a half.  This is also accompanied with some bodyaches, subjective chills but no fever.  Denies any abdominal pain, nausea or vomiting, sore throat.  Denies any shortness of breath or chest pain.  Was seen by urgent care and prescribed doxycycline .  States she has taken about 4 days of this medication without improvement in symptoms.   URI Presenting symptoms: cough   Presenting symptoms: no fever        Home Medications Prior to Admission medications   Medication Sig Start Date End Date Taking? Authorizing Provider  predniSONE  (DELTASONE ) 10 MG tablet Take 4 tablets (40 mg total) by mouth daily with breakfast for 2 days, THEN 3 tablets (30 mg total) daily with breakfast for 2 days, THEN 2 tablets (20 mg total) daily with breakfast for 2 days, THEN 1 tablet (10 mg total) daily with breakfast for 1 day. 01/08/24 01/15/24 Yes Rexie Catena, PA-C  Adalimumab (HUMIRA) 40 MG/0.4ML PSKT Inject 40 mg into the skin every 14 (fourteen) days.     [provider]  azaTHIOprine  (IMURAN ) 50 MG tablet Take 100 mg by mouth daily. 07/11/22   [provider]  bimatoprost (LUMIGAN) 0.03 % ophthalmic solution Place 1 drop into both eyes at bedtime.     [provider]  Blood Glucose Monitoring Suppl DEVI 1 each by Does not apply route in the morning, at noon, and at bedtime. May substitute to any manufacturer covered by patient's insurance. 10/29/22   Adhikari, Amrit, MD  clobetasol ointment (TEMOVATE) 0.05 % Apply 1 Application topically 2 (two) times daily as needed (alopecia). 02/06/22   [provider]  COMBIGAN  0.2-0.5 %  ophthalmic solution Place 1 drop into both eyes 2 (two) times daily.  03/26/20   [provider]  Glucose Blood (BLOOD GLUCOSE TEST STRIPS) STRP 1 each by In Vitro route in the morning, at noon, and at bedtime. May substitute to any manufacturer covered by patient's insurance. 10/29/22   Leona Rake, MD  hydrochlorothiazide  (HYDRODIURIL ) 25 MG tablet Take 12.5 mg by mouth every other day. 12/01/16   [provider]  insulin  aspart (NOVOLOG  FLEXPEN) 100 UNIT/ML FlexPen Inject 8 Units into the skin 3 (three) times daily with meals. 10/29/22   Leona Rake, MD  insulin  glargine (LANTUS ) 100 UNIT/ML Solostar Pen Inject 28 Units into the skin daily. 10/29/22   Leona Rake, MD  Insulin  Pen Needle 30G X 5 MM MISC 1 each by Does not apply route 3 (three) times daily. 10/29/22   Leona Rake, MD  losartan  (COZAAR ) 50 MG tablet Take 1 tablet (50 mg total) by mouth daily. 10/30/22   Adhikari, Amrit, MD  Multiple Vitamin (MULTIVITAMIN PO) Take 1 tablet by mouth daily.     [provider]  ondansetron  (ZOFRAN ) 4 MG tablet Take 1 tablet (4 mg total) by mouth every 8 (eight) hours as needed for nausea or vomiting. 10/04/23   Roemhildt, Lorin T, PA-C  PENNSAID  2 % SOLN APPLY 2 PUMPS TOPICALLY TO THE AFFECTED AREA(S) TWICE DAILY AS DIRECTED 02/12/21   Smith, Zachary M, DO  rosuvastatin  (CRESTOR ) 20 MG tablet Take 20 mg  by mouth daily. 03/06/20   [provider]  tacrolimus  (PROGRAF ) 1 MG capsule Take 2 mg by mouth 2 (two) times daily.     [provider]  tacrolimus  (PROTOPIC ) 0.1 % ointment Apply 1 Application topically 2 (two) times a week. 02/06/22   [provider]  Travoprost, BAK Free, (TRAVATAN) 0.004 % SOLN ophthalmic solution Place 1 drop into both eyes at bedtime. 07/11/22   [provider]      Allergies    Gabapentin , Meloxicam , and Penicillins    Review of Systems   Review of Systems  Constitutional:  Positive for chills. Negative for fever.   Respiratory:  Positive for cough.     Physical Exam Updated Vital Signs BP (!) 157/63   Pulse 66   Temp 98.1 F (36.7 C) (Oral)   Resp 15   Ht 5\' 3"  (1.6 m)   Wt 83.5 kg   SpO2 100%   BMI 32.59 kg/m  Physical Exam Vitals and nursing note reviewed.  Constitutional:      General: She is not in acute distress.    Appearance: She is well-developed. She is not ill-appearing or diaphoretic.  HENT:     Head: Normocephalic and atraumatic.     Mouth/Throat:     Mouth: Mucous membranes are moist.     Pharynx: No oropharyngeal exudate or posterior oropharyngeal erythema.  Eyes:     Conjunctiva/sclera: Conjunctivae normal.  Cardiovascular:     Rate and Rhythm: Normal rate and regular rhythm.     Heart sounds: No murmur heard. Pulmonary:     Effort: Pulmonary effort is normal. No respiratory distress.     Breath sounds: Wheezing and rhonchi present.  Abdominal:     Palpations: Abdomen is soft.     Tenderness: There is no abdominal tenderness.  Musculoskeletal:        General: No swelling.     Cervical back: Neck supple.  Skin:    General: Skin is warm and dry.     Capillary Refill: Capillary refill takes less than 2 seconds.  Neurological:     Mental Status: She is alert.  Psychiatric:        Mood and Affect: Mood normal.     ED Results / Procedures / Treatments   Labs (all labs ordered are listed, but only abnormal results are displayed) Labs Reviewed  RESP PANEL BY RT-PCR (RSV, FLU A&B, COVID)  [id number]    EKG None  Radiology DG Chest 2 View Result Date: 01/08/2024 CLINICAL DATA:  Cough for 1/2 weeks.  Shortness of breath EXAM: CHEST - 2 VIEW COMPARISON:  X-ray 10/04/2023 FINDINGS: Hyperinflation. No consolidation, pneumothorax or effusion. No edema. Normal cardiopericardial silhouette. Degenerative changes of the spine. Eventration of the right hemidiaphragm. Degenerative changes of the shoulders. Osteopenia. IMPRESSION: Hyperinflation.  No acute cardiopulmonary  disease. Electronically Signed   By: Adrianna Horde M.D.   On: 01/08/2024 12:40    Procedures Procedures    Medications Ordered in ED Medications - No data to display  ED Course/ Medical Decision Making/ A&P                                 Medical Decision Making Amount and/or Complexity of Data Reviewed Radiology: ordered.  Risk Prescription drug management.     Differential diagnosis includes but is not limited to COVID, flu, RSV, viral URI, strep pharyngitis, viral pharyngitis, allergic rhinitis, pneumonia, bronchitis  ED Course:  Upon initial evaluation, patient is well-appearing, stable vital signs aside from elevated blood pressure 157/63.  Breathing comfortably on room air, talking in full sentences.  Afebrile, not tachycardic.  Lungs with some rhonchi and wheezing in the bases bilaterally.  Dry cough noted on exam.  Patient's chest x-ray without any consolidations concerning for pneumonia, no other acute abnormalities, and given no improvement on doxycycline  prescribed by urgent care, low concern for pneumonia at this time.  Do not feel we need additional CT imaging.  COVID, flu, RSV testing negative.  Suspect patient has other viral URI. Although patient is known diabetic, most recent hemoglobin A1c at 5.8, well-controlled.  Feel we can try a course of prednisone  for treatment of symptoms.  She was also prescribed benzonatate from urgent care, discussed she can continue this for symptomatic treatment. She did have mild wheezing and rhonchi on exam, but patient declines breathing treatment at this time.  Stable and appropriate for discharge home.  Labs Ordered: I Ordered, and personally interpreted labs.  The pertinent results include:   COVID, flu, RSV negative  Imaging Studies ordered: I ordered imaging studies including chest x-ray I independently visualized the imaging with scope of interpretation limited to determining acute life threatening conditions related to  emergency care. Imaging showed no acute abnormalities I agree with the radiologist interpretation  Impression: Viral URI  Disposition:  The patient was discharged home with instructions to take course of prednisone  as prescribed.  Over-the-counter medications as needed for symptom control.  Discontinue doxycyline that was prescribed by UC as she does not have any signs of pneumonia. Follow-up with PCP for recheck if symptoms not improved within the next week. Return precautions given.    Record Review: External records from outside source obtained and reviewed including hemoglobin A1c taken 3 weeks ago, 5.8     This chart was dictated using voice recognition software, Dragon. Despite the best efforts of this provider to proofread and correct errors, errors may still occur which can change documentation meaning.  dm         Final Clinical Impression(s) / ED Diagnoses Final diagnoses:  Viral URI with cough    Rx / DC Orders ED Discharge Orders          Ordered    predniSONE  (DELTASONE ) 10 MG tablet  Q breakfast        01/08/24 1344              Rexie Catena, PA-C 01/08/24 1411    Wynetta Heckle, MD 01/09/24 [phone]

## 2024-01-08 NOTE — Discharge Instructions (Signed)
 You appear to have an upper respiratory infection (URI). An upper respiratory tract infection, or cold, is a viral infection of the air passages leading to the lungs. It should improve gradually after 5-7 days. You may have a lingering cough that lasts for 2- 4 weeks after the infection.  Your flu, covid, and RSV test were negative today. Your chest x-ray did not show any signs of pneumonia  There are no medications, such as antibiotics, that will cure your infection.   You have been prescribed prednisone to help with your lung inflammation. Please take this medication as prescribed for the next 7 days (40mg  on days 1 and 2, 30mg  on days 3 and 4, 20mg  on days 5 and 6, 10mg  on day 7). Take this medication in the morning with breakfast, as taking it at night may make it hard to sleep. If you are a diabetic, please monitor your blood sugars closely on this medication, as it can cause your blood sugar to rise. If your blood sugars rise above 400, stop taking the medication and go to the ER.   You may continue taking the benzonatate as prescribed by urgent care for your cough.  Stop taking your doxycyline (the antibiotic) as there are no signs of pneumonia.    Home care instructions:  You can take Tylenol  and/or Ibuprofen as directed on the packaging for fever reduction and pain relief.    For cough: honey 1/2 to 1 teaspoon (you can dilute the honey in water  or another fluid).  You can also use guaifenesin and dextromethorphan for cough which are over-the-counter medications. You can use a humidifier for chest congestion and cough.  If you don't have a humidifier, you can sit in the bathroom with the hot shower running.      For sore throat: try warm salt water  gargles, cepacol lozenges, throat spray, warm tea or water  with lemon/honey, popsicles or ice, or OTC cold relief medicine for throat discomfort.    For congestion: Flonase (Fluticasone) 1-2 sprays in each nostril daily. This is an over the  counter medication.    It is important to stay hydrated: drink plenty of fluids (water , gatorade/powerade/pedialyte, juices, or teas) to keep your throat moisturized and help further relieve irritation/discomfort.   Your illness is contagious and can be spread to others, especially during the first 3 or 4 days. It cannot be cured by antibiotics or other medicines. Take basic precautions such as washing your hands often, covering your mouth when you cough or sneeze, and avoiding public places where you could spread your illness to others.   Follow-up instructions: Please follow-up with your primary care provider for further evaluation of your symptoms if you are not feeling better within the next 5 days.   Return instructions:  Please return to the Emergency Department if you experience worsening symptoms.  RETURN IMMEDIATELY IF you develop shortness of breath, confusion or altered mental status, a new rash, become dizzy, faint, or poorly responsive, or are unable to be cared for at home. Please return if you have persistent vomiting and cannot keep down fluids or develop a fever that is not controlled by tylenol  or motrin.   Please return if you have any other emergent concerns.

## 2024-01-08 NOTE — ED Triage Notes (Signed)
 Pt POV steady gait- c/o chills, headache, non productive cough x1.5 weeks.   Tylenol  last taken 0730 today.

## 2024-02-04 ENCOUNTER — Ambulatory Visit: Admitting: Sports Medicine

## 2024-02-04 VITALS — BP 138/80 | HR 68 | Ht 63.0 in

## 2024-02-04 DIAGNOSIS — G8929 Other chronic pain: Secondary | ICD-10-CM | POA: Diagnosis not present

## 2024-02-04 DIAGNOSIS — M51362 Other intervertebral disc degeneration, lumbar region with discogenic back pain and lower extremity pain: Secondary | ICD-10-CM | POA: Diagnosis not present

## 2024-02-04 NOTE — Progress Notes (Signed)
 Ben Kathey Simer D.[facility] [address] [place] [address] Phone: [phone]   Assessment and Plan:     1. Chronic bilateral low back pain with bilateral sciatica 2. Degeneration of intervertebral disc of lumbar region with discogenic back pain and lower extremity pain -Chronic with exacerbation, subsequent visit - Consistent with recurrent flare of low back pain with bilateral radicular symptoms, worse on right.  Most consistent with degenerative changes and stenosis at L5-S1 worse on right leading to right lumbar radiculopathy - Patient has had significant improvement in the past with epidural CSI and elected to proceed with repeat epidural CSI to right sided L5-S1.  Order placed today - May use Tylenol /NSAIDs as needed for pain relief - Continue activity as tolerated - Recommend out of work until next Monday, 02/08/2024 to prevent worsening pain  Pertinent previous records reviewed include lumbar MRI 02/16/2021  Follow Up: 2 weeks after epidural to review benefit   Subjective:   I, Adrienne Horning, am serving as a scribe for Dr. Ulysees Gander  Chief Complaint: radiating back pain   HPI:   02/04/24 Patient is a 68 year old female presenting to [facility] sports medicine with lower back pain that radiates down her legs. Patient is a smith patient and was last seen on 12/24/23 for left shoulder pain and right hip bursitis. Today patient states the pain in lower back started a week ago no MOI. Locates pain to mid lower back that radiates around to the right lower back down the back of her leg stopping right before the knee. Pain is constant even with tylenol  and topical cream and heat. Pain does interrupts sleep. Sometimes when sitting it will help the pain. 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  Relevant Historical Information: Hypertension  Additional pertinent review of systems negative.   Current Outpatient  Medications:    Adalimumab (HUMIRA) 40 MG/0.4ML PSKT, Inject 40 mg into the skin every 14 (fourteen) days. , Disp: , Rfl:    azaTHIOprine  (IMURAN ) 50 MG tablet, Take 100 mg by mouth daily., Disp: , Rfl:    bimatoprost (LUMIGAN) 0.03 % ophthalmic solution, Place 1 drop into both eyes at bedtime. , Disp: , Rfl:    Blood Glucose Monitoring Suppl DEVI, 1 each by Does not apply route in the morning, at noon, and at bedtime. May substitute to any manufacturer covered by patient's insurance., Disp: 1 each, Rfl: 0   clobetasol ointment (TEMOVATE) 0.05 %, Apply 1 Application topically 2 (two) times daily as needed (alopecia)., Disp: , Rfl:    COMBIGAN  0.2-0.5 % ophthalmic solution, Place 1 drop into both eyes 2 (two) times daily. , Disp: , Rfl:    Glucose Blood (BLOOD GLUCOSE TEST STRIPS) STRP, 1 each by In Vitro route in the morning, at noon, and at bedtime. May substitute to any manufacturer covered by patient's insurance., Disp: 100 strip, Rfl: 0   hydrochlorothiazide  (HYDRODIURIL ) 25 MG tablet, Take 12.5 mg by mouth every other day., Disp: , Rfl:    insulin  aspart (NOVOLOG  FLEXPEN) 100 UNIT/ML FlexPen, Inject 8 Units into the skin 3 (three) times daily with meals., Disp: 15 mL, Rfl: 1   insulin  glargine (LANTUS ) 100 UNIT/ML Solostar Pen, Inject 28 Units into the skin daily., Disp: 15 mL, Rfl: 1   Insulin  Pen Needle 30G X 5 MM MISC, 1 each by Does not apply route 3 (three) times daily., Disp: 100 each, Rfl: 1  losartan  (COZAAR ) 50 MG tablet, Take 1 tablet (50 mg total) by mouth daily., Disp: 30 tablet, Rfl: 1   Multiple Vitamin (MULTIVITAMIN PO), Take 1 tablet by mouth daily. , Disp: , Rfl:    ondansetron  (ZOFRAN ) 4 MG tablet, Take 1 tablet (4 mg total) by mouth every 8 (eight) hours as needed for nausea or vomiting., Disp: 10 tablet, Rfl: 0   PENNSAID  2 % SOLN, APPLY 2 PUMPS TOPICALLY TO THE AFFECTED AREA(S) TWICE DAILY AS DIRECTED, Disp: 112 g, Rfl: 3   rosuvastatin  (CRESTOR ) 20 MG tablet, Take 20 mg by  mouth daily., Disp: , Rfl:    tacrolimus  (PROGRAF ) 1 MG capsule, Take 2 mg by mouth 2 (two) times daily. , Disp: , Rfl:    tacrolimus  (PROTOPIC ) 0.1 % ointment, Apply 1 Application topically 2 (two) times a week., Disp: , Rfl:    Travoprost, BAK Free, (TRAVATAN) 0.004 % SOLN ophthalmic solution, Place 1 drop into both eyes at bedtime., Disp: , Rfl:    Objective:     Vitals:   02/04/24 1125  BP: 138/80  Pulse: 68  SpO2: 99%  Height: 5' 3 (1.6 m)      Body mass index is 32.59 kg/m.    Physical Exam:    Gen: Appears well, nad, nontoxic and pleasant Psych: Alert and oriented, appropriate mood and affect Neuro: sensation intact, strength is 5/5 in upper and lower extremities, muscle tone wnl Skin: no susupicious lesions or rashes  Back - Normal skin, Spine with normal alignment and no deformity.     tenderness to lumbar vertebral process palpation.   Bilateral lumbar paraspinous muscles are   tender and without spasm NTTP gluteal musculature Straight leg raise positive right  Gait antalgic, short and slow steps   Electronically signed by:  Marshall Skeeter D.[facility] 11:57 AM 02/04/24

## 2024-02-04 NOTE — Patient Instructions (Addendum)
 Good to see you  Epidural CSI right sided L5-S1 ordered to [facility] call [phone] to schedule  Work note out of work till 02/08/24 Follow up 2 weeks after epidural

## 2024-02-09 NOTE — Discharge Instructions (Signed)

## 2024-02-10 ENCOUNTER — Inpatient Hospital Stay
Admission: RE | Admit: 2024-02-10 | Discharge: 2024-02-10 | Disposition: A | Discharge status: Home / Self Care | Source: Ambulatory Visit | Attending: Sports Medicine

## 2024-02-15 NOTE — Discharge Instructions (Signed)

## 2024-02-16 ENCOUNTER — Ambulatory Visit
Admission: RE | Admit: 2024-02-16 | Discharge: 2024-02-16 | Disposition: A | Discharge status: Home / Self Care | Source: Ambulatory Visit | Attending: Sports Medicine | Admitting: Sports Medicine

## 2024-02-16 DIAGNOSIS — M51362 Other intervertebral disc degeneration, lumbar region with discogenic back pain and lower extremity pain: Secondary | ICD-10-CM

## 2024-02-16 DIAGNOSIS — G8929 Other chronic pain: Secondary | ICD-10-CM

## 2024-02-16 MED ORDER — IOPAMIDOL (ISOVUE-M 200) INJECTION 41%
1.0000 mL | Freq: Once | INTRAMUSCULAR | Status: AC
  Administered 2024-02-16: 1 mL via EPIDURAL

## 2024-02-16 MED ORDER — METHYLPREDNISOLONE ACETATE 40 MG/ML INJ SUSP (RADIOLOG
80.0000 mg | Freq: Once | INTRAMUSCULAR | Status: AC
  Administered 2024-02-16: 80 mg via EPIDURAL

## 2024-03-22 NOTE — Progress Notes (Unsigned)
 Darlyn Claudene [facility] [address] [place] [address] Phone: [phone] Subjective:   ISusannah Gully, am serving as a scribe for Dr. Arthea Claudene.  I'm seeing this patient by the request  of:  [name]., MD  CC: Right hip and left shoulder pain follow-up.  Also low back pain  YEP:Dlagzrupcz  12/24/2023 Patient getting repeat injection again today.  Chronic problem with worsening symptoms.  Has responded well to injections previously and hopeful again.  Has been nearly 8 months since we have done an injection previously.  Patient of course would like to avoid any other type of replacements if necessary.  Has done remarkably well with her weight loss.  Discussed icing regimen and home exercises.  We discussed vitamin supplementation.  Follow-up in 3 months     Has had significant problems with this before and has had arthritic changes in status post replacement.  Patient has been doing very well but does start to walk and has been trying to lose weight.  Increase activity slowly otherwise.  Continue to work on core strengthening and icing after activity follow-up again in 12 weeks      Update 03/24/2024 Jerris Luff is a 68 y.o. female coming in with complaint of R hip and L shoulder pain. Saw Dr. Leonce on 02/04/2024 for lumbar spine pain. Patient states having some R hip pain. Shoulder is doing well. L knee is bothersome today.  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   Patient was having low back pain in June 24 was given an epidural in the back at the L5-S1.  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    TOENAIL EXCISION Left 12/2020   2nd digit    TOTAL KNEE ARTHROPLASTY Left 07/27/2020   Procedure:  LEFT TOTAL KNEE ARTHROPLASTY;  Surgeon: Vernetta Lonni GRADE, MD;  Location: [facility];  Service: Orthopedics;  Laterality: Left;   Social History   Socioeconomic History   Marital status: Married    Spouse name: Not on file   Number of children: Not on file   Years of education: Not on file   Highest education level: Not on file  Occupational History   Not on file  Tobacco Use   Smoking status: Never   Smokeless tobacco: Never  Vaping Use   Vaping status: Never Used  Substance and Sexual Activity   Alcohol use: Never    Alcohol/week: 0.0 standard drinks of alcohol   Drug use: Never   Sexual activity: Not on file  Other Topics Concern   Not on file  Social History Narrative   Right handed   Two story home   Drinks caffeine   Social Drivers of Health   Financial Resource Strain: Not on file  Food Insecurity: Low Risk  (12/15/2023)   Received from [facility]   Hunger Vital Sign    Within the past 12 months, you worried that your food would run out before you got money to buy more: Never true    Within the past 12 months, the food you bought just didn't last and you didn't have money to get more. : Never true  Transportation  Needs: No Transportation Needs (12/15/2023)   Received from [facility]   Transportation    In the past 12 months, has lack of reliable transportation kept you from medical appointments, meetings, work or from getting things needed for daily living? : No  Physical Activity: Not on file  Stress: Not on file  Social Connections: Not on file   Allergies  Allergen Reactions   Gabapentin  Nausea And Vomiting   Meloxicam     Penicillins Rash   Family History  Problem Relation Age of Onset   Heart disease Mother    Healthy Brother     Current Outpatient Medications (Endocrine & Metabolic):    insulin  aspart (NOVOLOG  FLEXPEN) 100 UNIT/ML FlexPen, Inject 8 Units into the skin 3 (three) times daily with meals.   insulin  glargine (LANTUS ) 100 UNIT/ML  Solostar Pen, Inject 28 Units into the skin daily.  Current Outpatient Medications (Cardiovascular):    hydrochlorothiazide  (HYDRODIURIL ) 25 MG tablet, Take 12.5 mg by mouth every other day.   losartan  (COZAAR ) 50 MG tablet, Take 1 tablet (50 mg total) by mouth daily.   rosuvastatin  (CRESTOR ) 20 MG tablet, Take 20 mg by mouth daily.   Current Outpatient Medications (Analgesics):    Adalimumab (HUMIRA) 40 MG/0.4ML PSKT, Inject 40 mg into the skin every 14 (fourteen) days.    Current Outpatient Medications (Other):    azaTHIOprine  (IMURAN ) 50 MG tablet, Take 100 mg by mouth daily.   bimatoprost (LUMIGAN) 0.03 % ophthalmic solution, Place 1 drop into both eyes at bedtime.    Blood Glucose Monitoring Suppl DEVI, 1 each by Does not apply route in the morning, at noon, and at bedtime. May substitute to any manufacturer covered by patient's insurance.   clobetasol ointment (TEMOVATE) 0.05 %, Apply 1 Application topically 2 (two) times daily as needed (alopecia).   COMBIGAN  0.2-0.5 % ophthalmic solution, Place 1 drop into both eyes 2 (two) times daily.    Glucose Blood (BLOOD GLUCOSE TEST STRIPS) STRP, 1 each by In Vitro route in the morning, at noon, and at bedtime. May substitute to any manufacturer covered by patient's insurance.   Insulin  Pen Needle 30G X 5 MM MISC, 1 each by Does not apply route 3 (three) times daily.   Multiple Vitamin (MULTIVITAMIN PO), Take 1 tablet by mouth daily.    ondansetron  (ZOFRAN ) 4 MG tablet, Take 1 tablet (4 mg total) by mouth every 8 (eight) hours as needed for nausea or vomiting.   PENNSAID  2 % SOLN, APPLY 2 PUMPS TOPICALLY TO THE AFFECTED AREA(S) TWICE DAILY AS DIRECTED   tacrolimus  (PROGRAF ) 1 MG capsule, Take 2 mg by mouth 2 (two) times daily.    tacrolimus  (PROTOPIC ) 0.1 % ointment, Apply 1 Application topically 2 (two) times a week.   Travoprost, BAK Free, (TRAVATAN) 0.004 % SOLN ophthalmic solution, Place 1 drop into both eyes at bedtime.   Reviewed  prior external information including notes and imaging from  primary care provider As well as notes that were available from care everywhere and other healthcare systems.  Past medical history, social, surgical and family history all reviewed in electronic medical record.  No pertanent information unless stated regarding to the chief complaint.   Review of Systems:  No headache, visual changes, nausea, vomiting, diarrhea, constipation, dizziness, abdominal pain, skin rash, fevers, chills, night sweats, weight loss, swollen lymph nodes, body aches, joint swelling, chest pain, shortness of breath, mood changes. POSITIVE muscle aches  Objective  There were no vitals taken for this visit.  General: No apparent distress alert and oriented x3 mood and affect normal, dressed appropriately.  HEENT: Pupils equal, extraocular movements intact  Respiratory: Patient's speak in full sentences and does not appear short of breath  Cardiovascular: No lower extremity edema, non tender, no erythema  Left knee does have replacement noted.  Patient does have tenderness more on the medial superior tibia. Patient's right hip severely tender over the greater trochanteric area as well as of the gluteal tendon itself laterally.   Procedure: Real-time Ultrasound Guided Injection of right greater trochanteric bursitis secondary to patient's body habitus Device: GE Logiq Q7 Ultrasound guided injection is preferred based studies that show increased duration, increased effect, greater accuracy, decreased procedural pain, increased response rate, and decreased cost with ultrasound guided versus blind injection.  Verbal informed consent obtained.  Time-out conducted.  Noted no overlying erythema, induration, or other signs of local infection.  Skin prepped in a sterile fashion.  Local anesthesia: Topical Ethyl chloride.  With sterile technique and under real time ultrasound guidance:  Greater trochanteric area was  visualized and patient's bursa was noted. A 22-gauge 3 inch needle was inserted and 4 cc of 0.5% Marcaine  and 1 cc of Kenalog  40 mg/dL was injected. Pictures taken Completed without difficulty  Pain immediately resolved suggesting accurate placement of the medication.  Advised to call if fevers/chills, erythema, induration, drainage, or persistent bleeding.  Images permanently stored  Impression: Technically successful ultrasound guided injection.    Impression and Recommendations:    The above documentation has been reviewed and is accurate and complete Sherian Valenza M Birdella Sippel, DO

## 2024-03-24 ENCOUNTER — Other Ambulatory Visit: Payer: Self-pay

## 2024-03-24 ENCOUNTER — Ambulatory Visit: Admitting: Family Medicine

## 2024-03-24 ENCOUNTER — Encounter: Payer: Self-pay | Admitting: Family Medicine

## 2024-03-24 VITALS — BP 110/68 | HR 63 | Ht 63.0 in | Wt 181.0 lb

## 2024-03-24 DIAGNOSIS — M1611 Unilateral primary osteoarthritis, right hip: Secondary | ICD-10-CM | POA: Diagnosis not present

## 2024-03-24 DIAGNOSIS — G8929 Other chronic pain: Secondary | ICD-10-CM

## 2024-03-24 DIAGNOSIS — M25512 Pain in left shoulder: Secondary | ICD-10-CM | POA: Diagnosis not present

## 2024-03-24 DIAGNOSIS — M7050 Other bursitis of knee, unspecified knee: Secondary | ICD-10-CM | POA: Insufficient documentation

## 2024-03-24 DIAGNOSIS — M7061 Trochanteric bursitis, right hip: Secondary | ICD-10-CM

## 2024-03-24 DIAGNOSIS — M7052 Other bursitis of knee, left knee: Secondary | ICD-10-CM | POA: Diagnosis not present

## 2024-03-24 NOTE — Patient Instructions (Addendum)
 Good to see you! Heel lifts Injection in hip See you again in 2-3 months

## 2024-03-24 NOTE — Assessment & Plan Note (Signed)
 Discussed hamstring pathology, shortened stride length, worsening pain can consider injection but would like to avoid it if possible.

## 2024-03-24 NOTE — Assessment & Plan Note (Signed)
 Chronic problem with exacerbation.  Has done relatively well with the weight loss as well as with the strengthening.  Patient is continuing to try to be active.  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  Differential includes lumbar radiculopathy.  No change in medications.  Follow-up again in 6 to 8 weeks.

## 2024-05-14 ENCOUNTER — Ambulatory Visit
Admission: EM | Admit: 2024-05-14 | Discharge: 2024-05-14 | Disposition: A | Discharge status: Home / Self Care | Attending: Physician Assistant | Admitting: Physician Assistant

## 2024-05-14 ENCOUNTER — Other Ambulatory Visit: Payer: Self-pay

## 2024-05-14 ENCOUNTER — Ambulatory Visit: Payer: Self-pay

## 2024-05-14 DIAGNOSIS — M109 Gout, unspecified: Secondary | ICD-10-CM

## 2024-05-14 MED ORDER — COLCHICINE 0.6 MG PO TABS: 0.6000 mg | ORAL_TABLET | Freq: Every day | ORAL | 0 refills | Status: AC

## 2024-05-14 NOTE — ED Triage Notes (Addendum)
 Pt presents with a chief complaint of right hand pain that started yesterday. Describes as aching. States she cannot make a fist or move her pointer, middle, and/or ring finger. No known injuries. Currently rates overall pain an 8/10. OTC Tylenol  taken with no improvement/pain relief.

## 2024-05-14 NOTE — Discharge Instructions (Addendum)
 VISIT SUMMARY:  You came in today because of severe pain in your right hand, especially in the index, middle, and ring fingers. The pain started yesterday and is quite intense, making it difficult for you to move your fingers or perform daily tasks. You have diabetes, and your blood sugar levels are generally well-controlled.  YOUR PLAN:  -RIGHT HAND GOUT FLARE: A gout flare is a sudden and severe pain in the joints caused by the buildup of uric acid crystals. In your case, it is affecting your right hand. We will check your uric acid levels and have prescribed colchicine  to take once daily until the pain subsides. You can also take Tylenol  for pain relief. Apply warm or cool compresses to the affected area and try to keep your hand moving to avoid stiffness. If you experience severe swelling or cannot move your fingers, seek emergency care immediately.   INSTRUCTIONS:  Please start taking the Colchicine  by mouth once per day and stay well hydrated. If your symptoms worsen or you experience severe swelling or inability to move your fingers, seek emergency care immediately.

## 2024-05-14 NOTE — ED Provider Notes (Signed)
 [facility]    CSN: [id number] Arrival date & time: 05/14/24  9048      History   Chief Complaint Chief Complaint  Patient presents with   Hand Pain    HPI Olivia Barnes is a 68 y.o. female.  has a past medical history of Arthritis, Hyperlipemia, and Hypertension.   HPI  Discussed the use of AI scribe software for clinical note transcription with the patient, who gave verbal consent to proceed.  The patient, with diabetes, presents with right hand pain.  She experiences severe pain in her right hand, primarily affecting the index, middle, and ring fingers, which began yesterday. The pain is rated as 8 out of 10 and worsens with touch or when attempting to open objects like a refrigerator. She is unable to make a fist or move these fingers effectively.  No recent injuries or trauma to the hand. She has not taken any medication for the pain, as she tries to avoid it.  Her past medical history includes diabetes, and she has been advised against taking NSAIDs. She does not have a history of gout. She monitors her blood sugar levels at home, which typically range from 114 to 129, with occasional drops to 60 at night. No history of liver problems to her knowledge  Most recent A1c was 5.8% per chart review from encounter on 12/15/23  eGFR is greater than 60 per labs from 04/28/24 - no evidence of reduced kidney function on chart review   Past Medical History:  Diagnosis Date   Arthritis    Hyperlipemia    Hypertension     Patient Active Problem List   Diagnosis Date Noted   Anserine bursitis 03/24/2024   Rotator cuff arthropathy, left 06/09/2023   Hyperosmolar hyperglycemic state (HHS) (HCC) 10/27/2022   AKI (acute kidney injury) (HCC) 10/27/2022   Hyperkalemia 10/27/2022   Right rotator cuff tear 07/01/2022   Acute bursitis of left shoulder 02/11/2022   Chronic left SI joint pain 08/07/2021   Arthritis of carpometacarpal (CMC) joint of right thumb 08/07/2021    AC (acromioclavicular) arthritis 12/27/2020   Greater trochanteric bursitis of right hip 12/27/2020   Status post total left knee replacement 07/27/2020   Degenerative arthritis of right knee 07/04/2020   Neck muscle spasm 06/05/2020   Unilateral primary osteoarthritis, left knee 05/22/2020   Uveitis 01/11/2020   Acute bursitis of right shoulder 12/06/2019   Baker's cyst of knee, left 09/30/2019   Ganglion of left knee 03/11/2019   Tear of left hamstring 03/01/2019   Chronic bilateral hip pain after total replacement of both hip joints 01/24/2019   Lumbar radiculopathy 03/20/2016   Arthritis of right hip 10/20/2014   Septic arthritis (HCC) 07/13/2014   Transient synovitis of wrist 07/13/2014   Constipation 07/13/2014   Essential hypertension 07/13/2014   Primary osteoarthritis of right hip 07/13/2014    Past Surgical History:  Procedure Laterality Date   arm surgery Left    plate in left arm   CYST EXCISION Left    knee    REPLACEMENT TOTAL HIP W/  RESURFACING IMPLANTS Bilateral    TOENAIL EXCISION Left 12/2020   2nd digit    TOTAL KNEE ARTHROPLASTY Left 07/27/2020   Procedure: LEFT TOTAL KNEE ARTHROPLASTY;  Surgeon: Vernetta Lonni GRADE, MD;  Location: [facility];  Service: Orthopedics;  Laterality: Left;    OB History   No obstetric history on file.      Home Medications    Prior to Admission medications  Medication Sig Start Date End Date Taking? Authorizing Provider  colchicine  0.6 MG tablet Take 1 tablet (0.6 mg total) by mouth daily. 05/14/24  Yes Aijalon Kirtz E, PA-C  Adalimumab (HUMIRA) 40 MG/0.4ML PSKT Inject 40 mg into the skin every 14 (fourteen) days.     [provider]  azaTHIOprine  (IMURAN ) 50 MG tablet Take 100 mg by mouth daily. 07/11/22   [provider]  bimatoprost (LUMIGAN) 0.03 % ophthalmic solution Place 1 drop into both eyes at bedtime.     [provider]  Blood Glucose Monitoring Suppl DEVI 1 each by Does not apply  route in the morning, at noon, and at bedtime. May substitute to any manufacturer covered by patient's insurance. 10/29/22   Adhikari, Amrit, MD  clobetasol ointment (TEMOVATE) 0.05 % Apply 1 Application topically 2 (two) times daily as needed (alopecia). 02/06/22   [provider]  COMBIGAN  0.2-0.5 % ophthalmic solution Place 1 drop into both eyes 2 (two) times daily.  03/26/20   [provider]  Glucose Blood (BLOOD GLUCOSE TEST STRIPS) STRP 1 each by In Vitro route in the morning, at noon, and at bedtime. May substitute to any manufacturer covered by patient's insurance. 10/29/22   Jillian Buttery, MD  hydrochlorothiazide  (HYDRODIURIL ) 25 MG tablet Take 12.5 mg by mouth every other day. 12/01/16   [provider]  insulin  aspart (NOVOLOG  FLEXPEN) 100 UNIT/ML FlexPen Inject 8 Units into the skin 3 (three) times daily with meals. 10/29/22   Jillian Buttery, MD  insulin  glargine (LANTUS ) 100 UNIT/ML Solostar Pen Inject 28 Units into the skin daily. 10/29/22   Jillian Buttery, MD  Insulin  Pen Needle 30G X 5 MM MISC 1 each by Does not apply route 3 (three) times daily. 10/29/22   Jillian Buttery, MD  losartan  (COZAAR ) 50 MG tablet Take 1 tablet (50 mg total) by mouth daily. 10/30/22   Adhikari, Amrit, MD  Multiple Vitamin (MULTIVITAMIN PO) Take 1 tablet by mouth daily.     [provider]  ondansetron  (ZOFRAN ) 4 MG tablet Take 1 tablet (4 mg total) by mouth every 8 (eight) hours as needed for nausea or vomiting. 10/04/23   Roemhildt, Lorin T, PA-C  PENNSAID  2 % SOLN APPLY 2 PUMPS TOPICALLY TO THE AFFECTED AREA(S) TWICE DAILY AS DIRECTED 02/12/21   Smith, Zachary M, DO  rosuvastatin  (CRESTOR ) 20 MG tablet Take 20 mg by mouth daily. 03/06/20   [provider]  tacrolimus  (PROGRAF ) 1 MG capsule Take 2 mg by mouth 2 (two) times daily.     [provider]  tacrolimus  (PROTOPIC ) 0.1 % ointment Apply 1 Application topically 2 (two) times a week. 02/06/22   [provider]  Travoprost, BAK Free, (TRAVATAN) 0.004 % SOLN ophthalmic solution Place 1 drop into both eyes at bedtime. 07/11/22   [provider]    Family History Family History  Problem Relation Age of Onset   Heart disease Mother    Healthy Brother     Social History Social History   Tobacco Use   Smoking status: Never   Smokeless tobacco: Never  Vaping Use   Vaping status: Never Used  Substance Use Topics   Alcohol use: Never    Alcohol/week: 0.0 standard drinks of alcohol   Drug use: Never     Allergies   Gabapentin , Meloxicam , and Penicillins   Review of Systems Review of Systems   Physical Exam Triage Vital Signs ED Triage Vitals  Encounter Vitals Group  BP 05/14/24 0959 117/77     Girls Systolic BP Percentile --      Girls Diastolic BP Percentile --      Boys Systolic BP Percentile --      Boys Diastolic BP Percentile --      Pulse Rate 05/14/24 0959 61     Resp 05/14/24 0959 19     Temp 05/14/24 0959 98 F (36.7 C)     Temp Source 05/14/24 0959 Oral     SpO2 05/14/24 0959 98 %     Weight 05/14/24 0959 176 lb (79.8 kg)     Height 05/14/24 0959 5' 3 (1.6 m)     Head Circumference --      Peak Flow --      Pain Score 05/14/24 1009 8     Pain Loc --      Pain Education --      Exclude from Growth Chart --    No data found.  Updated Vital Signs BP 117/77 (BP Location: Right Arm)   Pulse 61   Temp 98 F (36.7 C) (Oral)   Resp 19   Ht 5' 3 (1.6 m)   Wt 176 lb (79.8 kg)   SpO2 98%   BMI 31.18 kg/m   Visual Acuity Right Eye Distance:   Left Eye Distance:   Bilateral Distance:    Right Eye Near:   Left Eye Near:    Bilateral Near:     Physical Exam Vitals reviewed.  Constitutional:      General: She is awake.     Appearance: Normal appearance. She is well-developed and well-groomed.  HENT:     Head: Normocephalic and atraumatic.  Eyes:     General: Lids are normal. Gaze aligned appropriately.     Extraocular Movements:  Extraocular movements intact.     Conjunctiva/sclera: Conjunctivae normal.  Pulmonary:     Effort: Pulmonary effort is normal.  Musculoskeletal:     Comments: Right hand is slightly swollen over the MCP joints of the index, middle and ring fingers compared to left. There is mild redness and increased warmth in this area compared to surrounding tissue  Neurological:     Mental Status: She is alert and oriented to person, place, and time.  Psychiatric:        Attention and Perception: Attention and perception normal.        Mood and Affect: Mood and affect normal.        Speech: Speech normal.        Behavior: Behavior normal. Behavior is cooperative.      UC Treatments / Results  Labs (all labs ordered are listed, but only abnormal results are displayed) Labs Reviewed  URIC ACID    EKG   Radiology No results found.  Procedures Procedures (including critical care time)  Medications Ordered in UC Medications - No data to display  Initial Impression / Assessment and Plan / UC Course  I have reviewed the triage vital signs and the nursing notes.  Pertinent labs & imaging results that were available during my care of the patient were reviewed by me and considered in my medical decision making (see chart for details).      Final Clinical Impressions(s) / UC Diagnoses   Final diagnoses:  Acute gout of right hand, unspecified cause   Right hand gout flare Acute gout flare in the right hand, affecting the index, middle, and ring fingers, with severe pain (8/10), swelling, and warmth in  the knuckles. No history of trauma or injury. Differential diagnosis includes arthritis or sprain, but presentation is more consistent with gout. - Order uric acid level - Prescribe colchicine  once daily until pain subsides - Recommend Tylenol  for pain management - Advise on dietary changes to prevent future gout flares - Advise warm or cool compresses to the affected area - Instruct to  avoid keeping the hand in one position to prevent stiffness - Advise to seek emergency care if severe swelling or inability to move fingers occurs   Discharge Instructions      VISIT SUMMARY:  You came in today because of severe pain in your right hand, especially in the index, middle, and ring fingers. The pain started yesterday and is quite intense, making it difficult for you to move your fingers or perform daily tasks. You have diabetes, and your blood sugar levels are generally well-controlled.  YOUR PLAN:  -RIGHT HAND GOUT FLARE: A gout flare is a sudden and severe pain in the joints caused by the buildup of uric acid crystals. In your case, it is affecting your right hand. We will check your uric acid levels and have prescribed colchicine  to take once daily until the pain subsides. You can also take Tylenol  for pain relief. Apply warm or cool compresses to the affected area and try to keep your hand moving to avoid stiffness. If you experience severe swelling or cannot move your fingers, seek emergency care immediately.   INSTRUCTIONS:  Please start taking the Colchicine  by mouth once per day and stay well hydrated. If your symptoms worsen or you experience severe swelling or inability to move your fingers, seek emergency care immediately.     ED Prescriptions     Medication Sig Dispense Auth. Provider   colchicine  0.6 MG tablet Take 1 tablet (0.6 mg total) by mouth daily. 15 tablet Vinod Mikesell E, PA-C      PDMP not reviewed this encounter.   Azarias Chiou, Rocky BRAVO, PA-C 05/14/24 1104

## 2024-05-15 LAB — URIC ACID: Uric Acid: 4.2 mg/dL (ref 3.0–7.2)

## 2024-05-16 ENCOUNTER — Ambulatory Visit (HOSPITAL_COMMUNITY): Payer: Self-pay

## 2024-05-19 NOTE — Progress Notes (Signed)
 [facility] [address] [place] [address] Phone: [phone] Subjective:   [name], am serving as a scribe for Dr. Arthea Claudene.  I'm seeing this patient by the request  of:  [name]., MD  CC: Multiple joint complaints  YEP:Dlagzrupcz  03/24/2024 Discussed hamstring pathology, shortened stride length, worsening pain can consider injection but would like to avoid it if possible.     Chronic problem with exacerbation.  Has done relatively well with the weight loss as well as with the strengthening.  Patient is continuing to try to be active.  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  Differential includes lumbar radiculopathy.  No change in medications.  Follow-up again in 6 to 8 weeks.     Update 05/24/2024 Olivia Barnes is a 68 y.o. female coming in with complaint of R hip, L shoulder and L knee pain. Patient states that her hip pain is constant. Pain is not improving much.   L shoulder is doing ok today.   L knee is still somewhat sore. Hx of TKR.     Was seen recently by another provider for what appeared to be acute gout in the right hand.  This was 10 days ago.  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    TOENAIL EXCISION Left 12/2020   2nd digit    TOTAL KNEE ARTHROPLASTY Left 07/27/2020   Procedure: LEFT TOTAL KNEE ARTHROPLASTY;  Surgeon: Vernetta Lonni GRADE, MD;  Location: [facility];  Service: Orthopedics;  Laterality: Left;   Social History   Socioeconomic History   Marital status: Married    Spouse name: Not on file   Number of children: Not on file   Years of education: Not on file   Highest education level: Not on file  Occupational History   Not on file   Tobacco Use   Smoking status: Never   Smokeless tobacco: Never  Vaping Use   Vaping status: Never Used  Substance and Sexual Activity   Alcohol use: Never    Alcohol/week: 0.0 standard drinks of alcohol   Drug use: Never   Sexual activity: Not on file  Other Topics Concern   Not on file  Social History Narrative   Right handed   Two story home   Drinks caffeine   Social Drivers of Health   Financial Resource Strain: Not on file  Food Insecurity: Low Risk  (04/25/2024)   Received from [facility]   Hunger Vital Sign    Within the past 12 months, you worried that your food would run out before you got money to buy more: Never true    Within the past 12 months, the food you bought just didn't last and you didn't have money to get more. : Never true  Transportation Needs: No Transportation Needs (04/25/2024)   Received from [facility]    In the past 12 months, has lack of reliable transportation kept you from medical appointments, meetings, work or from getting things needed for daily living? : No  Physical Activity: Not on file  Stress: Not on file  Social Connections: Not on file  Allergies  Allergen Reactions   Gabapentin  Nausea And Vomiting   Meloxicam     Penicillins Rash   Family History  Problem Relation Age of Onset   Heart disease Mother    Healthy Brother     Current Outpatient Medications (Endocrine & Metabolic):    insulin  aspart (NOVOLOG  FLEXPEN) 100 UNIT/ML FlexPen, Inject 8 Units into the skin 3 (three) times daily with meals.   insulin  glargine (LANTUS ) 100 UNIT/ML Solostar Pen, Inject 28 Units into the skin daily.  Current Outpatient Medications (Cardiovascular):    hydrochlorothiazide  (HYDRODIURIL ) 25 MG tablet, Take 12.5 mg by mouth every other day.   losartan  (COZAAR ) 50 MG tablet, Take 1 tablet (50 mg total) by mouth daily.   rosuvastatin  (CRESTOR ) 20 MG tablet, Take 20 mg by mouth daily.   Current Outpatient Medications  (Analgesics):    Adalimumab (HUMIRA) 40 MG/0.4ML PSKT, Inject 40 mg into the skin every 14 (fourteen) days.    colchicine  0.6 MG tablet, Take 1 tablet (0.6 mg total) by mouth daily.   Current Outpatient Medications (Other):    azaTHIOprine  (IMURAN ) 50 MG tablet, Take 100 mg by mouth daily.   bimatoprost (LUMIGAN) 0.03 % ophthalmic solution, Place 1 drop into both eyes at bedtime.    Blood Glucose Monitoring Suppl DEVI, 1 each by Does not apply route in the morning, at noon, and at bedtime. May substitute to any manufacturer covered by patient's insurance.   clobetasol ointment (TEMOVATE) 0.05 %, Apply 1 Application topically 2 (two) times daily as needed (alopecia).   COMBIGAN  0.2-0.5 % ophthalmic solution, Place 1 drop into both eyes 2 (two) times daily.    Glucose Blood (BLOOD GLUCOSE TEST STRIPS) STRP, 1 each by In Vitro route in the morning, at noon, and at bedtime. May substitute to any manufacturer covered by patient's insurance.   Insulin  Pen Needle 30G X 5 MM MISC, 1 each by Does not apply route 3 (three) times daily.   Multiple Vitamin (MULTIVITAMIN PO), Take 1 tablet by mouth daily.    ondansetron  (ZOFRAN ) 4 MG tablet, Take 1 tablet (4 mg total) by mouth every 8 (eight) hours as needed for nausea or vomiting.   PENNSAID  2 % SOLN, APPLY 2 PUMPS TOPICALLY TO THE AFFECTED AREA(S) TWICE DAILY AS DIRECTED   tacrolimus  (PROGRAF ) 1 MG capsule, Take 2 mg by mouth 2 (two) times daily.    tacrolimus  (PROTOPIC ) 0.1 % ointment, Apply 1 Application topically 2 (two) times a week.   Travoprost, BAK Free, (TRAVATAN) 0.004 % SOLN ophthalmic solution, Place 1 drop into both eyes at bedtime.   Reviewed prior external information including notes and imaging from  primary care provider As well as notes that were available from care everywhere and other healthcare systems.  Past medical history, social, surgical and family history all reviewed in electronic medical record.  No pertanent information  unless stated regarding to the chief complaint.   Review of Systems:  No headache, visual changes, nausea, vomiting, diarrhea, constipation, dizziness, abdominal pain, skin rash, fevers, chills, night sweats, weight loss, swollen lymph nodes, body aches, joint swelling, chest pain, shortness of breath, mood changes. POSITIVE muscle aches  Objective  Blood pressure 110/66, height 5' 3 (1.6 m), weight 176 lb (79.8 kg).   General: No apparent distress alert and oriented x3 mood and affect normal, dressed appropriately.  HEENT: Pupils equal, extraocular movements intact  Respiratory: Patient's speak in full sentences and does not appear short of breath  Cardiovascular: No lower extremity edema, non  tender, no erythema  Right hip shows that patient does have relatively good range of motion but severely tender to palpation over the greater trochanteric area.  Fairly severe.  On the right side.  Negative straight leg test noted.  Postsurgical changes from patient's hip replacement noted.  Procedure: Real-time Ultrasound Guided Injection of right greater trochanteric Device: GE Logiq Q7  Ultrasound guided injection is preferred based studies that show increased duration, increased effect, greater accuracy, decreased procedural pain, increased response rate with ultrasound guided versus blind injection.  Patient also has a replacement. Verbal informed consent obtained.  Time-out conducted.  Noted no overlying erythema, induration, or other signs of local infection.  Skin prepped in a sterile fashion.  Local anesthesia: Topical Ethyl chloride.  With sterile technique and under real time ultrasound guidance:  Anterior capsule visualized, needle visualized going superficial to the hip replacement. Pictures taken. Patient did have injection of  3 cc of 0.5% Marcaine , and 1 cc of Kenalog  40 mg/dL. Completed without difficulty  Pain immediately resolved suggesting accurate placement of the medication.   Advised to call if fevers/chills, erythema, induration, drainage, or persistent bleeding.  Images permanently stored  Impression: Technically successful ultrasound guided injection.    Impression and Recommendations:    The above documentation has been reviewed and is accurate and complete Tavonte Seybold M Jaisen Wiltrout, DO

## 2024-05-21 ENCOUNTER — Other Ambulatory Visit: Payer: Self-pay | Admitting: Physician Assistant

## 2024-05-24 ENCOUNTER — Ambulatory Visit: Admitting: Family Medicine

## 2024-05-24 ENCOUNTER — Other Ambulatory Visit: Payer: Self-pay

## 2024-05-24 ENCOUNTER — Encounter: Payer: Self-pay | Admitting: Family Medicine

## 2024-05-24 VITALS — BP 110/66 | Ht 63.0 in | Wt 176.0 lb

## 2024-05-24 DIAGNOSIS — G8929 Other chronic pain: Secondary | ICD-10-CM

## 2024-05-24 DIAGNOSIS — M25512 Pain in left shoulder: Secondary | ICD-10-CM | POA: Diagnosis not present

## 2024-05-24 DIAGNOSIS — M7061 Trochanteric bursitis, right hip: Secondary | ICD-10-CM

## 2024-05-24 NOTE — Patient Instructions (Addendum)
 Injected R GT Start walking on Monday See me again in 3 months

## 2024-05-24 NOTE — Assessment & Plan Note (Signed)
 Repeat injection given again today.  Tolerated the procedure well.  Discussed with patient about icing regimen and home exercises.  Has been over 2 months since previous injection.  Differential includes lumbar radiculopathy and may need to continue to monitor.  Follow-up with me in 6 to 8 weeks otherwise.

## 2024-05-24 NOTE — Telephone Encounter (Signed)
 Requested Prescriptions  Refused Prescriptions Disp Refills   colchicine  0.6 MG tablet [Pharmacy Med Name: COLCHICINE  0.6 MG TABLET] 90 tablet 1    Sig: TAKE 1 TABLET BY MOUTH EVERY DAY     Endocrinology:  Gout Agents - colchicine  Failed - 05/24/2024 11:28 AM      Failed - Valid encounter within last 12 months    Recent Outpatient Visits   None            Failed - CBC within normal limits and completed in the last 12 months    WBC  Date Value Ref Range Status  10/04/2023 6.5 4.0 - 10.5 K/uL Final   RBC  Date Value Ref Range Status  10/04/2023 4.57 3.87 - 5.11 MIL/uL Final   Hemoglobin  Date Value Ref Range Status  10/04/2023 14.7 12.0 - 15.0 g/dL Final   HCT  Date Value Ref Range Status  10/04/2023 43.7 36.0 - 46.0 % Final   MCHC  Date Value Ref Range Status  10/04/2023 33.6 30.0 - 36.0 g/dL Final   [facility]  Date Value Ref Range Status  10/04/2023 32.2 26.0 - 34.0 pg Final   MCV  Date Value Ref Range Status  10/04/2023 95.6 80.0 - 100.0 fL Final   No results found for: PLTCOUNTKUC, LABPLAT, POCPLA RDW  Date Value Ref Range Status  10/04/2023 13.7 11.5 - 15.5 % Final         Passed - Cr in normal range and within 360 days    Creatinine, Ser  Date Value Ref Range Status  10/04/2023 0.96 0.44 - 1.00 mg/dL Final         Passed - ALT in normal range and within 360 days    ALT  Date Value Ref Range Status  10/04/2023 16 0 - 44 U/L Final         Passed - AST in normal range and within 360 days    AST  Date Value Ref Range Status  10/04/2023 29 15 - 41 U/L Final

## 2024-08-17 NOTE — Progress Notes (Deleted)
 " Darly[facility] [address] [place] [address] Phone: [phone] Subjective:    I'm seeing this patient by the request  of:  [name]., MD  CC:   YEP:Dlagzrupcz  05/24/2024 Repeat injection given again today.  Tolerated the procedure well.  Discussed with patient about icing regimen and home exercises.  Has been over 2 months since previous injection.  Differential includes lumbar radiculopathy and may need to continue to monitor.  Follow-up with me in 6 to 8 weeks otherwise.      Update 08/23/2024 Olivia Barnes is a 68 y.o. female coming in with complaint of L shoulder and R hip pain. 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    TOENAIL EXCISION Left 12/2020   2nd digit    TOTAL KNEE ARTHROPLASTY Left 07/27/2020   Procedure: LEFT TOTAL KNEE ARTHROPLASTY;  Surgeon: Vernetta Lonni GRADE, MD;  Location: [facility];  Service: Orthopedics;  Laterality: Left;   Social History   Socioeconomic History   Marital status: Married    Spouse name: Not on file   Number of children: Not on file   Years of education: Not on file   Highest education level: Not on file  Occupational History   Not on file  Tobacco Use   Smoking status: Never   Smokeless tobacco: Never  Vaping Use   Vaping status: Never Used  Substance and Sexual Activity   Alcohol use: Never    Alcohol/week: 0.0 standard drinks of alcohol   Drug use: Never   Sexual activity: Not on file  Other Topics Concern   Not on file  Social History Narrative   Right handed   Two story home   Drinks caffeine   Social Drivers of Health   Tobacco Use: Low Risk (08/12/2024)   Received from [facility]   Patient History    Smoking  Tobacco Use: Never    Smokeless Tobacco Use: Never    Passive Exposure: Not on file  Financial Resource Strain: Not on file  Food Insecurity: Low Risk (04/25/2024)   Received from [facility]   Epic    Within the past 12 months, you worried that your food would run out before you got money to buy more: Never true    Within the past 12 months, the food you bought just didn't last and you didn't have money to get more. : Never true  Transportation Needs: No Transportation Needs (04/25/2024)   Received from [facility]    In the past 12 months, has lack of reliable transportation kept you from medical appointments, meetings, work or from getting things needed for daily living? : No  Physical Activity: Not on file  Stress: Not on file  Social Connections: Not on file  Depression (PHQ2-9): Low Risk (12/30/2022)   Depression (PHQ2-9)    PHQ-2 Score: 0  Alcohol Screen: Not on file  Housing: Low Risk (04/25/2024)   Received from [facility]   Epic    What is your living situation today?: I have a steady place to live    Think about the place you live. Do  you have problems with any of the following? Choose all that apply:: None/None on this list  Utilities: Low Risk (04/25/2024)   Received from [facility]   Utilities    In the past 12 months has the electric, gas, oil, or water  company threatened to shut off services in your home? : No  Health Literacy: Not on file   Allergies[1] Family History  Problem Relation Age of Onset   Heart disease Mother    Healthy Brother     Current Outpatient Medications (Endocrine & Metabolic):    insulin  aspart (NOVOLOG  FLEXPEN) 100 UNIT/ML FlexPen, Inject 8 Units into the skin 3 (three) times daily with meals.   insulin  glargine (LANTUS ) 100 UNIT/ML Solostar Pen, Inject 28 Units into the skin daily.  Current Outpatient Medications (Cardiovascular):    hydrochlorothiazide  (HYDRODIURIL ) 25 MG tablet, Take 12.5 mg by mouth every other  day.   losartan  (COZAAR ) 50 MG tablet, Take 1 tablet (50 mg total) by mouth daily.   rosuvastatin  (CRESTOR ) 20 MG tablet, Take 20 mg by mouth daily.  Current Outpatient Medications (Analgesics):    Adalimumab (HUMIRA) 40 MG/0.4ML PSKT, Inject 40 mg into the skin every 14 (fourteen) days.    colchicine  0.6 MG tablet, Take 1 tablet (0.6 mg total) by mouth daily.  Current Outpatient Medications (Other):    azaTHIOprine  (IMURAN ) 50 MG tablet, Take 100 mg by mouth daily.   bimatoprost (LUMIGAN) 0.03 % ophthalmic solution, Place 1 drop into both eyes at bedtime.    Blood Glucose Monitoring Suppl DEVI, 1 each by Does not apply route in the morning, at noon, and at bedtime. May substitute to any manufacturer covered by patient's insurance.   clobetasol ointment (TEMOVATE) 0.05 %, Apply 1 Application topically 2 (two) times daily as needed (alopecia).   COMBIGAN  0.2-0.5 % ophthalmic solution, Place 1 drop into both eyes 2 (two) times daily.    Glucose Blood (BLOOD GLUCOSE TEST STRIPS) STRP, 1 each by In Vitro route in the morning, at noon, and at bedtime. May substitute to any manufacturer covered by patient's insurance.   Insulin  Pen Needle 30G X 5 MM MISC, 1 each by Does not apply route 3 (three) times daily.   Multiple Vitamin (MULTIVITAMIN PO), Take 1 tablet by mouth daily.    ondansetron  (ZOFRAN ) 4 MG tablet, Take 1 tablet (4 mg total) by mouth every 8 (eight) hours as needed for nausea or vomiting.   PENNSAID  2 % SOLN, APPLY 2 PUMPS TOPICALLY TO THE AFFECTED AREA(S) TWICE DAILY AS DIRECTED   tacrolimus  (PROGRAF ) 1 MG capsule, Take 2 mg by mouth 2 (two) times daily.    tacrolimus  (PROTOPIC ) 0.1 % ointment, Apply 1 Application topically 2 (two) times a week.   Travoprost, BAK Free, (TRAVATAN) 0.004 % SOLN ophthalmic solution, Place 1 drop into both eyes at bedtime.   Reviewed prior external information including notes and imaging from  primary care provider As well as notes that were available  from care everywhere and other healthcare systems.  Past medical history, social, surgical and family history all reviewed in electronic medical record.  No pertanent information unless stated regarding to the chief complaint.   Review of Systems:  No headache, visual changes, nausea, vomiting, diarrhea, constipation, dizziness, abdominal pain, skin rash, fevers, chills, night sweats, weight loss, swollen lymph nodes, body aches, joint swelling, chest pain, shortness of breath, mood changes. POSITIVE muscle aches  Objective  There were no vitals taken for this visit.   General: No apparent distress  alert and oriented x3 mood and affect normal, dressed appropriately.  HEENT: Pupils equal, extraocular movements intact  Respiratory: Patient's speak in full sentences and does not appear short of breath  Cardiovascular: No lower extremity edema, non tender, no erythema      Impression and Recommendations:           [1]  Allergies Allergen Reactions   Gabapentin  Nausea And Vomiting   Meloxicam     Penicillins Rash   "

## 2024-08-23 ENCOUNTER — Ambulatory Visit: Admitting: Family Medicine

## 2024-09-28 NOTE — Progress Notes (Unsigned)
 " Olivia Barnes [facility] [address] [place] [address] Phone: [phone] Subjective:    I'm seeing this patient by the request  of:  [name]., MD  CC: New onset of right hand pain and right knee pain  YEP:Dlagzrupcz  05/24/2024 Repeat injection given again today.  Tolerated the procedure well.  Discussed with patient about icing regimen and home exercises.  Has been over 2 months since previous injection.  Differential includes lumbar radiculopathy and may need to continue to monitor.  Follow-up with me in 6 to 8 weeks otherwise.      Update 09/30/2024 Olivia Barnes is a 69 y.o. female coming in with complaint of R hip and L shoulder pain. Patient states that her R knee over lateral aspect. Shoulder and hip have been doing well.   Also having pain over dorsal aspect of R hand over metacarpals. Pain with use and at rest. Denies any nerve symptoms. No hx of pain in her hand.        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    TOENAIL EXCISION Left 12/2020   2nd digit    TOTAL KNEE ARTHROPLASTY Left 07/27/2020   Procedure: LEFT TOTAL KNEE ARTHROPLASTY;  Surgeon: Vernetta Lonni GRADE, MD;  Location: [facility];  Service: Orthopedics;  Laterality: Left;   Social History   Socioeconomic History   Marital status: Married    Spouse name: Not on file   Number of children: Not on file   Years of education: Not on file   Highest education level: Not on file  Occupational History   Not on file  Tobacco Use   Smoking status: Never   Smokeless tobacco: Never  Vaping Use   Vaping status: Never Used  Substance and Sexual Activity   Alcohol use: Never    Alcohol/week: 0.0 standard drinks of alcohol   Drug use: Never    Sexual activity: Not on file  Other Topics Concern   Not on file  Social History Narrative   Right handed   Two story home   Drinks caffeine   Social Drivers of Health   Tobacco Use: Low Risk (09/13/2024)   Received from [facility]   Patient History    Smoking Tobacco Use: Never    Smokeless Tobacco Use: Never    Passive Exposure: Not on file  Financial Resource Strain: Not on file  Food Insecurity: Low Risk (04/25/2024)   Received from [facility]   Epic    Within the past 12 months, you worried that your food would run out before you got money to buy more: Never true    Within the past 12 months, the food you bought just didn't last and you didn't have money to get more. : Never true  Transportation Needs: No Transportation Needs (04/25/2024)   Received from [facility]    In the past 12 months, has lack of reliable transportation kept you from medical appointments, meetings, work or from getting things needed for daily living? : No  Physical Activity: Not on file  Stress: Not on file  Social Connections: Not on file  Depression (PHQ2-9): Low Risk (12/30/2022)  Depression (PHQ2-9)    PHQ-2 Score: 0  Alcohol Screen: Not on file  Housing: Low Risk (04/25/2024)   Received from [facility]   Epic    What is your living situation today?: I have a steady place to live    Think about the place you live. Do you have problems with any of the following? Choose all that apply:: None/None on this list  Utilities: Low Risk (04/25/2024)   Received from [facility]   Utilities    In the past 12 months has the electric, gas, oil, or water  company threatened to shut off services in your home? : No  Health Literacy: Not on file   Allergies[1] Family History  Problem Relation Age of Onset   Heart disease Mother    Healthy Brother     Current Outpatient Medications (Endocrine & Metabolic):    insulin  aspart (NOVOLOG  FLEXPEN) 100 UNIT/ML FlexPen, Inject 8 Units into  the skin 3 (three) times daily with meals.   insulin  glargine (LANTUS ) 100 UNIT/ML Solostar Pen, Inject 28 Units into the skin daily.  Current Outpatient Medications (Cardiovascular):    hydrochlorothiazide  (HYDRODIURIL ) 25 MG tablet, Take 12.5 mg by mouth every other day.   losartan  (COZAAR ) 50 MG tablet, Take 1 tablet (50 mg total) by mouth daily.   rosuvastatin  (CRESTOR ) 20 MG tablet, Take 20 mg by mouth daily.  Current Outpatient Medications (Analgesics):    Adalimumab (HUMIRA) 40 MG/0.4ML PSKT, Inject 40 mg into the skin every 14 (fourteen) days.    colchicine  0.6 MG tablet, Take 1 tablet (0.6 mg total) by mouth daily.  Current Outpatient Medications (Other):    azaTHIOprine  (IMURAN ) 50 MG tablet, Take 100 mg by mouth daily.   bimatoprost (LUMIGAN) 0.03 % ophthalmic solution, Place 1 drop into both eyes at bedtime.    Blood Glucose Monitoring Suppl DEVI, 1 each by Does not apply route in the morning, at noon, and at bedtime. May substitute to any manufacturer covered by patient's insurance.   clobetasol ointment (TEMOVATE) 0.05 %, Apply 1 Application topically 2 (two) times daily as needed (alopecia).   COMBIGAN  0.2-0.5 % ophthalmic solution, Place 1 drop into both eyes 2 (two) times daily.    Glucose Blood (BLOOD GLUCOSE TEST STRIPS) STRP, 1 each by In Vitro route in the morning, at noon, and at bedtime. May substitute to any manufacturer covered by patient's insurance.   Insulin  Pen Needle 30G X 5 MM MISC, 1 each by Does not apply route 3 (three) times daily.   Multiple Vitamin (MULTIVITAMIN PO), Take 1 tablet by mouth daily.    ondansetron  (ZOFRAN ) 4 MG tablet, Take 1 tablet (4 mg total) by mouth every 8 (eight) hours as needed for nausea or vomiting.   PENNSAID  2 % SOLN, APPLY 2 PUMPS TOPICALLY TO THE AFFECTED AREA(S) TWICE DAILY AS DIRECTED   tacrolimus  (PROGRAF ) 1 MG capsule, Take 2 mg by mouth 2 (two) times daily.    tacrolimus  (PROTOPIC ) 0.1 % ointment, Apply 1 Application  topically 2 (two) times a week.   Travoprost, BAK Free, (TRAVATAN) 0.004 % SOLN ophthalmic solution, Place 1 drop into both eyes at bedtime.   Reviewed prior external information including notes and imaging from  primary care provider As well as notes that were available from care everywhere and other healthcare systems.  Past medical history, social, surgical and family history all reviewed in electronic medical record.  No pertanent information unless stated regarding to the chief complaint.   Review of Systems:  No headache, visual changes, nausea, vomiting, diarrhea, constipation, dizziness, abdominal pain, skin rash, fevers, chills, night sweats, weight loss, swollen lymph nodes, body aches, joint swelling, chest pain, shortness of breath, mood changes. POSITIVE muscle aches  Objective  Blood pressure (!) 110/58, pulse 68, height 5' 3 (1.6 m), weight 185 lb (83.9 kg), SpO2 98%.   General: No apparent distress alert and oriented x3 mood and affect normal, dressed appropriately.  HEENT: Pupils equal, extraocular movements intact  Respiratory: Patient's speak in full sentences and does not appear short of breath  Cardiovascular: No lower extremity edema, non tender, no erythema  Right hand exam shows some swelling of the MCPs of the 2nd and 3rd fingers.  Patient does have full range of motion but is tender in that area.  Right knee does have a trace effusion noted.  Lacks the last 5 degrees of extension or less 5 degrees of flexion.  Tightness noted.  After informed written and verbal consent, patient was seated on exam table. Right knee was prepped with alcohol swab and utilizing anterolateral approach, patient's right knee space was injected with 4:1  marcaine  0.5%: Kenalog  40mg /dL. Patient tolerated the procedure well without immediate complications.  Limited muscular skeletal ultrasound was performed and interpreted by Olivia Barnes, M  Limited ultrasound shows patient does have  some very mild synovitis noted with some increase in Doppler flow of the 2nd and 3rd MCPs.  No significant cortical irregularity noted. Impression: Mild synovitis   Impression and Recommendations:     The above documentation has been reviewed and is accurate and complete Barnes Olivia Barnes Claudene, DO       [1]  Allergies Allergen Reactions   Gabapentin  Nausea And Vomiting   Meloxicam     Penicillins Rash   "

## 2024-09-30 ENCOUNTER — Ambulatory Visit: Admitting: Family Medicine

## 2024-09-30 ENCOUNTER — Other Ambulatory Visit: Payer: Self-pay

## 2024-09-30 ENCOUNTER — Encounter: Payer: Self-pay | Admitting: Family Medicine

## 2024-09-30 VITALS — BP 110/58 | HR 68 | Ht 63.0 in | Wt 185.0 lb

## 2024-09-30 DIAGNOSIS — G8929 Other chronic pain: Secondary | ICD-10-CM

## 2024-09-30 DIAGNOSIS — M1711 Unilateral primary osteoarthritis, right knee: Secondary | ICD-10-CM

## 2024-09-30 DIAGNOSIS — M19041 Primary osteoarthritis, right hand: Secondary | ICD-10-CM | POA: Insufficient documentation

## 2024-09-30 NOTE — Patient Instructions (Addendum)
 Injection in R knee today Gloves at night Gloves when less than 40 degrees See me again in 10-12 weeks

## 2024-09-30 NOTE — Assessment & Plan Note (Signed)
 Arthritis of the MCP.  Mild synovitis on ultrasound, nothing severe.  Will monitor.  Discussed labs.  Worsening pain injections.

## 2024-09-30 NOTE — Assessment & Plan Note (Signed)
 Has been a significant amount of time.  Discussed icing regimen and home exercises, which activities to do and which ones to avoid.  Increase activity slowly.  Discussed icing regimen.  Follow-up again in 6 to 12 weeks.

## 2024-12-09 ENCOUNTER — Ambulatory Visit: Admitting: Family Medicine
